# Patient Record
Sex: Female | Born: 1980 | Race: White | Hispanic: No | Marital: Married | Smoking: Never smoker
Health system: Southern US, Community
[De-identification: ages and names within clinical notes are randomized; demographics above are authoritative.]

## PROBLEM LIST (undated history)

## (undated) DIAGNOSIS — R200 Anesthesia of skin: Secondary | ICD-10-CM

## (undated) DIAGNOSIS — B379 Candidiasis, unspecified: Secondary | ICD-10-CM

## (undated) DIAGNOSIS — Z8742 Personal history of other diseases of the female genital tract: Secondary | ICD-10-CM

## (undated) DIAGNOSIS — R109 Unspecified abdominal pain: Secondary | ICD-10-CM

## (undated) DIAGNOSIS — Z3490 Encounter for supervision of normal pregnancy, unspecified, unspecified trimester: Secondary | ICD-10-CM

## (undated) DIAGNOSIS — O039 Complete or unspecified spontaneous abortion without complication: Secondary | ICD-10-CM

## (undated) DIAGNOSIS — Z309 Encounter for contraceptive management, unspecified: Secondary | ICD-10-CM

## (undated) DIAGNOSIS — O26899 Other specified pregnancy related conditions, unspecified trimester: Secondary | ICD-10-CM

## (undated) DIAGNOSIS — K649 Unspecified hemorrhoids: Secondary | ICD-10-CM

## (undated) DIAGNOSIS — IMO0002 Reserved for concepts with insufficient information to code with codable children: Secondary | ICD-10-CM

## (undated) DIAGNOSIS — N898 Other specified noninflammatory disorders of vagina: Secondary | ICD-10-CM

## (undated) DIAGNOSIS — O209 Hemorrhage in early pregnancy, unspecified: Secondary | ICD-10-CM

## (undated) DIAGNOSIS — Z87440 Personal history of urinary (tract) infections: Secondary | ICD-10-CM

## (undated) DIAGNOSIS — R87619 Unspecified abnormal cytological findings in specimens from cervix uteri: Secondary | ICD-10-CM

## (undated) DIAGNOSIS — R87629 Unspecified abnormal cytological findings in specimens from vagina: Secondary | ICD-10-CM

## (undated) HISTORY — DX: Reserved for concepts with insufficient information to code with codable children: IMO0002

## (undated) HISTORY — DX: Other specified noninflammatory disorders of vagina: N89.8

## (undated) HISTORY — DX: Unspecified abnormal cytological findings in specimens from vagina: R87.629

## (undated) HISTORY — PX: APPENDECTOMY: SHX54

## (undated) HISTORY — PX: WISDOM TOOTH EXTRACTION: SHX21

## (undated) HISTORY — DX: Unspecified abnormal cytological findings in specimens from cervix uteri: R87.619

## (undated) HISTORY — DX: Encounter for contraceptive management, unspecified: Z30.9

## (undated) HISTORY — DX: Anesthesia of skin: R20.0

## (undated) HISTORY — DX: Other specified pregnancy related conditions, unspecified trimester: O26.899

## (undated) HISTORY — DX: Unspecified abdominal pain: R10.9

## (undated) HISTORY — DX: Personal history of urinary (tract) infections: Z87.440

## (undated) HISTORY — DX: Hemorrhage in early pregnancy, unspecified: O20.9

## (undated) HISTORY — DX: Personal history of other diseases of the female genital tract: Z87.42

## (undated) HISTORY — DX: Encounter for supervision of normal pregnancy, unspecified, unspecified trimester: Z34.90

## (undated) HISTORY — DX: Candidiasis, unspecified: B37.9

## (undated) HISTORY — DX: Unspecified hemorrhoids: K64.9

## (undated) HISTORY — DX: Complete or unspecified spontaneous abortion without complication: O03.9

---
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
## 2004-09-01 ENCOUNTER — Ambulatory Visit (HOSPITAL_COMMUNITY): Admission: RE | Admit: 2004-09-01 | Discharge: 2004-09-01 | Payer: Self-pay | Admitting: Pediatrics

## 2006-01-16 ENCOUNTER — Ambulatory Visit: Payer: Self-pay | Admitting: Orthopedic Surgery

## 2006-03-13 ENCOUNTER — Ambulatory Visit: Payer: Self-pay | Admitting: Orthopedic Surgery

## 2007-03-25 ENCOUNTER — Other Ambulatory Visit: Admission: RE | Admit: 2007-03-25 | Discharge: 2007-03-25 | Payer: Self-pay | Admitting: Obstetrics and Gynecology

## 2008-03-30 ENCOUNTER — Other Ambulatory Visit: Admission: RE | Admit: 2008-03-30 | Discharge: 2008-03-30 | Payer: Self-pay | Admitting: Obstetrics & Gynecology

## 2008-08-29 ENCOUNTER — Emergency Department (HOSPITAL_COMMUNITY): Admission: EM | Admit: 2008-08-29 | Discharge: 2008-08-29 | Payer: Self-pay | Admitting: Emergency Medicine

## 2009-05-27 ENCOUNTER — Other Ambulatory Visit: Admission: RE | Admit: 2009-05-27 | Discharge: 2009-05-27 | Payer: Self-pay | Admitting: Obstetrics and Gynecology

## 2010-01-02 ENCOUNTER — Ambulatory Visit (HOSPITAL_COMMUNITY): Admission: RE | Admit: 2010-01-02 | Discharge: 2010-01-02 | Payer: Self-pay | Admitting: Pulmonary Disease

## 2010-07-03 LAB — DIFFERENTIAL
Basophils Absolute: 0 10*3/uL (ref 0.0–0.1)
Basophils Relative: 0 % (ref 0–1)
Eosinophils Relative: 0 % (ref 0–5)
Monocytes Absolute: 0.3 10*3/uL (ref 0.1–1.0)

## 2010-07-03 LAB — CBC
HCT: 42.9 % (ref 36.0–46.0)
Hemoglobin: 15.6 g/dL — ABNORMAL HIGH (ref 12.0–15.0)
MCHC: 36.4 g/dL — ABNORMAL HIGH (ref 30.0–36.0)
MCV: 86 fL (ref 78.0–100.0)
RDW: 13.1 % (ref 11.5–15.5)

## 2010-07-03 LAB — BASIC METABOLIC PANEL
CO2: 23 mEq/L (ref 19–32)
Glucose, Bld: 105 mg/dL — ABNORMAL HIGH (ref 70–99)
Potassium: 3.4 mEq/L — ABNORMAL LOW (ref 3.5–5.1)
Sodium: 135 mEq/L (ref 135–145)

## 2010-07-03 LAB — URINALYSIS, ROUTINE W REFLEX MICROSCOPIC
Bilirubin Urine: NEGATIVE
Hgb urine dipstick: NEGATIVE
Specific Gravity, Urine: 1.02 (ref 1.005–1.030)
Urobilinogen, UA: 0.2 mg/dL (ref 0.0–1.0)
pH: 7 (ref 5.0–8.0)

## 2011-06-05 ENCOUNTER — Other Ambulatory Visit: Payer: Self-pay | Admitting: Adult Health

## 2011-06-05 ENCOUNTER — Other Ambulatory Visit (HOSPITAL_COMMUNITY)
Admission: RE | Admit: 2011-06-05 | Discharge: 2011-06-05 | Disposition: A | Discharge status: Home / Self Care | Payer: BC Managed Care – PPO | Source: Ambulatory Visit | Attending: Obstetrics and Gynecology | Admitting: Obstetrics and Gynecology

## 2011-06-05 DIAGNOSIS — Z113 Encounter for screening for infections with a predominantly sexual mode of transmission: Secondary | ICD-10-CM | POA: Insufficient documentation

## 2011-06-05 DIAGNOSIS — Z01419 Encounter for gynecological examination (general) (routine) without abnormal findings: Secondary | ICD-10-CM | POA: Insufficient documentation

## 2012-06-15 ENCOUNTER — Encounter: Payer: Self-pay | Admitting: Adult Health

## 2012-06-15 DIAGNOSIS — Z87898 Personal history of other specified conditions: Secondary | ICD-10-CM

## 2012-06-15 DIAGNOSIS — R87619 Unspecified abnormal cytological findings in specimens from cervix uteri: Secondary | ICD-10-CM | POA: Insufficient documentation

## 2012-06-16 ENCOUNTER — Ambulatory Visit (INDEPENDENT_AMBULATORY_CARE_PROVIDER_SITE_OTHER): Payer: BC Managed Care – PPO | Admitting: Adult Health

## 2012-06-16 ENCOUNTER — Encounter: Payer: Self-pay | Admitting: Adult Health

## 2012-06-16 ENCOUNTER — Other Ambulatory Visit (HOSPITAL_COMMUNITY)
Admission: RE | Admit: 2012-06-16 | Discharge: 2012-06-16 | Disposition: A | Discharge status: Home / Self Care | Payer: BC Managed Care – PPO | Source: Ambulatory Visit | Attending: Obstetrics and Gynecology | Admitting: Obstetrics and Gynecology

## 2012-06-16 ENCOUNTER — Other Ambulatory Visit: Payer: Self-pay | Admitting: Adult Health

## 2012-06-16 VITALS — BP 118/76 | Ht 64.0 in | Wt 140.0 lb

## 2012-06-16 DIAGNOSIS — Z309 Encounter for contraceptive management, unspecified: Secondary | ICD-10-CM

## 2012-06-16 DIAGNOSIS — Z01419 Encounter for gynecological examination (general) (routine) without abnormal findings: Secondary | ICD-10-CM

## 2012-06-16 DIAGNOSIS — Z1151 Encounter for screening for human papillomavirus (HPV): Secondary | ICD-10-CM | POA: Insufficient documentation

## 2012-06-16 DIAGNOSIS — Z0000 Encounter for general adult medical examination without abnormal findings: Secondary | ICD-10-CM

## 2012-06-16 DIAGNOSIS — Z87898 Personal history of other specified conditions: Secondary | ICD-10-CM

## 2012-06-16 DIAGNOSIS — R87810 Cervical high risk human papillomavirus (HPV) DNA test positive: Secondary | ICD-10-CM | POA: Insufficient documentation

## 2012-06-16 HISTORY — DX: Encounter for contraceptive management, unspecified: Z30.9

## 2012-06-16 MED ORDER — NORGESTIMATE-ETH ESTRADIOL 0.25-35 MG-MCG PO TABS: 1.0000 | ORAL_TABLET | Freq: Every day | ORAL | Status: DC

## 2012-06-16 NOTE — Progress Notes (Signed)
Subjective:     Patient ID: Emily Chan, female   DOB: 15-Sep-1980, 32 y.o.   MRN: [id number]  HPI Emily Chan is a 32 year old white female single, gravida 0 para 0. In today for Pap and physical, and refill her birth control pills.   Review of Systems Emily Chan denies any headache, blurred vision, shortness of breath, chest pain, abdominal pain, problems with bowel movements, urination or intercourse. She denies any musculoskeletal pain. No mood changes. Reviewed past medical, surgical, social and family histories. Reviewed medications and allergies.     Objective:   Physical Exam Vital signs: Blood pressure 118/76, height 5 foot 4 inches, weight 140 lbs. BMI:24.03 .Last menstrual period last week. Skin: Warm and dry EENT:PERRL,No hearing loss,normal nares Neck: Midline trachea. Thyroid normal Lungs: Clear to auscultation bilaterally. Breasts: No dominant palpable mass, retraction, or nipple discharge. Cardiovascular: Regular rate and rhythm. Abdomen: Soft and non-tender, no hepatosplenomegaly. Pelvic: External genitalia is normal and appearance. The vagina is normal in appearance. Cervix is nulliparous with negative cervical motion tenderness. Uterus felt to be normal size shape and contour. No adnexal masses or tenderness noted.Extremities: No swelling or varicosiites noted.Psych.: No mood changes.    Assessment:     Yearly Exam Contraceptive Management History of abnormal Pap    Plan:     Continue Sprintec  Follow up 1 year for physical

## 2012-06-16 NOTE — Patient Instructions (Addendum)
Continue birth control pill, follow up 1 year for physical, sign up for my chart

## 2012-06-20 ENCOUNTER — Telehealth: Payer: Self-pay | Admitting: Adult Health

## 2012-06-20 NOTE — Telephone Encounter (Signed)
Called Emily Chan and told her that her pap smear showed the HPV virus and that she needed to have a pap next year and repeat the HPV again then.

## 2012-12-25 ENCOUNTER — Encounter: Payer: Self-pay | Admitting: Adult Health

## 2012-12-25 ENCOUNTER — Ambulatory Visit (INDEPENDENT_AMBULATORY_CARE_PROVIDER_SITE_OTHER): Payer: BC Managed Care – PPO | Admitting: Adult Health

## 2012-12-25 VITALS — BP 110/66 | Ht 64.0 in | Wt 139.0 lb

## 2012-12-25 DIAGNOSIS — N899 Noninflammatory disorder of vagina, unspecified: Secondary | ICD-10-CM

## 2012-12-25 DIAGNOSIS — B379 Candidiasis, unspecified: Secondary | ICD-10-CM | POA: Insufficient documentation

## 2012-12-25 DIAGNOSIS — N898 Other specified noninflammatory disorders of vagina: Secondary | ICD-10-CM

## 2012-12-25 HISTORY — DX: Candidiasis, unspecified: B37.9

## 2012-12-25 HISTORY — DX: Other specified noninflammatory disorders of vagina: N89.8

## 2012-12-25 LAB — POCT WET PREP (WET MOUNT)

## 2012-12-25 MED ORDER — NYSTATIN-TRIAMCINOLONE 100000-0.1 UNIT/GM-% EX OINT: TOPICAL_OINTMENT | Freq: Two times a day (BID) | CUTANEOUS | Status: DC

## 2012-12-25 MED ORDER — FLUCONAZOLE 150 MG PO TABS: ORAL_TABLET | ORAL | Status: DC

## 2012-12-25 NOTE — Progress Notes (Signed)
Subjective:     Patient ID: Emily Chan, female   DOB: 09-Dec-1980, 32 y.o.   MRN: [id number]  HPI Emily Chan is a 32 year old white female in complaining of vaginal irritation since Sunday when used different soap.Has been red and irritated, but better today.  Review of Systems See HPI Reviewed past medical,surgical, social and family history. Reviewed medications and allergies.     Objective:   Physical Exam BP 110/66  Ht 5\' 4"  (1.626 m)  Wt 139 lb (63.05 kg)  BMI 23.85 kg/m2  LMP 12/08/2012   Skin warm and dry.Pelvic: external genitalia is pale red in appearance, vagina: white discharge without odor, cervix:smooth, uterus: normal size, shape and contour, non tender, no masses felt, adnexa: no masses or tenderness noted. Wet prep: + for few yeast and +WBCs.   Assessment:     Vaginal irritation Yeast    Plan:      Rx diflucan 150 mg 1 now and 1 in 3 days if needed Rx mytrex cream use bid prn Follow up prn Review handout on yeast   Go back to old soap

## 2012-12-25 NOTE — Patient Instructions (Addendum)
Monilial Vaginitis Vaginitis in a soreness, swelling and redness (inflammation) of the vagina and vulva. Monilial vaginitis is not a sexually transmitted infection. CAUSES  Yeast vaginitis is caused by yeast (candida) that is normally found in your vagina. With a yeast infection, the candida has overgrown in number to a point that upsets the chemical balance. SYMPTOMS   White, thick vaginal discharge.  Swelling, itching, redness and irritation of the vagina and possibly the lips of the vagina (vulva).  Burning or painful urination.  Painful intercourse. DIAGNOSIS  Things that may contribute to monilial vaginitis are:  Postmenopausal and virginal states.  Pregnancy.  Infections.  Being tired, sick or stressed, especially if you had monilial vaginitis in the past.  Diabetes. Good control will help lower the chance.  Birth control pills.  Tight fitting garments.  Using bubble bath, feminine sprays, douches or deodorant tampons.  Taking certain medications that kill germs (antibiotics).  Sporadic recurrence can occur if you become ill. TREATMENT  Your caregiver will give you medication.  There are several kinds of anti monilial vaginal creams and suppositories specific for monilial vaginitis. For recurrent yeast infections, use a suppository or cream in the vagina 2 times a week, or as directed.  Anti-monilial or steroid cream for the itching or irritation of the vulva may also be used. Get your caregiver's permission.  Painting the vagina with methylene blue solution may help if the monilial cream does not work.  Eating yogurt may help prevent monilial vaginitis. HOME CARE INSTRUCTIONS   Finish all medication as prescribed.  Do not have sex until treatment is completed or after your caregiver tells you it is okay.  Take warm sitz baths.  Do not douche.  Do not use tampons, especially scented ones.  Wear cotton underwear.  Avoid tight pants and panty  hose.  Tell your sexual partner that you have a yeast infection. They should go to their caregiver if they have symptoms such as mild rash or itching.  Your sexual partner should be treated as well if your infection is difficult to eliminate.  Practice safer sex. Use condoms.  Some vaginal medications cause latex condoms to fail. Vaginal medications that harm condoms are:  Cleocin cream.  Butoconazole (Femstat).  Terconazole (Terazol) vaginal suppository.  Miconazole (Monistat) (may be purchased over the counter). SEEK MEDICAL CARE IF:   You have a temperature by mouth above 102 F (38.9 C).  The infection is getting worse after 2 days of treatment.  The infection is not getting better after 3 days of treatment.  You develop blisters in or around your vagina.  You develop vaginal bleeding, and it is not your menstrual period.  You have pain when you urinate.  You develop intestinal problems.  You have pain with sexual intercourse. Document Released: 12/20/2004 Document Revised: 06/04/2011 Document Reviewed: 09/03/2008 [facility] Patient Information 2014 [place], [place]. F/u prn

## 2013-03-22 ENCOUNTER — Ambulatory Visit (INDEPENDENT_AMBULATORY_CARE_PROVIDER_SITE_OTHER): Payer: BC Managed Care – PPO | Admitting: Family Medicine

## 2013-03-22 VITALS — BP 120/80 | HR 94 | Temp 98.7°F | Resp 16 | Ht 64.0 in | Wt 142.0 lb

## 2013-03-22 DIAGNOSIS — J029 Acute pharyngitis, unspecified: Secondary | ICD-10-CM

## 2013-03-22 DIAGNOSIS — R6889 Other general symptoms and signs: Secondary | ICD-10-CM

## 2013-03-22 DIAGNOSIS — J101 Influenza due to other identified influenza virus with other respiratory manifestations: Secondary | ICD-10-CM

## 2013-03-22 LAB — POCT INFLUENZA A/B
Influenza A, POC: POSITIVE
Influenza B, POC: NEGATIVE

## 2013-03-22 MED ORDER — HYDROCOD POLST-CHLORPHEN POLST 10-8 MG/5ML PO LQCR: 5.0000 mL | Freq: Two times a day (BID) | ORAL | Status: DC | PRN

## 2013-03-22 MED ORDER — OSELTAMIVIR PHOSPHATE 75 MG PO CAPS: 75.0000 mg | ORAL_CAPSULE | Freq: Two times a day (BID) | ORAL | Status: DC

## 2013-03-22 MED ORDER — IPRATROPIUM BROMIDE 0.03 % NA SOLN: 2.0000 | Freq: Four times a day (QID) | NASAL | Status: DC

## 2013-03-22 NOTE — Patient Instructions (Signed)

## 2013-03-22 NOTE — Progress Notes (Signed)
Subjective:  This chart was scribed for Norberto Sorenson, MD by Carl Best, Medical Scribe. This patient was seen in Room 4 and the patient's care was started at 1:33 PM.   Patient ID: Emily Chan, female    DOB: [birth year], 32 y.o.   MRN: [id number] Chief Complaint  Patient presents with  . Sore Throat    x 2 days    HPI HPI Comments: Emily Chan is a 32 y.o. female who presents to the [facility] complaining of constant right-sided sore throat that started two nights ago.  She states that her symptoms started with rhinorrhea and a scratchy throat.  She states that she had a 99 degree fever yesterday which has subsided.  She lists productive cough, headache, decreased appetite and myalgias as associated symptoms.  She denies chills, nausea, emesis as associated symptoms and is tol fluids well, nml GU.  She states that she has been taking Nyquil, Dayquil, and Ibuprofen with mild relief to her symptoms.    The patient states that she has been in contact with people with strep throat.  She denies obtaining her flu shot this year.    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   She states that she has Valtrex at home as gets cold sores with illnesses freq.  Past Medical History  Diagnosis Date  . Abnormal Pap smear   . Hx: UTI (urinary tract infection)   . Contraception management 06/16/2012  . Vaginal irritation 12/25/2012  . Yeast infection 12/25/2012   Past Surgical History  Procedure Laterality Date  . Appendectomy     Family History  Problem Relation Age of Onset  . Cancer Paternal Aunt     melenoma  . Diabetes Maternal Grandmother   . Hypertension Maternal Grandmother   . Cancer Maternal Grandfather     Brain and Lung  . Diabetes Maternal Grandfather   . Cancer Paternal Grandmother     breast   History   Social History  . Marital Status: Single    Spouse Name: N/A    Number of Children: N/A  . Years of Education: N/A     Occupational History  . Not on file.   Social History Main Topics  . Smoking status: Never Smoker   . Smokeless tobacco: Never Used  . Alcohol Use: Yes     Comment: occ.  . Drug Use: No  . Sexual Activity: Yes    Birth Control/ Protection: Pill   Other Topics Concern  . Not on file   Social History Narrative  . No narrative on file   Allergies  Allergen Reactions  . Sulfa Antibiotics      Review of Systems  Constitutional: Positive for fever and appetite change (decreased). Negative for chills.  HENT: Positive for congestion, rhinorrhea and sore throat. Negative for ear pain, sinus pressure, sneezing and trouble swallowing.   Respiratory: Positive for cough.   Gastrointestinal: Negative for nausea and vomiting.  Genitourinary: Negative for dysuria, urgency, decreased urine volume and difficulty urinating.  Musculoskeletal: Positive for myalgias. Negative for arthralgias.  Neurological: Positive for headaches.  Hematological: Positive for adenopathy.  Psychiatric/Behavioral: Positive for sleep disturbance.    BP 120/80  Pulse 94  Temp(Src) 98.7 F (37.1 C) (Oral)  Resp 16  Ht 5\' 4"  (1.626 m)  Wt 142 lb (64.411 kg)  BMI 24.36 kg/m2  SpO2 100%  LMP 03/01/2013 Objective:  Physical Exam  Nursing note and  vitals reviewed. Constitutional: She is oriented to person, place, and time. She appears well-developed and well-nourished. No distress.  HENT:  Head: Normocephalic and atraumatic.  Right Ear: External ear and ear canal normal. Tympanic membrane is erythematous and bulging. A middle ear effusion is present.  Left Ear: Tympanic membrane, external ear and ear canal normal.  Mouth/Throat: Uvula is midline. Posterior oropharyngeal erythema present.  Eyes: Conjunctivae and EOM are normal. Pupils are equal, round, and reactive to light.  Neck: Normal range of motion. Neck supple. No thyromegaly present.  Cardiovascular: Normal rate, regular rhythm and normal heart  sounds.  Exam reveals no gallop and no friction rub.   No murmur heard. Pulmonary/Chest: Effort normal and breath sounds normal. No respiratory distress. She has no wheezes. She has no rhonchi. She has no rales.  Lymphadenopathy:    She has cervical adenopathy.       Right cervical: Superficial cervical adenopathy present.       Left cervical: Superficial cervical adenopathy present.       Right: No supraclavicular adenopathy present.       Left: No supraclavicular adenopathy present.  Right superior cervical adenopathy is worse than th left.   Neurological: She is alert and oriented to person, place, and time.  Skin: Skin is warm and dry. No rash noted.  Psychiatric: She has a normal mood and affect. Her behavior is normal.    Results for orders placed in visit on 03/22/13  POCT INFLUENZA A/B      Result Value Range   Influenza A, POC Positive     Influenza B, POC Negative    POCT RAPID STREP A (OFFICE)      Result Value Range   Rapid Strep A Screen Negative  Negative      Assessment & Plan:   Sore throat - Plan: POCT Influenza A/B, POCT rapid strep A  Flu-like symptoms - Plan: POCT Influenza A/B, POCT rapid strep A  Influenza A  Meds ordered this encounter  Medications  . oseltamivir (TAMIFLU) 75 MG capsule    Sig: Take 1 capsule (75 mg total) by mouth 2 (two) times daily.    Dispense:  10 capsule    Refill:  0  . chlorpheniramine-HYDROcodone (TUSSIONEX PENNKINETIC ER) 10-8 MG/5ML LQCR    Sig: Take 5 mLs by mouth every 12 (twelve) hours as needed.    Dispense:  120 mL    Refill:  0  . ipratropium (ATROVENT) 0.03 % nasal spray    Sig: Place 2 sprays into the nose 4 (four) times daily.    Dispense:  30 mL    Refill:  1    I personally performed the services described in this documentation, which was scribed in my presence. The recorded information has been reviewed and considered, and addended by me as needed.  Norberto Sorenson, MD MPH

## 2013-06-29 ENCOUNTER — Other Ambulatory Visit (HOSPITAL_COMMUNITY)
Admission: RE | Admit: 2013-06-29 | Discharge: 2013-06-29 | Disposition: A | Discharge status: Home / Self Care | Payer: BC Managed Care – PPO | Source: Ambulatory Visit | Attending: Adult Health | Admitting: Adult Health

## 2013-06-29 ENCOUNTER — Ambulatory Visit (INDEPENDENT_AMBULATORY_CARE_PROVIDER_SITE_OTHER): Payer: BC Managed Care – PPO | Admitting: Adult Health

## 2013-06-29 ENCOUNTER — Encounter: Payer: Self-pay | Admitting: Adult Health

## 2013-06-29 ENCOUNTER — Encounter (INDEPENDENT_AMBULATORY_CARE_PROVIDER_SITE_OTHER): Payer: Self-pay

## 2013-06-29 VITALS — BP 120/80 | HR 74 | Ht 63.0 in | Wt 143.0 lb

## 2013-06-29 DIAGNOSIS — Z01419 Encounter for gynecological examination (general) (routine) without abnormal findings: Secondary | ICD-10-CM

## 2013-06-29 DIAGNOSIS — Z309 Encounter for contraceptive management, unspecified: Secondary | ICD-10-CM

## 2013-06-29 DIAGNOSIS — Z1151 Encounter for screening for human papillomavirus (HPV): Secondary | ICD-10-CM | POA: Insufficient documentation

## 2013-06-29 MED ORDER — NORGESTIMATE-ETH ESTRADIOL 0.25-35 MG-MCG PO TABS: 1.0000 | ORAL_TABLET | Freq: Every day | ORAL | Status: DC

## 2013-06-29 NOTE — Patient Instructions (Signed)
Physical in 2 years Mammogram at 27 Call with any questions

## 2013-06-29 NOTE — Progress Notes (Signed)
Patient ID: Emily Chan, female   DOB: 1980-10-26, 33 y.o.   MRN: [id number] History of Present Illness: Emily Chan is a 33 year old white female, single in for a pap and physical, had +HPV last year.Has history of abnormal pap with negative colpo in 2013.   Current Medications, Allergies, Past Medical History, Past Surgical History, Family History and Social History were reviewed in [employer] record.     Review of Systems: Patient denies any headaches, blurred vision, shortness of breath, chest pain, abdominal pain, problems with bowel movements, urination, or intercourse. No joint pain or mood swings, has had some BTB mid pack and some occasional breast tenderness, has had flu this year.    Physical Exam:BP 120/80  Pulse 74  Ht 5\' 3"  (1.6 m)  Wt 143 lb (64.864 kg)  BMI 25.34 kg/m2  LMP 06/22/2013 General:  Well developed, well nourished, no acute distress Skin:  Warm and dry Neck:  Midline trachea, normal thyroid Lungs; Clear to auscultation bilaterally Breast:  No dominant palpable mass, retraction, or nipple discharge Cardiovascular: Regular rate and rhythm Abdomen:  Soft, non tender, no hepatosplenomegaly Pelvic:  External genitalia is normal in appearance.  The vagina is normal in appearance. The cervix is friable with EC brush, pap performed with HPV.Marland Kitchen  Uterus is felt to be normal size, shape, and contour.  No  adnexal masses or tenderness noted. Extremities:  No swelling or varicosities noted Psych:  No mood changes Discussed BTB and will continue sprintec for now.  Impression: Yearly gyn exam Contraceptive management    Plan: Physical in 2 years if doing well Refilled sprintec x 1 year Mammogram at 41 Call prn

## 2013-07-02 ENCOUNTER — Telehealth: Payer: Self-pay | Admitting: Adult Health

## 2013-07-02 NOTE — Telephone Encounter (Signed)
Pt aware of abnormal pap and need for colpo, will make appt 

## 2013-07-16 ENCOUNTER — Encounter: Payer: Self-pay | Admitting: Obstetrics & Gynecology

## 2013-07-16 ENCOUNTER — Ambulatory Visit (INDEPENDENT_AMBULATORY_CARE_PROVIDER_SITE_OTHER): Payer: BC Managed Care – PPO | Admitting: Emergency Medicine

## 2013-07-16 ENCOUNTER — Ambulatory Visit (INDEPENDENT_AMBULATORY_CARE_PROVIDER_SITE_OTHER): Payer: BC Managed Care – PPO | Admitting: Obstetrics & Gynecology

## 2013-07-16 VITALS — BP 104/66 | Ht 63.0 in | Wt 145.0 lb

## 2013-07-16 VITALS — BP 110/60 | HR 70 | Temp 98.0°F | Resp 16 | Ht 63.5 in | Wt 144.6 lb

## 2013-07-16 DIAGNOSIS — S91109A Unspecified open wound of unspecified toe(s) without damage to nail, initial encounter: Secondary | ICD-10-CM

## 2013-07-16 DIAGNOSIS — N870 Mild cervical dysplasia: Secondary | ICD-10-CM

## 2013-07-16 DIAGNOSIS — Z3202 Encounter for pregnancy test, result negative: Secondary | ICD-10-CM

## 2013-07-16 LAB — POCT URINE PREGNANCY: Preg Test, Ur: NEGATIVE

## 2013-07-16 MED ORDER — CEPHALEXIN 500 MG PO CAPS: 500.0000 mg | ORAL_CAPSULE | Freq: Three times a day (TID) | ORAL | Status: DC

## 2013-07-16 NOTE — Progress Notes (Signed)
° °  Subjective:    Patient ID: Emily Chan, female    DOB: 04-Mar-1981, 33 y.o.   MRN: [id number] This chart was scribed for Arlyss Queen, MD by Rolanda Lundborg, ED Scribe. This patient was seen in room 12 and the patient's care was started at 10:30 AM.  Chief Complaint  Patient presents with   left great toe infected    HPI HPI Comments: Emily Chan is a 33 y.o. female who presents to the [facility] complaining of intermittent throbbing left great toe pain since dropping a cutting board on the toe one week ago. She states the corner of the board hit the toenail. She reports clear drainage from the base of the toenail. She is UTD on her tetanus vaccine.  PCP - Alonza Bogus, MD  Past Medical History  Diagnosis Date   Abnormal Pap smear    Hx: UTI (urinary tract infection)    Contraception management 06/16/2012   Vaginal irritation 12/25/2012   Yeast infection 12/25/2012   Vaginal Pap smear, abnormal    Current Outpatient Prescriptions on File Prior to Visit  Medication Sig Dispense Refill   norgestimate-ethinyl estradiol ([place] 28) 0.25-35 MG-MCG tablet Take 1 tablet by mouth daily.  1 Package  12   No current facility-administered medications on file prior to visit.   Allergies  Allergen Reactions   Sulfa Antibiotics       Review of Systems  Constitutional: Negative for fever and chills.  Neurological: Negative for numbness.       Objective:   Physical Exam CONSTITUTIONAL: Well developed/well nourished HEAD: Normocephalic/atraumatic EYES: EOMI/PERRL ENMT: Mucous membranes moist SPINE:entire spine nontender CV: S1/S2 noted, no murmurs/rubs/gallops noted LUNGS: Lungs are clear to auscultation bilaterally, no apparent distress NEURO: Pt is awake/alert, moves all extremitiesx4 EXTREMITIES: pulses normal, full ROM. Base of the left great toenail is freed up form the soft tissue with a mildly discolored drainage. Redness of the  soft tissue around the base of the nail. SKIN: warm, color normal PSYCH: no abnormalities of mood noted  Filed Vitals:   07/16/13 1001  BP: 110/60  Pulse: 70  Temp: 98 F (36.7 C)  TempSrc: Oral  Resp: 16  Height: 5' 3.5" (1.613 m)  Weight: 144 lb 9.6 oz (65.59 kg)  SpO2: 98%         Assessment & Plan:  **Disclaimer: This note was dictated with voice recognition software. Similar sounding words can inadvertently be transcribed and this note may contain transcription errors which may not have been corrected upon publication of note.**   We'll treat with cephalexin 3 times a day for 7 days I personally performed the services described in this documentation, which was scribed in my presence. The recorded information has been reviewed and is accurate.

## 2013-07-16 NOTE — Patient Instructions (Signed)
Wound Care Wound care helps prevent pain and infection.  You may need a tetanus shot if:  You cannot remember when you had your last tetanus shot.  You have never had a tetanus shot.  The injury broke your skin. If you need a tetanus shot and you choose not to have one, you may get tetanus. Sickness from tetanus can be serious. HOME CARE   Only take medicine as told by your doctor.  Clean the wound daily with mild soap and water.  Change any bandages (dressings) as told by your doctor.  Put medicated cream and a bandage on the wound as told by your doctor.  Change the bandage if it gets wet, dirty, or starts to smell.  Take showers. Do not take baths, swim, or do anything that puts your wound under water.  Rest and raise (elevate) the wound until the pain and puffiness (swelling) are better.  Keep all doctor visits as told. GET HELP RIGHT AWAY IF:   Yellowish-white fluid (pus) comes from the wound.  Medicine does not lessen your pain.  There is a red streak going away from the wound.  You have a fever. MAKE SURE YOU:   Understand these instructions.  Will watch your condition.  Will get help right away if you are not doing well or get worse. Document Released: 12/20/2007 Document Revised: 06/04/2011 Document Reviewed: 07/16/2010 [employer] Patient Information 2014 [employer].  

## 2013-07-16 NOTE — Progress Notes (Signed)
Patient ID: Emily Chan, female   DOB: [birth year], 33 y.o.   MRN: [id number] Pap LSIL negative HPV  Colposcopy: Adequate +acetowhite changes +punctation +Mosaicism No abnormal vessels  Biopsy taken  Imp LSIL  Follow up 1 week  Past Medical History  Diagnosis Date  . Abnormal Pap smear   . Hx: UTI (urinary tract infection)   . Contraception management 06/16/2012  . Vaginal irritation 12/25/2012  . Yeast infection 12/25/2012  . Vaginal Pap smear, abnormal     Past Surgical History  Procedure Laterality Date  . Appendectomy    . Wisdom tooth extraction      OB History   Grav Para Term Preterm Abortions TAB SAB Ect Mult Living                  Allergies  Allergen Reactions  . Sulfa Antibiotics     History   Social History  . Marital Status: Single    Spouse Name: N/A    Number of Children: N/A  . Years of Education: N/A   Social History Main Topics  . Smoking status: Never Smoker   . Smokeless tobacco: Never Used  . Alcohol Use: Yes     Comment: occ.  . Drug Use: No  . Sexual Activity: Yes    Birth Control/ Protection: Pill   Other Topics Concern  . None   Social History Narrative  . None    Family History  Problem Relation Age of Onset  . Cancer Paternal Aunt     melenoma  . Diabetes Maternal Grandmother   . Hypertension Maternal Grandmother   . Cancer Maternal Grandfather     Brain and Lung  . Diabetes Maternal Grandfather   . Cancer Paternal Grandmother     breast

## 2013-07-18 ENCOUNTER — Encounter: Payer: Self-pay | Admitting: *Deleted

## 2013-07-18 LAB — WOUND CULTURE: GRAM STAIN: NONE SEEN

## 2013-07-23 ENCOUNTER — Ambulatory Visit (INDEPENDENT_AMBULATORY_CARE_PROVIDER_SITE_OTHER): Payer: BC Managed Care – PPO | Admitting: Obstetrics & Gynecology

## 2013-07-23 ENCOUNTER — Encounter: Payer: Self-pay | Admitting: Advanced Practice Midwife

## 2013-07-23 VITALS — BP 110/70 | Ht 63.0 in | Wt 144.0 lb

## 2013-07-23 DIAGNOSIS — N870 Mild cervical dysplasia: Secondary | ICD-10-CM

## 2013-07-23 NOTE — Progress Notes (Signed)
Patient ID: Emily Chan, female   DOB: 1980-06-07, 33 y.o.   MRN: [id number] Cervicval biopsy LSIL/CIN I  Follow up Pap 1 year  Past Medical History  Diagnosis Date  . Abnormal Pap smear   . Hx: UTI (urinary tract infection)   . Contraception management 06/16/2012  . Vaginal irritation 12/25/2012  . Yeast infection 12/25/2012  . Vaginal Pap smear, abnormal     Past Surgical History  Procedure Laterality Date  . Appendectomy    . Wisdom tooth extraction      OB History   Grav Para Term Preterm Abortions TAB SAB Ect Mult Living                  Allergies  Allergen Reactions  . Sulfa Antibiotics     History   Social History  . Marital Status: Single    Spouse Name: N/A    Number of Children: N/A  . Years of Education: N/A   Social History Main Topics  . Smoking status: Never Smoker   . Smokeless tobacco: Never Used  . Alcohol Use: Yes     Comment: occ.  . Drug Use: No  . Sexual Activity: Yes    Birth Control/ Protection: Pill   Other Topics Concern  . None   Social History Narrative  . None    Family History  Problem Relation Age of Onset  . Cancer Paternal Aunt     melenoma  . Diabetes Maternal Grandmother   . Hypertension Maternal Grandmother   . Cancer Maternal Grandfather     Brain and Lung  . Diabetes Maternal Grandfather   . Cancer Paternal Grandmother     breast

## 2013-07-27 ENCOUNTER — Ambulatory Visit: Payer: BC Managed Care – PPO | Admitting: Obstetrics & Gynecology

## 2013-11-19 ENCOUNTER — Ambulatory Visit: Payer: BC Managed Care – PPO | Admitting: Adult Health

## 2013-12-30 ENCOUNTER — Ambulatory Visit (INDEPENDENT_AMBULATORY_CARE_PROVIDER_SITE_OTHER): Payer: BC Managed Care – PPO | Admitting: Family Medicine

## 2013-12-30 VITALS — BP 112/70 | HR 72 | Temp 97.6°F | Resp 18 | Ht 64.0 in | Wt 148.0 lb

## 2013-12-30 DIAGNOSIS — N3000 Acute cystitis without hematuria: Secondary | ICD-10-CM

## 2013-12-30 DIAGNOSIS — R300 Dysuria: Secondary | ICD-10-CM

## 2013-12-30 LAB — POCT URINALYSIS DIPSTICK
BILIRUBIN UA: NEGATIVE
Glucose, UA: 100
KETONES UA: NEGATIVE
Nitrite, UA: POSITIVE
PH UA: 5.5
Protein, UA: 30
SPEC GRAV UA: 1.02
Urobilinogen, UA: 1

## 2013-12-30 LAB — POCT UA - MICROSCOPIC ONLY
Casts, Ur, LPF, POC: NEGATIVE
Crystals, Ur, HPF, POC: NEGATIVE
MUCUS UA: NEGATIVE
Yeast, UA: NEGATIVE

## 2013-12-30 LAB — GLUCOSE, POCT (MANUAL RESULT ENTRY): POC GLUCOSE: 108 mg/dL — AB (ref 70–99)

## 2013-12-30 MED ORDER — NITROFURANTOIN MONOHYD MACRO 100 MG PO CAPS: 100.0000 mg | ORAL_CAPSULE | Freq: Two times a day (BID) | ORAL | Status: DC

## 2013-12-30 NOTE — Progress Notes (Signed)
[facility] [address],  [place] [address] [phone]  Date:  12/30/2013   Name:  Emily Chan   DOB:  [birth year]   MRN:  [id number]  PCP:  Alonza Bogus, MD    Chief Complaint: Urinary Frequency   History of Present Illness:  Emily Chan is a 33 y.o. very pleasant female patient who presents with the following:  She thinks she may have a UTI for the last couple of days.  She has noted pain with urination, darker urine, urinary frequency.  She has a history of frequent UTI, but this has gotten better over recent years She did take an azo which did help a little bit.   No nausea, vomiting or fever.   She is generally in good health.   Allergic to sulfa but no other allergies.   No vaginal sx that she has noted.      Patient Active Problem List   Diagnosis Date Noted  . Mild dysplasia of cervix 07/16/2013  . Vaginal irritation 12/25/2012  . Yeast infection 12/25/2012  . Contraception management 06/16/2012  . Hx of abnormal Pap smear 06/15/2012    Past Medical History  Diagnosis Date  . Abnormal Pap smear   . Hx: UTI (urinary tract infection)   . Contraception management 06/16/2012  . Vaginal irritation 12/25/2012  . Yeast infection 12/25/2012  . Vaginal Pap smear, abnormal     Past Surgical History  Procedure Laterality Date  . Appendectomy    . Wisdom tooth extraction      History  Substance Use Topics  . Smoking status: Never Smoker   . Smokeless tobacco: Never Used  . Alcohol Use: Yes     Comment: occ.    Family History  Problem Relation Age of Onset  . Cancer Paternal Aunt     melenoma  . Diabetes Maternal Grandmother   . Hypertension Maternal Grandmother   . Cancer Maternal Grandfather     Brain and Lung  . Diabetes Maternal Grandfather   . Cancer Paternal Grandmother     breast    Allergies  Allergen Reactions  . Sulfa Antibiotics     Medication list has been reviewed and updated.  Current  Outpatient Prescriptions on File Prior to Visit  Medication Sig Dispense Refill  . norgestimate-ethinyl estradiol (SPRINTEC 28) 0.25-35 MG-MCG tablet Take 1 tablet by mouth daily.  1 Package  12   No current facility-administered medications on file prior to visit.    Review of Systems:  As per HPI- otherwise negative.   Physical Examination: Filed Vitals:   12/30/13 0814  BP: 112/70  Pulse: 72  Temp: 97.6 F (36.4 C)  Resp: 18   Filed Vitals:   12/30/13 0814  Height: 5\' 4"  (1.626 m)  Weight: 148 lb (67.132 kg)   Body mass index is 25.39 kg/(m^2). Ideal Body Weight: Weight in (lb) to have BMI = 25: 145.3  GEN: WDWN, NAD, Non-toxic, A & O x 3, looks well HEENT: Atraumatic, Normocephalic. Neck supple. No masses, No LAD. Ears and Nose: No external deformity. CV: RRR, No M/G/R. No JVD. No thrill. No extra heart sounds. PULM: CTA B, no wheezes, crackles, rhonchi. No retractions. No resp. distress. No accessory muscle use. ABD: S, NT, ND. No rebound. No HSM. EXTR: No c/c/e NEURO Normal gait.  PSYCH: Normally interactive. Conversant. Not depressed or anxious appearing.  Calm demeanor.  No CVA tenderness   Results for orders placed in visit on  12/30/13  POCT URINALYSIS DIPSTICK      Result Value Ref Range   Color, UA orange     Clarity, UA cloudy     Glucose, UA 100     Bilirubin, UA neg     Ketones, UA neg     Spec Grav, UA 1.020     Blood, UA moderate     pH, UA 5.5     Protein, UA 30     Urobilinogen, UA 1.0     Nitrite, UA positive     Leukocytes, UA large (3+)    POCT UA - MICROSCOPIC ONLY      Result Value Ref Range   WBC, Ur, HPF, POC 20-25     RBC, urine, microscopic 10-15     Bacteria, U Microscopic 4+     Mucus, UA neg     Epithelial cells, urine per micros 5-10     Crystals, Ur, HPF, POC neg     Casts, Ur, LPF, POC neg     Yeast, UA neg    GLUCOSE, POCT (MANUAL RESULT ENTRY)      Result Value Ref Range   POC Glucose 108 (*) 70 - 99 mg/dl      Assessment and Plan: Dysuria - Plan: POCT urinalysis dipstick, POCT UA - Microscopic Only, Urine culture, nitrofurantoin, macrocrystal-monohydrate, (MACROBID) 100 MG capsule, POCT glucose (manual entry)  Acute cystitis without hematuria - Plan: nitrofurantoin, macrocrystal-monohydrate, (MACROBID) 100 MG capsule, POCT glucose (manual entry)  Appearance of glucose in urine due to azo use.  She is fasting today but her glucose is ok.    Appears to have a UTI.  Await culture and will treat with macrobid in the meantime.   See patient instructions for more details.     Signed Lamar Blinks, MD

## 2013-12-30 NOTE — Patient Instructions (Signed)
You can continue azo as needed, and drink plenty of fluids.  Use the macrobid twice a day as directed, and let me know if you do not feel better soon. I will be in touch with your urine culture asap.  Don't forget that antibiotics could interfere with your birth control pill

## 2014-01-01 LAB — URINE CULTURE: Colony Count: >=100000

## 2014-01-04 ENCOUNTER — Encounter: Payer: Self-pay | Admitting: Radiology

## 2014-01-28 ENCOUNTER — Ambulatory Visit (INDEPENDENT_AMBULATORY_CARE_PROVIDER_SITE_OTHER): Payer: BC Managed Care – PPO | Admitting: Adult Health

## 2014-01-28 ENCOUNTER — Encounter: Payer: Self-pay | Admitting: Adult Health

## 2014-01-28 VITALS — BP 120/80 | Ht 64.0 in | Wt 147.0 lb

## 2014-01-28 DIAGNOSIS — Z3490 Encounter for supervision of normal pregnancy, unspecified, unspecified trimester: Secondary | ICD-10-CM

## 2014-01-28 DIAGNOSIS — Z3200 Encounter for pregnancy test, result unknown: Secondary | ICD-10-CM

## 2014-01-28 DIAGNOSIS — Z3202 Encounter for pregnancy test, result negative: Secondary | ICD-10-CM

## 2014-01-28 HISTORY — DX: Encounter for supervision of normal pregnancy, unspecified, unspecified trimester: Z34.90

## 2014-01-28 LAB — POCT URINE PREGNANCY: Preg Test, Ur: POSITIVE

## 2014-01-28 MED ORDER — PRENATAL PLUS 27-1 MG PO TABS: 1.0000 | ORAL_TABLET | Freq: Every day | ORAL | Status: DC

## 2014-01-28 NOTE — Patient Instructions (Signed)
First Trimester of Pregnancy The first trimester of pregnancy is from week 1 until the end of week 12 (months 1 through 3). A week after a sperm fertilizes an egg, the egg will implant on the wall of the uterus. This embryo will begin to develop into a baby. Genes from you and your partner are forming the baby. The female genes determine whether the baby is a boy or a girl. At 6-8 weeks, the eyes and face are formed, and the heartbeat can be seen on ultrasound. At the end of 12 weeks, all the baby's organs are formed.  Now that you are pregnant, you will want to do everything you can to have a healthy baby. Two of the most important things are to get good prenatal care and to follow your health care provider's instructions. Prenatal care is all the medical care you receive before the baby's birth. This care will help prevent, find, and treat any problems during the pregnancy and childbirth. BODY CHANGES Your body goes through many changes during pregnancy. The changes vary from woman to woman.   You may gain or lose a couple of pounds at first.  You may feel sick to your stomach (nauseous) and throw up (vomit). If the vomiting is uncontrollable, call your health care provider.  You may tire easily.  You may develop headaches that can be relieved by medicines approved by your health care provider.  You may urinate more often. Painful urination may mean you have a bladder infection.  You may develop heartburn as a result of your pregnancy.  You may develop constipation because certain hormones are causing the muscles that push waste through your intestines to slow down.  You may develop hemorrhoids or swollen, bulging veins (varicose veins).  Your breasts may begin to grow larger and become tender. Your nipples may stick out more, and the tissue that surrounds them (areola) may become darker.  Your gums may bleed and may be sensitive to brushing and flossing.  Dark spots or blotches (chloasma,  mask of pregnancy) may develop on your face. This will likely fade after the baby is born.  Your menstrual periods will stop.  You may have a loss of appetite.  You may develop cravings for certain kinds of food.  You may have changes in your emotions from day to day, such as being excited to be pregnant or being concerned that something may go wrong with the pregnancy and baby.  You may have more vivid and strange dreams.  You may have changes in your hair. These can include thickening of your hair, rapid growth, and changes in texture. Some women also have hair loss during or after pregnancy, or hair that feels dry or thin. Your hair will most likely return to normal after your baby is born. WHAT TO EXPECT AT YOUR PRENATAL VISITS During a routine prenatal visit:  You will be weighed to make sure you and the baby are growing normally.  Your blood pressure will be taken.  Your abdomen will be measured to track your baby's growth.  The fetal heartbeat will be listened to starting around week 10 or 12 of your pregnancy.  Test results from any previous visits will be discussed. Your health care provider may ask you:  How you are feeling.  If you are feeling the baby move.  If you have had any abnormal symptoms, such as leaking fluid, bleeding, severe headaches, or abdominal cramping.  If you have any questions. Other tests   that may be performed during your first trimester include:  Blood tests to find your blood type and to check for the presence of any previous infections. They will also be used to check for low iron levels (anemia) and Rh antibodies. Later in the pregnancy, blood tests for diabetes will be done along with other tests if problems develop.  Urine tests to check for infections, diabetes, or protein in the urine.  An ultrasound to confirm the proper growth and development of the baby.  An amniocentesis to check for possible genetic problems.  Fetal screens for  spina bifida and Down syndrome.  You may need other tests to make sure you and the baby are doing well. HOME CARE INSTRUCTIONS  Medicines  Follow your health care provider's instructions regarding medicine use. Specific medicines may be either safe or unsafe to take during pregnancy.  Take your prenatal vitamins as directed.  If you develop constipation, try taking a stool softener if your health care provider approves. Diet  Eat regular, well-balanced meals. Choose a variety of foods, such as meat or vegetable-based protein, fish, milk and low-fat dairy products, vegetables, fruits, and whole grain breads and cereals. Your health care provider will help you determine the amount of weight gain that is right for you.  Avoid raw meat and uncooked cheese. These carry germs that can cause birth defects in the baby.  Eating four or five small meals rather than three large meals a day may help relieve nausea and vomiting. If you start to feel nauseous, eating a few soda crackers can be helpful. Drinking liquids between meals instead of during meals also seems to help nausea and vomiting.  If you develop constipation, eat more high-fiber foods, such as fresh vegetables or fruit and whole grains. Drink enough fluids to keep your urine clear or pale yellow. Activity and Exercise  Exercise only as directed by your health care provider. Exercising will help you:  Control your weight.  Stay in shape.  Be prepared for labor and delivery.  Experiencing pain or cramping in the lower abdomen or low back is a good sign that you should stop exercising. Check with your health care provider before continuing normal exercises.  Try to avoid standing for long periods of time. Move your legs often if you must stand in one place for a long time.  Avoid heavy lifting.  Wear low-heeled shoes, and practice good posture.  You may continue to have sex unless your health care provider directs you  otherwise. Relief of Pain or Discomfort  Wear a good support bra for breast tenderness.   Take warm sitz baths to soothe any pain or discomfort caused by hemorrhoids. Use hemorrhoid cream if your health care provider approves.   Rest with your legs elevated if you have leg cramps or low back pain.  If you develop varicose veins in your legs, wear support hose. Elevate your feet for 15 minutes, 3-4 times a day. Limit salt in your diet. Prenatal Care  Schedule your prenatal visits by the twelfth week of pregnancy. They are usually scheduled monthly at first, then more often in the last 2 months before delivery.  Write down your questions. Take them to your prenatal visits.  Keep all your prenatal visits as directed by your health care provider. Safety  Wear your seat belt at all times when driving.  Make a list of emergency phone numbers, including numbers for family, friends, the hospital, and police and fire departments. General Tips    Ask your health care provider for a referral to a local prenatal education class. Begin classes no later than at the beginning of month 6 of your pregnancy.  Ask for help if you have counseling or nutritional needs during pregnancy. Your health care provider can offer advice or refer you to specialists for help with various needs.  Do not use hot tubs, steam rooms, or saunas.  Do not douche or use tampons or scented sanitary pads.  Do not cross your legs for long periods of time.  Avoid cat litter boxes and soil used by cats. These carry germs that can cause birth defects in the baby and possibly loss of the fetus by miscarriage or stillbirth.  Avoid all smoking, herbs, alcohol, and medicines not prescribed by your health care provider. Chemicals in these affect the formation and growth of the baby.  Schedule a dentist appointment. At home, brush your teeth with a soft toothbrush and be gentle when you floss. SEEK MEDICAL CARE IF:   You have  dizziness.  You have mild pelvic cramps, pelvic pressure, or nagging pain in the abdominal area.  You have persistent nausea, vomiting, or diarrhea.  You have a bad smelling vaginal discharge.  You have pain with urination.  You notice increased swelling in your face, hands, legs, or ankles. SEEK IMMEDIATE MEDICAL CARE IF:   You have a fever.  You are leaking fluid from your vagina.  You have spotting or bleeding from your vagina.  You have severe abdominal cramping or pain.  You have rapid weight gain or loss.  You vomit blood or material that looks like coffee grounds.  You are exposed to German measles and have never had them.  You are exposed to fifth disease or chickenpox.  You develop a severe headache.  You have shortness of breath.  You have any kind of trauma, such as from a fall or a car accident. Document Released: 03/06/2001 Document Revised: 07/27/2013 Document Reviewed: 01/20/2013 Exit[employer] Patient Information 2015 [employer]. This information is not intended to replace advice given to you by your health care provider. Make sure you discuss any questions you have with your health care provider. Return in 2 weeks for dating [place] 

## 2014-01-28 NOTE — Progress Notes (Signed)
Subjective:     Patient ID: Emily Chan, female   DOB: [birth year], 33 y.o.   MRN: [id number]  HPI Emily Chan is a 33 year old white female in for UPT,had +HPT.  Review of Systems See HPI Reviewed past medical,surgical, social and family history. Reviewed medications and allergies.      Objective:   Physical Exam BP 120/80 mmHg  Ht 5\' 4"  (1.626 m)  Wt 147 lb (66.679 kg)  BMI 25.22 kg/m2  LMP 12/18/2013 (Exact Date)+UPT about 5+[redacted] weeks pregnant by LMP,EDD 09/24/14, limit lifting to 20-25 lbs no alcohol,drugs or raw fish or meat.    Assessment:     Pregnant     Plan:     Return in 2 weeks for dating [place] Rx prenatal plus #30 1 daily with 11 refills Review handout on first trimester and given OB packet Has form for [facility]

## 2014-02-01 ENCOUNTER — Telehealth: Payer: Self-pay | Admitting: Women's Health

## 2014-02-01 NOTE — Telephone Encounter (Signed)
Pt states over the weekend she had some spotting and some abdominal cramping, she had sex about 2 - 3 days before and has been straining with BMs.  The spotting has resolved but still having some cramping.  Advised her that some spotting can be normal in early pregnancy especially after intercourse and with straining.  She states yesterday her cramping was moderate but had not had a BM in two days, she now has and the cramping is mild.  Advised her to take some Tylenol, push fluids, rest and increase fiber rich foods, if cramping gets worse or starts having bleeding to call us back.  Pt verbalized understanding.

## 2014-02-05 ENCOUNTER — Telehealth: Payer: Self-pay | Admitting: Advanced Practice Midwife

## 2014-02-05 NOTE — Telephone Encounter (Signed)
Spoke with pt. Pt is [redacted] weeks pregnant. Pt has had some spotting, but yesterday it got heavier and red. Now it is brown and lighter. Pt has had no sex in 1 week. Last BM was 2 days ago. Pt is having low abd pain. Advised to push fluids, and take it easy over the weekend. Advised if pain gets worse, or if bleeding gets heavier, call the after hours nurse line, or go to ER. Appt scheduled for Monday. Pt voiced understanding. [place]

## 2014-02-05 NOTE — Telephone Encounter (Signed)
Left message x 1. JSY 

## 2014-02-08 ENCOUNTER — Ambulatory Visit (INDEPENDENT_AMBULATORY_CARE_PROVIDER_SITE_OTHER): Payer: BC Managed Care – PPO

## 2014-02-08 ENCOUNTER — Other Ambulatory Visit: Payer: Self-pay | Admitting: Obstetrics and Gynecology

## 2014-02-08 DIAGNOSIS — O3680X0 Pregnancy with inconclusive fetal viability, not applicable or unspecified: Secondary | ICD-10-CM

## 2014-02-08 NOTE — Progress Notes (Signed)
U/S(7+3wks)-single IUP with +FCA noted, FHR-137 bpm, CRL c/w LMP dates, cx appears closed, bilateral adnexa appears WNL with C.L. Noted on Rt

## 2014-02-11 ENCOUNTER — Other Ambulatory Visit: Payer: BC Managed Care – PPO

## 2014-02-15 ENCOUNTER — Other Ambulatory Visit: Payer: BC Managed Care – PPO

## 2014-02-15 ENCOUNTER — Encounter: Payer: Self-pay | Admitting: Women's Health

## 2014-02-15 ENCOUNTER — Ambulatory Visit (INDEPENDENT_AMBULATORY_CARE_PROVIDER_SITE_OTHER): Payer: BC Managed Care – PPO | Admitting: Women's Health

## 2014-02-15 VITALS — BP 116/64 | Wt 147.0 lb

## 2014-02-15 DIAGNOSIS — R87612 Low grade squamous intraepithelial lesion on cytologic smear of cervix (LGSIL): Secondary | ICD-10-CM

## 2014-02-15 DIAGNOSIS — Z0283 Encounter for blood-alcohol and blood-drug test: Secondary | ICD-10-CM

## 2014-02-15 DIAGNOSIS — Z118 Encounter for screening for other infectious and parasitic diseases: Secondary | ICD-10-CM

## 2014-02-15 DIAGNOSIS — Z3401 Encounter for supervision of normal first pregnancy, first trimester: Secondary | ICD-10-CM

## 2014-02-15 DIAGNOSIS — Z1371 Encounter for nonprocreative screening for genetic disease carrier status: Secondary | ICD-10-CM

## 2014-02-15 DIAGNOSIS — Z1159 Encounter for screening for other viral diseases: Secondary | ICD-10-CM

## 2014-02-15 DIAGNOSIS — Z3682 Encounter for antenatal screening for nuchal translucency: Secondary | ICD-10-CM

## 2014-02-15 DIAGNOSIS — Z3400 Encounter for supervision of normal first pregnancy, unspecified trimester: Secondary | ICD-10-CM | POA: Insufficient documentation

## 2014-02-15 DIAGNOSIS — Z113 Encounter for screening for infections with a predominantly sexual mode of transmission: Secondary | ICD-10-CM

## 2014-02-15 DIAGNOSIS — Z0184 Encounter for antibody response examination: Secondary | ICD-10-CM

## 2014-02-15 DIAGNOSIS — Z1389 Encounter for screening for other disorder: Secondary | ICD-10-CM

## 2014-02-15 DIAGNOSIS — Z331 Pregnant state, incidental: Secondary | ICD-10-CM

## 2014-02-15 DIAGNOSIS — Z114 Encounter for screening for human immunodeficiency virus [HIV]: Secondary | ICD-10-CM

## 2014-02-15 LAB — CBC
HEMATOCRIT: 40.5 % (ref 36.0–46.0)
Hemoglobin: 13.8 g/dL (ref 12.0–15.0)
MCH: 29.3 pg (ref 26.0–34.0)
MCHC: 34.1 g/dL (ref 30.0–36.0)
MCV: 86 fL (ref 78.0–100.0)
MPV: 9.5 fL (ref 9.4–12.4)
PLATELETS: 163 10*3/uL (ref 150–400)
RBC: 4.71 MIL/uL (ref 3.87–5.11)
RDW: 13.8 % (ref 11.5–15.5)
WBC: 7.1 10*3/uL (ref 4.0–10.5)

## 2014-02-15 LAB — POCT URINALYSIS DIPSTICK
Blood, UA: NEGATIVE
GLUCOSE UA: NEGATIVE
Ketones, UA: NEGATIVE
NITRITE UA: NEGATIVE
PROTEIN UA: NEGATIVE

## 2014-02-15 NOTE — Progress Notes (Signed)
  Subjective:  Emily Chan is a 33 y.o. G52P0 Caucasian female at [redacted]w[redacted]d by LMP c/w 6wk u/s, being seen today for her first obstetrical visit.  Her obstetrical history is significant for primigravida.  Pregnancy history fully reviewed.  Patient reports constipation- taking colace prn which is helping. No nausea/vomting. Denies vb, cramping, uti s/s, abnormal/malodorous vag d/c, or vulvovaginal itching/irritation.  BP 116/64 mmHg  Wt 147 lb (66.679 kg)  LMP 12/18/2013 (Exact Date)  HISTORY: OB History  Gravida Para Term Preterm AB SAB TAB Ectopic Multiple Living  1             # Outcome Date GA Lbr Len/2nd Weight Sex Delivery Anes PTL Lv  1 Current              Past Medical History  Diagnosis Date  . Abnormal Pap smear   . Hx: UTI (urinary tract infection)   . Contraception management 06/16/2012  . Vaginal irritation 12/25/2012  . Yeast infection 12/25/2012  . Vaginal Pap smear, abnormal   . Pregnant 01/28/2014   Past Surgical History  Procedure Laterality Date  . Appendectomy    . Wisdom tooth extraction     Family History  Problem Relation Age of Onset  . Cancer Paternal Aunt     melenoma  . Diabetes Maternal Grandmother   . Hypertension Maternal Grandmother   . Cancer Maternal Grandfather     Brain and Lung  . Diabetes Maternal Grandfather   . Cancer Paternal Grandmother     breast    Exam   System:     General: Well developed & nourished, no acute distress   Skin: Warm & dry, normal coloration and turgor, no rashes   Neurologic: Alert & oriented, normal mood   Cardiovascular: Regular rate & rhythm   Respiratory: Effort & rate normal, LCTAB, acyanotic   Abdomen: Soft, non tender   Extremities: normal strength, tone   Thin prep pap smear: LSIL -HRHPV 06/2013 w/ colpo and bx: LSIL/CIN-I, recommended repeat pap 1 yr  FHR: 170 via informal transabdominal u/s   Assessment:   Pregnancy: G1P0 Patient Active Problem List   Diagnosis Date Noted  . Supervision  of normal first pregnancy 02/15/2014    Priority: High  . Mild dysplasia of cervix 07/16/2013  . Yeast infection 12/25/2012  . Abnormal Pap smear of cervix 06/15/2012    [redacted]w[redacted]d G1P0 New OB visit Constipation Mild cervical dysplasia   Plan:  Initial labs drawn Continue prenatal vitamins Problem list reviewed and updated Reviewed n/v relief measures and warning s/s to report Reviewed recommended weight gain based on pre-gravid BMI Encouraged well-balanced diet Genetic Screening discussed Integrated Screen: requested Cystic fibrosis screening discussed requested Ultrasound discussed; fetal survey: requested Follow up in 4 weeks for 1st IT/NT and visit [name] completed Gave printed info on constipation Repeat pap in April or pp  Tawnya Crook CNM, [facility] 02/15/2014 10:39 AM

## 2014-02-15 NOTE — Patient Instructions (Signed)
Constipation  Drink plenty of fluid, preferably water, throughout the day  Eat foods high in fiber such as fruits, vegetables, and grains  Exercise, such as walking, is a good way to keep your bowels regular  Drink warm fluids, especially warm prune juice, or decaf coffee  Eat a 1/2 cup of real oatmeal (not instant), 1/2 cup applesauce, and 1/2-1 cup warm prune juice every day  If needed, you may take Colace (docusate sodium) stool softener once or twice a day to help keep the stool soft. If you are pregnant, wait until you are out of your first trimester (12-14 weeks of pregnancy)  If you still are having problems with constipation, you may take Miralax once daily as needed to help keep your bowels regular.  If you are pregnant, wait until you are out of your first trimester (12-14 weeks of pregnancy)   Nausea & Vomiting  Have saltine crackers or pretzels by your bed and eat a few bites before you raise your head out of bed in the morning  Eat small frequent meals throughout the day instead of large meals  Drink plenty of fluids throughout the day to stay hydrated, just don't drink a lot of fluids with your meals.  This can make your stomach fill up faster making you feel sick  Do not brush your teeth right after you eat  Products with real ginger are good for nausea, like ginger ale and ginger hard candy Make sure it says made with real ginger!  Sucking on sour candy like lemon heads is also good for nausea  If your prenatal vitamins make you nauseated, take them at night so you will sleep through the nausea  Sea Bands  If you feel like you need medicine for the nausea & vomiting please let us know  If you are unable to keep any fluids or food down please let us know   First Trimester of Pregnancy The first trimester of pregnancy is from week 1 until the end of week 12 (months 1 through 3). A week after a sperm fertilizes an egg, the egg will implant on the wall of the  uterus. This embryo will begin to develop into a baby. Genes from you and your partner are forming the baby. The female genes determine whether the baby is a boy or a girl. At 6-8 weeks, the eyes and face are formed, and the heartbeat can be seen on ultrasound. At the end of 12 weeks, all the baby's organs are formed.  Now that you are pregnant, you will want to do everything you can to have a healthy baby. Two of the most important things are to get good prenatal care and to follow your health care provider's instructions. Prenatal care is all the medical care you receive before the baby's birth. This care will help prevent, find, and treat any problems during the pregnancy and childbirth. BODY CHANGES Your body goes through many changes during pregnancy. The changes vary from woman to woman.  You may gain or lose a couple of pounds at first. You may feel sick to your stomach (nauseous) and throw up (vomit). If the vomiting is uncontrollable, call your health care provider. You may tire easily. You may develop headaches that can be relieved by medicines approved by your health care provider. You may urinate more often. Painful urination may mean you have a bladder infection. You may develop heartburn as a result of your pregnancy. You may develop constipation because certain   You may develop constipation because certain hormones are causing the muscles that push waste through your intestines to slow down.  You may develop hemorrhoids or swollen, bulging veins (varicose veins).  Your breasts may begin to grow larger and become tender. Your nipples may stick out more, and the tissue that surrounds them (areola) may become darker.  Your gums may bleed and may be sensitive to brushing and flossing.  Dark spots or blotches (chloasma, mask of pregnancy) may develop on your face. This will likely fade after the baby is born.  Your menstrual periods will stop.  You may have a loss of appetite.  You may develop cravings for certain  kinds of food.  You may have changes in your emotions from day to day, such as being excited to be pregnant or being concerned that something may go wrong with the pregnancy and baby.  You may have more vivid and strange dreams.  You may have changes in your hair. These can include thickening of your hair, rapid growth, and changes in texture. Some women also have hair loss during or after pregnancy, or hair that feels dry or thin. Your hair will most likely return to normal after your baby is born. WHAT TO EXPECT AT YOUR PRENATAL VISITS During a routine prenatal visit:  You will be weighed to make sure you and the baby are growing normally.  Your blood pressure will be taken.  Your abdomen will be measured to track your baby's growth.  The fetal heartbeat will be listened to starting around week 10 or 12 of your pregnancy.  Test results from any previous visits will be discussed. Your health care provider may ask you:  How you are feeling.  If you are feeling the baby move.  If you have had any abnormal symptoms, such as leaking fluid, bleeding, severe headaches, or abdominal cramping.  If you have any questions. Other tests that may be performed during your first trimester include:  Blood tests to find your blood type and to check for the presence of any previous infections. They will also be used to check for low iron levels (anemia) and Rh antibodies. Later in the pregnancy, blood tests for diabetes will be done along with other tests if problems develop.  Urine tests to check for infections, diabetes, or protein in the urine.  An ultrasound to confirm the proper growth and development of the baby.  An amniocentesis to check for possible genetic problems.  Fetal screens for spina bifida and Down syndrome.  You may need other tests to make sure you and the baby are doing well. HOME CARE INSTRUCTIONS  Medicines  Follow your health care provider's instructions regarding  medicine use. Specific medicines may be either safe or unsafe to take during pregnancy.  Take your prenatal vitamins as directed.  If you develop constipation, try taking a stool softener if your health care provider approves. Diet  Eat regular, well-balanced meals. Choose a variety of foods, such as meat or vegetable-based protein, fish, milk and low-fat dairy products, vegetables, fruits, and whole grain breads and cereals. Your health care provider will help you determine the amount of weight gain that is right for you.  Avoid raw meat and uncooked cheese. These carry germs that can cause birth defects in the baby.  Eating four or five small meals rather than three large meals a day may help relieve nausea and vomiting. If you start to feel nauseous, eating a few soda crackers can be   helpful. Drinking liquids between meals instead of during meals also seems to help nausea and vomiting.  If you develop constipation, eat more high-fiber foods, such as fresh vegetables or fruit and whole grains. Drink enough fluids to keep your urine clear or pale yellow. Activity and Exercise  Exercise only as directed by your health care provider. Exercising will help you:  Control your weight.  Stay in shape.  Be prepared for labor and delivery.  Experiencing pain or cramping in the lower abdomen or low back is a good sign that you should stop exercising. Check with your health care provider before continuing normal exercises.  Try to avoid standing for long periods of time. Move your legs often if you must stand in one place for a long time.  Avoid heavy lifting.  Wear low-heeled shoes, and practice good posture.  You may continue to have sex unless your health care provider directs you otherwise. Relief of Pain or Discomfort  Wear a good support bra for breast tenderness.   Take warm sitz baths to soothe any pain or discomfort caused by hemorrhoids. Use hemorrhoid cream if your health care  provider approves.   Rest with your legs elevated if you have leg cramps or low back pain.  If you develop varicose veins in your legs, wear support hose. Elevate your feet for 15 minutes, 3-4 times a day. Limit salt in your diet. Prenatal Care  Schedule your prenatal visits by the twelfth week of pregnancy. They are usually scheduled monthly at first, then more often in the last 2 months before delivery.  Write down your questions. Take them to your prenatal visits.  Keep all your prenatal visits as directed by your health care provider. Safety  Wear your seat belt at all times when driving.  Make a list of emergency phone numbers, including numbers for family, friends, the hospital, and police and fire departments. General Tips  Ask your health care provider for a referral to a local prenatal education class. Begin classes no later than at the beginning of month 6 of your pregnancy.  Ask for help if you have counseling or nutritional needs during pregnancy. Your health care provider can offer advice or refer you to specialists for help with various needs.  Do not use hot tubs, steam rooms, or saunas.  Do not douche or use tampons or scented sanitary pads.  Do not cross your legs for long periods of time.  Avoid cat litter boxes and soil used by cats. These carry germs that can cause birth defects in the baby and possibly loss of the fetus by miscarriage or stillbirth.  Avoid all smoking, herbs, alcohol, and medicines not prescribed by your health care provider. Chemicals in these affect the formation and growth of the baby.  Schedule a dentist appointment. At home, brush your teeth with a soft toothbrush and be gentle when you floss. SEEK MEDICAL CARE IF:   You have dizziness.  You have mild pelvic cramps, pelvic pressure, or nagging pain in the abdominal area.  You have persistent nausea, vomiting, or diarrhea.  You have a bad smelling vaginal discharge.  You have pain  with urination.  You notice increased swelling in your face, hands, legs, or ankles. SEEK IMMEDIATE MEDICAL CARE IF:   You have a fever.  You are leaking fluid from your vagina.  You have spotting or bleeding from your vagina.  You have severe abdominal cramping or pain.  You have rapid weight gain or loss.    replace advice given to you by your health care provider. Make sure you discuss any questions you have with your health care provider.  

## 2014-02-16 LAB — HEPATITIS B SURFACE ANTIGEN: HEP B S AG: NEGATIVE

## 2014-02-16 LAB — DRUG SCREEN, URINE, NO CONFIRMATION
Amphetamine Screen, Ur: NEGATIVE
BARBITURATE QUANT UR: NEGATIVE
Benzodiazepines.: NEGATIVE
Cocaine Metabolites: NEGATIVE
Creatinine,U: 124 mg/dL
Marijuana Metabolite: NEGATIVE
Methadone: NEGATIVE
Opiate Screen, Urine: NEGATIVE
PROPOXYPHENE: NEGATIVE
Phencyclidine (PCP): NEGATIVE

## 2014-02-16 LAB — URINE CULTURE: Colony Count: 40000

## 2014-02-16 LAB — URINALYSIS, MICROSCOPIC ONLY
CRYSTALS: NONE SEEN
Casts: NONE SEEN
Squamous Epithelial / LPF: NONE SEEN

## 2014-02-16 LAB — RPR

## 2014-02-16 LAB — GC/CHLAMYDIA PROBE AMP
CT Probe RNA: NEGATIVE
GC PROBE AMP APTIMA: NEGATIVE

## 2014-02-16 LAB — URINALYSIS, ROUTINE W REFLEX MICROSCOPIC
Bilirubin Urine: NEGATIVE
Glucose, UA: NEGATIVE mg/dL
HGB URINE DIPSTICK: NEGATIVE
KETONES UR: NEGATIVE mg/dL
NITRITE: NEGATIVE
PROTEIN: NEGATIVE mg/dL
Specific Gravity, Urine: 1.021 (ref 1.005–1.030)
Urobilinogen, UA: 0.2 mg/dL (ref 0.0–1.0)
pH: 7 (ref 5.0–8.0)

## 2014-02-16 LAB — ABO AND RH: Rh Type: POSITIVE

## 2014-02-16 LAB — OXYCODONE SCREEN, UA, RFLX CONFIRM: Oxycodone Screen, Ur: NEGATIVE ng/mL

## 2014-02-16 LAB — VARICELLA ZOSTER ANTIBODY, IGG: Varicella IgG: 2001 Index — ABNORMAL HIGH (ref <135.00)

## 2014-02-16 LAB — RUBELLA SCREEN: RUBELLA: 1.88 {index} — AB (ref <0.90)

## 2014-02-16 LAB — HIV ANTIBODY (ROUTINE TESTING W REFLEX): HIV 1&2 Ab, 4th Generation: NONREACTIVE

## 2014-02-16 LAB — ANTIBODY SCREEN: ANTIBODY SCREEN: NEGATIVE

## 2014-02-17 LAB — CYSTIC FIBROSIS DIAGNOSTIC STUDY: Result Summary:: DETECTED — AB

## 2014-02-22 ENCOUNTER — Telehealth: Payer: Self-pay | Admitting: *Deleted

## 2014-02-22 ENCOUNTER — Telehealth: Payer: Self-pay | Admitting: Women's Health

## 2014-02-22 DIAGNOSIS — Z3401 Encounter for supervision of normal first pregnancy, first trimester: Secondary | ICD-10-CM

## 2014-02-22 DIAGNOSIS — O09891 Supervision of other high risk pregnancies, first trimester: Secondary | ICD-10-CM

## 2014-02-22 DIAGNOSIS — Z141 Cystic fibrosis carrier: Secondary | ICD-10-CM | POA: Insufficient documentation

## 2014-02-22 NOTE — Telephone Encounter (Signed)
Attempted to call pt back to notify of +CF carrier results. FOB will need to be tested.  [name], CNM, [facility] 02/22/2014 2:39 PM

## 2014-02-22 NOTE — Telephone Encounter (Signed)
Attempted to contact pt, mailbox full- unable to leave message. Need to notify of +CF carrier.  [name], CNM, [facility] 02/22/2014 10:38 AM

## 2014-02-22 NOTE — Telephone Encounter (Signed)
Notified pt of +CF carrier status, can bring FOB to next appt for him to be tested.  [name], CNM, [facility] 02/22/2014 2:43 PM

## 2014-03-15 ENCOUNTER — Other Ambulatory Visit: Payer: BC Managed Care – PPO

## 2014-03-15 ENCOUNTER — Encounter: Payer: BC Managed Care – PPO | Admitting: Women's Health

## 2014-03-23 ENCOUNTER — Telehealth: Payer: Self-pay | Admitting: *Deleted

## 2014-03-23 NOTE — Telephone Encounter (Signed)
LMOM that it is strongly encouraged to hold off on any medications until after 14 wks (she will be 14 wks on Friday) but she can use cough drops.

## 2014-03-24 ENCOUNTER — Ambulatory Visit (INDEPENDENT_AMBULATORY_CARE_PROVIDER_SITE_OTHER): Payer: BC Managed Care – PPO | Admitting: Family Medicine

## 2014-03-24 ENCOUNTER — Telehealth: Payer: Self-pay | Admitting: Obstetrics & Gynecology

## 2014-03-24 VITALS — BP 102/62 | HR 60 | Temp 98.2°F | Resp 16 | Ht 63.5 in | Wt 146.6 lb

## 2014-03-24 DIAGNOSIS — J069 Acute upper respiratory infection, unspecified: Secondary | ICD-10-CM

## 2014-03-24 NOTE — Progress Notes (Signed)
[facility], [place] [address] [place] [address] [phone]  Date:  03/24/2014   Name:  Emily Chan   DOB:  [birth year]   MRN:  [id number]  PCP:  Alonza Bogus, MD    Chief Complaint: Cough and Nasal Congestion   History of Present Illness:  Emily Chan is a 33 y.o. very pleasant female patient who presents with the following:  Generally healthy lady who is here today with illness for 2-3 days.  She has noted a cough, nasal congestion, ST.  Cough is generally non- productive but she occasionally coughs up some mucus She has not noted a fever.  She is currently pregnant, just coming to the end of her first trimester. This is her first pregnancy- all is going well so far.  She is due 09/24/2014 No vaginal bleeding, loss of fluid, or abd pain.  She has not noted fetal movement as of yet.    She did take tylenol this am for her sx but she is not sure if she can take anything else.  She had called her OB for advice and they suggested that she be seen  Patient Active Problem List   Diagnosis Date Noted  . 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 Supervision of normal first pregnancy 02/15/2014  . Mild dysplasia of cervix 07/16/2013  . Yeast infection 12/25/2012  . Abnormal Pap smear of cervix 06/15/2012    Past Medical History  Diagnosis Date  . Abnormal Pap smear   . Hx: UTI (urinary tract infection)   . Contraception management 06/16/2012  . Vaginal irritation 12/25/2012  . Yeast infection 12/25/2012  . Vaginal Pap smear, abnormal   . Pregnant 01/28/2014    Past Surgical History  Procedure Laterality Date  . Appendectomy    . [place] tooth extraction      History  Substance Use Topics  . Smoking status: Never Smoker   . Smokeless tobacco: Never Used  . Alcohol Use: No     Comment: occ.    Family History  Problem Relation Age of Onset  . Cancer Paternal Aunt     melenoma  . Diabetes Maternal Grandmother   . Hypertension  Maternal Grandmother   . Cancer Maternal Grandfather     Brain and Lung  . Diabetes Maternal Grandfather   . Cancer Paternal Grandmother     breast    Allergies  Allergen Reactions  . Sulfa Antibiotics     Medication list has been reviewed and updated.  Current Outpatient Prescriptions on File Prior to Visit  Medication Sig Dispense Refill  . Docusate Calcium (STOOL SOFTENER PO) Take by mouth.    . prenatal vitamin w/FE, FA (PRENATAL 1 + 1) 27-1 MG TABS tablet Take 1 tablet by mouth daily at 12 noon. 30 each 11   No current facility-administered medications on file prior to visit.    Review of Systems:  As per HPI- otherwise negative.   Physical Examination: Filed Vitals:   03/24/14 0819  BP: 102/62  Pulse: 96  Temp: 98.2 F (36.8 C)  Resp: 16   Filed Vitals:   03/24/14 0819  Height: 5' 3.5" (1.613 m)  Weight: 146 lb 9.6 oz (66.497 kg)   Body mass index is 25.56 kg/(m^2). Ideal Body Weight: Weight in (lb) to have BMI = 25: 143.1  GEN: WDWN, NAD, Non-toxic, A & O x 3, looks well HEENT: Atraumatic, Normocephalic. Neck supple. No masses, No LAD.  Bilateral TM wnl,  oropharynx normal.  PEERL,EOMI.   Mucus in nose Ears and Nose: No external deformity. CV: RRR, No M/G/R. No JVD. No thrill. No extra heart sounds. PULM: CTA B, no wheezes, crackles, rhonchi. No retractions. No resp. distress. No accessory muscle use. EXTR: No c/c/e NEURO Normal gait.  PSYCH: Normally interactive. Conversant. Not depressed or anxious appearing.  Calm demeanor.    Assessment and Plan: Viral URI  reassurance that at this time she appears to have a viral infection.  Gave her a hand- out regarding safe OTC medications in pregnancy from a local OB provider (available online). Let her know that I defer to the advice of her OB practice regarding what is safe for her to take during pregnancy. She will carefully monitor her sx and seek care if not better soon  Signed Lamar Blinks,  MD

## 2014-03-24 NOTE — Telephone Encounter (Signed)
Pt states went to Urgent Care for cold, cough, runny nose. Pt states was told it was safe to take delsym. Informed pt to can gargle with warm salt water for sore or scratchy throat, cough drops. Can take OTC robitussin or delsym at 14 weeks. Pt verbalized understanding. Next appt is on 03/29/14 with Knute Neu, CNM.

## 2014-03-24 NOTE — Patient Instructions (Signed)
Rest and drink plenty of fluids. If you wish you can use the OTC medication list that I gave you.   If you are feeling worse, having a fever or if you are not improving in 4-5 days please be sure to give [place] a call or otherwise seek care.    Congratulations on your pregnancy and I hope all goes smoothly for you!

## 2014-03-26 NOTE — L&D Delivery Note (Cosign Needed)
Delivery Note At 9:23 AM a viable and healthy female was delivered via Vaginal, Spontaneous Delivery (Presentation: Right Occiput Anterior).  APGAR: 7, 9; weight pending.   Placenta status: Intact, Spontaneous.  Cord: 3 vessels with the following complications: None.  Cord pH: n/a  Anesthesia: Epidural  Episiotomy: None Lacerations: 2nd degree;Perineal; repaired by Dr. Tawni Levy Repair: 3.0 Monocryl Est. Blood Loss (mL):  350  Mom to postpartum.  Baby to Couplet care / Skin to Skin.  [name] N 03/24/2015, 9:54 AM

## 2014-03-29 ENCOUNTER — Other Ambulatory Visit: Payer: Self-pay | Admitting: Women's Health

## 2014-03-29 ENCOUNTER — Ambulatory Visit (INDEPENDENT_AMBULATORY_CARE_PROVIDER_SITE_OTHER): Payer: BLUE CROSS/BLUE SHIELD

## 2014-03-29 ENCOUNTER — Ambulatory Visit (INDEPENDENT_AMBULATORY_CARE_PROVIDER_SITE_OTHER): Payer: BLUE CROSS/BLUE SHIELD | Admitting: Women's Health

## 2014-03-29 ENCOUNTER — Encounter: Payer: Self-pay | Admitting: Women's Health

## 2014-03-29 VITALS — BP 124/84 | Wt 143.0 lb

## 2014-03-29 DIAGNOSIS — Z1389 Encounter for screening for other disorder: Secondary | ICD-10-CM

## 2014-03-29 DIAGNOSIS — O021 Missed abortion: Secondary | ICD-10-CM

## 2014-03-29 DIAGNOSIS — O039 Complete or unspecified spontaneous abortion without complication: Secondary | ICD-10-CM | POA: Insufficient documentation

## 2014-03-29 DIAGNOSIS — Z331 Pregnant state, incidental: Secondary | ICD-10-CM

## 2014-03-29 DIAGNOSIS — O4692 Antepartum hemorrhage, unspecified, second trimester: Secondary | ICD-10-CM

## 2014-03-29 LAB — POCT URINALYSIS DIPSTICK
GLUCOSE UA: NEGATIVE
Ketones, UA: NEGATIVE
Leukocytes, UA: NEGATIVE
Nitrite, UA: NEGATIVE
Protein, UA: NEGATIVE

## 2014-03-29 MED ORDER — OXYCODONE-ACETAMINOPHEN 5-325 MG PO TABS: 1.0000 | ORAL_TABLET | ORAL | Status: DC | PRN

## 2014-03-29 MED ORDER — MISOPROSTOL 200 MCG PO TABS: 800.0000 ug | ORAL_TABLET | Freq: Once | ORAL | Status: DC

## 2014-03-29 NOTE — Patient Instructions (Signed)
FACTS YOU SHOULD KNOW  About Early Pregnancy Loss  WHAT IS AN EARLY PREGNANCY LOSS? Once the egg is fertilized with the sperm and begins to develop, it attaches to the lining of the uterus. This early pregnancy tissue may not develop into an embryo (the beginning stage of a baby). Sometimes an embryo does develop but does not continue to grow. These problems can be seen on ultrasound.   MANAGEMNT OF EARLY PREGNANCY LOSS: About 4 out of 100 (0.25%) women will have a pregnancy loss in her lifetime.  One in five pregnancies is found to be an early pregnancy loss.  There are 3 ways to care for an early pregnancy loss:   (1) Surgery, (2) Medicine, (3) Waiting for you to pass the pregnancy on your own. The decision as to how to proceed after being diagnosed with and early pregnancy loss is an individual one.  The decision can be made only after appropriate counseling.  You need to weigh the pros and cons of the 3 choices. Then you can make the choice that works for you.  SURGERY (D&E) . Procedure over in 1 day . Requires being put to sleep . Bleeding may be light . Possible problems during surgery, including injury to womb(uterus) . Care provider has more control Medicine ([place]) . The complete procedure may take days to weeks . No Surgery . Bleeding may be heavy at times . There may be drug side effects . Patient has more control Waiting . You may choose to wait, in which case your own body may complete the passing of the abnormal early pregnancy on its own in about 2-4 weeks . Your bleeding may be heavy at times . There is a small possibility that you may need surgery if the bleeding is too much or not all of the pregnancy has passed.  CYTOTEC MANAGEMENT Prostaglandins (cytotec) are the most widely used drug for this purpose. They cause the uterus to cramp and contract. You will place the medicine yourself inside your vagina in the privacy of your home. Empting of the uterus should occur  within 3 days but the process may continue for several weeks. The bleeding may seem heavy at times.  INSTRUCTIONS: Take all 4 tablets of cytotec (88mg total) at one time. This will cause a lot of cramping, you may have bleeding, and pass tissue, then the cramping and bleeding should get better. If you do not pass the tissue, then you can take 4 more tablets of cytotec (8092m total) 48 hours after your first dose.  You will come back to have your blood drawn to make sure the pregnancy hormones are dropping in 1 week. Please call [place] you have any questions.   POSSIBLE SIDE EFFECTS FROM CYTOTEC . Nausea  Vomiting . Diarrhea Fever . Chills  Hot Flashes Side effects  from the process of the early pregnancy loss include: . Cramping  Bleeding . Headaches  Dizziness RISKS: This is a low risk procedure. Less than 1 in 100 women has a complication. An incomplete passage of the early pregnancy may occur. Also, hemorrhage (heavy bleeding) could happen.  Rarely the pregnancy will not be passed completely. Excessively heavy bleeding may occur.  Your doctor may need to perform surgery to empty the uterus (D&E). Afterwards: Everybody will feel differently after the early pregnancy loss completion. You may have soreness or cramps for a day or two. You may have soreness or cramps for day or two.  You may have light  bleeding for up to 2 weeks. You may be as active as you feel like being. If you have any of the following problems you may call [facility] at [phone] or [facility]missions [facility] at [phone] if it is after hours. . If you have pain that does not get better with pain medication . Bleeding that soaks through 2 thick full-sized sanitary pads in an hour . Cramps that last longer than 2 days . Foul smelling discharge . Fever above 100.4 degrees F Even if you do not have any of these symptoms, you should have a follow-up exam to make sure you are healing properly. Your next normal period will  usually start again in 4-6 week after the loss. You can get pregnant soon after the loss, so use birth control right away. Finally: Make sure all your questions are answered before during and after any procedure. Follow up with medical care and family planning methods.      

## 2014-03-29 NOTE — Progress Notes (Addendum)
Low-risk OB appointment G1P0 [redacted]w[redacted]d Estimated Date of Delivery: 09/24/14 BP 124/84 mmHg  Wt 143 lb (64.864 kg)  LMP 12/18/2013 (Exact Date)  BP, weight, and urine reviewed.  Unable to hear FHR w/ doppler.  No fm yet. Denies lof, uti s/s. Had to cancel last 2 appts, so didn't get to do NT/IT, no longer wants genetic screening.  Has had cramping and spotting that started Saturday. Called nurse line and they told her as long as wasn't filling up a pad, nothing to be checked out.  Spec exam: cx visually closed, lots of dark red/brown blood, some oozing from cx.  Concerned for SAB, will get in w/ tasha u/s to confirm.  U/S: CRL measures 8wks, no FCA, confirmed SAB. Rh+ Discussed w/ JVF, can still offer cytotec d/t CRL <9wks vs. D&C.  Pt prefers cytotec, if no success, then D&C Rx cytotec 860mcg x 1 po, may repeat in 48hrs if needed Rx percocet #15 prn pain Reviewed warning s/s, reasons to seek care. F/U on Thursday for f/u w/ JVF

## 2014-03-29 NOTE — Progress Notes (Signed)
U/S(14+wks)-single IUP NO FCA noted, CRL c/w 8+4wks Knute Neu in room during ultrasound, cx appears closed, bilateral adnexa appears WNL

## 2014-04-01 ENCOUNTER — Ambulatory Visit (INDEPENDENT_AMBULATORY_CARE_PROVIDER_SITE_OTHER): Payer: BLUE CROSS/BLUE SHIELD | Admitting: Obstetrics and Gynecology

## 2014-04-01 ENCOUNTER — Encounter: Payer: Self-pay | Admitting: Obstetrics and Gynecology

## 2014-04-01 ENCOUNTER — Other Ambulatory Visit: Payer: Self-pay | Admitting: Obstetrics and Gynecology

## 2014-04-01 ENCOUNTER — Ambulatory Visit (INDEPENDENT_AMBULATORY_CARE_PROVIDER_SITE_OTHER): Payer: BLUE CROSS/BLUE SHIELD

## 2014-04-01 VITALS — BP 126/80 | Ht 64.0 in | Wt 141.0 lb

## 2014-04-01 DIAGNOSIS — O039 Complete or unspecified spontaneous abortion without complication: Secondary | ICD-10-CM

## 2014-04-01 DIAGNOSIS — O0380 Unspecified complication following complete or unspecified spontaneous abortion: Secondary | ICD-10-CM

## 2014-04-01 NOTE — Progress Notes (Signed)
Patient ID: Emily Chan, female   DOB: 05-Jan-1981, 34 y.o.   MRN: [id number] Pt here today for follow up from misc. Pt states that she had heavy bleeding, cramping on Monday and Tuesday. Pt states that the bleeding is still there but lighter than before.    [facility] Visit  Patient name: Emily Chan MRN [id number]  Date of birth: [birth year]  CC & HPI:  Emily Chan is a 34 y.o. female presenting today for followup from Missed ab tx'd with cytotec U/s repeated: Emily Chan is a 34 y.o. G1P0010 for a pelvic sonogram for MAB follow up.  Uterus anteverted  Endometrium 16.7 mm, no GS noted within the caivty  Right ovary Rt adnexa appears WNL  Left ovary Appears WNL  No free fluid or adnexal masses noted within the pelvis  Technician Comments:  Anteverted uterus, Endom-16.60mm no GS noted within the cavity, bilateral adnexa appears WNL   Emily Chan 04/01/2014 12:03 PM   ROS:  Feels well  Pertinent History Reviewed:   Reviewed: Significant for  Medical         Past Medical History  Diagnosis Date  . Abnormal Pap smear   . Hx: UTI (urinary tract infection)   . Contraception management 06/16/2012  . Vaginal irritation 12/25/2012  . Yeast infection 12/25/2012  . Vaginal Pap smear, abnormal   . Pregnant 01/28/2014                              Surgical Hx:    Past Surgical History  Procedure Laterality Date  . Appendectomy    . Wisdom tooth extraction     Medications: Reviewed & Updated - see associated section                      Current outpatient prescriptions: acetaminophen (TYLENOL) 325 MG tablet, Take 650 mg by mouth every 6 (six) hours as needed., Disp: , Rfl: ;  Docusate Calcium (STOOL SOFTENER PO), Take by mouth., Disp: , Rfl: ;  misoprostol (CYTOTEC) 200 MCG tablet, Take 4 tablets (800 mcg total) by mouth once. (Patient not taking: Reported on 04/01/2014), Disp: 4 tablet, Rfl:  1 oxyCODONE-acetaminophen (PERCOCET/ROXICET) 5-325 MG per tablet, Take 1-2 tablets by mouth every 4 (four) hours as needed for severe pain. (Patient not taking: Reported on 04/01/2014), Disp: 15 tablet, Rfl: 0;  prenatal vitamin w/FE, FA (PRENATAL 1 + 1) 27-1 MG TABS tablet, Take 1 tablet by mouth daily at 12 noon. (Patient not taking: Reported on 04/01/2014), Disp: 30 each, Rfl: 11   Social History: Reviewed -  reports that she has never smoked. She has never used smokeless tobacco.  Objective Findings:  Vitals: Blood pressure 126/80, height 5\' 4"  (1.626 m), weight 141 lb (63.957 kg), last menstrual period 12/18/2013, not currently breastfeeding.  Physical Examination: General appearance - alert, well appearing, and in no distress and oriented to person, place, and time Mental status - alert, oriented to person, place, and time, normal mood, behavior, speech, dress, motor activity, and thought processes Abdomen - soft, nontender, nondistended, no masses or organomegaly   Assessment & Plan:   A:  1. Spontaneous Ab, completed. 2. Blood type Rh positive       P:  1. Return prn contraception decision 2. Given info sheet on options

## 2014-06-03 ENCOUNTER — Ambulatory Visit (INDEPENDENT_AMBULATORY_CARE_PROVIDER_SITE_OTHER): Payer: PRIVATE HEALTH INSURANCE | Admitting: Adult Health

## 2014-06-03 ENCOUNTER — Encounter: Payer: Self-pay | Admitting: Adult Health

## 2014-06-03 VITALS — BP 110/80 | HR 60 | Ht 64.0 in | Wt 146.0 lb

## 2014-06-03 DIAGNOSIS — R102 Pelvic and perineal pain: Secondary | ICD-10-CM

## 2014-06-03 LAB — POCT URINALYSIS DIPSTICK
Blood, UA: NEGATIVE
GLUCOSE UA: NEGATIVE
Leukocytes, UA: NEGATIVE
Nitrite, UA: NEGATIVE

## 2014-06-03 NOTE — Progress Notes (Signed)
Subjective:     Patient ID: Emily Chan, female   DOB: [birth year], 34 y.o.   MRN: [id number]  HPI Emily Chan is a 34 year old white female in complaining of pelvic pain, it was about 2/19 and was sharp and lasted 2-3 hours and and then just achy pressure.She had a miscarriage in January and has had 2 periods 2/2 and 3/2.Periods have been heavier and more cramping since miscarriage.  Review of Systems  Patient denies any headaches, hearing loss, fatigue, blurred vision, shortness of breath, chest pain,problems with bowel movements, urination, or intercourse. No joint pain or mood swings.+ pelvic pain. Reviewed past medical,surgical, social and family history. Reviewed medications and allergies.     Objective:   Physical Exam BP 110/80 mmHg  Pulse 60  Ht 5\' 4"  (1.626 m)  Wt 146 lb (66.225 kg)  BMI 25.05 kg/m2  LMP 03/02/2016Urine trace protein, Skin warm and dry.Pelvic: external genitalia is normal in appearance no lesions, vagina: tan discharge without odor,urethra has no lesions or masses noted, cervix:everted and bulbous,negative CMT uterus: normal size, shape and contour, non tender, no masses felt, adnexa: no masses or tenderness noted. Bladder is non tender and no masses felt. Discussed this could be ovulation by the way it sounds.    Assessment:     Pelvic pain, probably ovulation    Plan:     Keep event calendar Take prenatal vitamins Return in 4 weeks for pap and physical

## 2014-06-03 NOTE — Patient Instructions (Addendum)
Pap and physical in 4 weeks Keep calendar of event

## 2014-07-05 ENCOUNTER — Encounter: Payer: Self-pay | Admitting: Adult Health

## 2014-07-05 ENCOUNTER — Ambulatory Visit (INDEPENDENT_AMBULATORY_CARE_PROVIDER_SITE_OTHER): Payer: PRIVATE HEALTH INSURANCE | Admitting: Adult Health

## 2014-07-05 ENCOUNTER — Other Ambulatory Visit (HOSPITAL_COMMUNITY)
Admission: RE | Admit: 2014-07-05 | Discharge: 2014-07-05 | Disposition: A | Discharge status: Home / Self Care | Payer: PRIVATE HEALTH INSURANCE | Source: Ambulatory Visit | Attending: Adult Health | Admitting: Adult Health

## 2014-07-05 VITALS — BP 108/68 | HR 72 | Ht 63.0 in | Wt 147.5 lb

## 2014-07-05 DIAGNOSIS — Z1151 Encounter for screening for human papillomavirus (HPV): Secondary | ICD-10-CM | POA: Insufficient documentation

## 2014-07-05 DIAGNOSIS — Z01419 Encounter for gynecological examination (general) (routine) without abnormal findings: Secondary | ICD-10-CM | POA: Diagnosis not present

## 2014-07-05 DIAGNOSIS — Z01411 Encounter for gynecological examination (general) (routine) with abnormal findings: Secondary | ICD-10-CM | POA: Diagnosis not present

## 2014-07-05 DIAGNOSIS — R87810 Cervical high risk human papillomavirus (HPV) DNA test positive: Secondary | ICD-10-CM | POA: Insufficient documentation

## 2014-07-05 DIAGNOSIS — Z8742 Personal history of other diseases of the female genital tract: Secondary | ICD-10-CM

## 2014-07-05 NOTE — Patient Instructions (Signed)
Physical in 1 year 

## 2014-07-05 NOTE — Progress Notes (Signed)
Patient ID: Emily Chan, female   DOB: [birth year], 34 y.o.   MRN: [id number] History of Present Illness: Briauna is a 34 year old white female in for well woman gyn exam and pap.She had a LGSIL pap in 06/2013, and colpo showed low grade dysplasia.   Current Medications, Allergies, Past Medical History, Past Surgical History, Family History and Social History were reviewed in [employer] record.     Review of Systems: Patient denies any headaches, hearing loss, fatigue, blurred vision, shortness of breath, chest pain, abdominal pain, problems with bowel movements, urination, or intercourse. No joint pain or mood swings.She is using condoms after having miscarriage, has bought a house and will try to get pregnant again ,is also taking prenatals.    Physical Exam:BP 108/68 mmHg  Pulse 72  Ht 5\' 3"  (1.6 m)  Wt 147 lb 8 oz (66.906 kg)  BMI 26.14 kg/m2  LMP 06/22/2014 General:  Well developed, well nourished, no acute distress Skin:  Warm and dry Neck:  Midline trachea, normal thyroid, good ROM, no lymphadenopathy Lungs; Clear to auscultation bilaterally Breast:  No dominant palpable mass, retraction, or nipple discharge Cardiovascular: Regular rate and rhythm Abdomen:  Soft, non tender, no hepatosplenomegaly Pelvic:  External genitalia is normal in appearance, no lesions.  The vagina is normal in appearance. Urethra has no lesions or masses. The cervix is bulbous and everted at os, pap with HPV performed.  Uterus is felt to be normal size, shape, and contour.  No adnexal masses or tenderness noted.Bladder is non tender, no masses felt. Extremities/musculoskeletal:  No swelling or varicosities noted, no clubbing or cyanosis Psych:  No mood changes, alert and cooperative,seems happy   Impression: Well woman gyn exam with pap History of abnormal pap    Plan: Physical in 1 year or before if gets pregnant

## 2014-07-07 LAB — CYTOLOGY - PAP

## 2014-07-13 ENCOUNTER — Telehealth: Payer: Self-pay | Admitting: Adult Health

## 2014-07-13 NOTE — Telephone Encounter (Signed)
Left message to call.

## 2014-07-14 ENCOUNTER — Telehealth: Payer: Self-pay | Admitting: Adult Health

## 2014-07-14 NOTE — Telephone Encounter (Signed)
Pt aware of abnormal pap and need for colpo, will make appt

## 2014-07-14 NOTE — Telephone Encounter (Signed)
Not available

## 2014-07-22 ENCOUNTER — Encounter: Payer: PRIVATE HEALTH INSURANCE | Admitting: Obstetrics and Gynecology

## 2014-07-26 ENCOUNTER — Ambulatory Visit (INDEPENDENT_AMBULATORY_CARE_PROVIDER_SITE_OTHER): Payer: PRIVATE HEALTH INSURANCE | Admitting: Obstetrics and Gynecology

## 2014-07-26 ENCOUNTER — Other Ambulatory Visit: Payer: Self-pay | Admitting: Obstetrics and Gynecology

## 2014-07-26 ENCOUNTER — Encounter: Payer: Self-pay | Admitting: Obstetrics and Gynecology

## 2014-07-26 VITALS — BP 112/60 | Ht 64.0 in | Wt 145.0 lb

## 2014-07-26 DIAGNOSIS — R87612 Low grade squamous intraepithelial lesion on cytologic smear of cervix (LGSIL): Secondary | ICD-10-CM

## 2014-07-26 DIAGNOSIS — Z3200 Encounter for pregnancy test, result unknown: Secondary | ICD-10-CM

## 2014-07-26 DIAGNOSIS — Z3202 Encounter for pregnancy test, result negative: Secondary | ICD-10-CM

## 2014-07-26 LAB — POCT URINE PREGNANCY: Preg Test, Ur: NEGATIVE

## 2014-07-26 NOTE — Progress Notes (Signed)
Patient ID: Emily Chan, female   DOB: December 06, 1980, 35 y.o.   MRN: [id number] Pt here today for colposcopy. Pt states that she had a colposcopy before. Consent signed and UPT today is negative today.

## 2014-07-26 NOTE — Progress Notes (Signed)
Patient ID: Emily Chan, female   DOB: May 21, 1980, 34 y.o.   MRN: [id number]   Emily Chan 34 y.o. G1P0010 here for colposcopy for low-grade squamous intraepithelial neoplasia (LGSIL - encompassing HPV,mild dysplasia,CIN I) pap smear on 4/11. Pt reports abnormal pap smears annually for the last 4 years. Discussed role for HPV in cervical dysplasia, need for surveillance. Pt is in a monogamous relationship starting 2 years ago. She is hoping to become pregnant in the future.  Patient given informed consent, signed copy in the chart, time out was performed.  Placed in lithotomy position. Cervix viewed with speculum and colposcope after application of acetic acid.   Colposcopy adequate? Yes  no visible lesions and no punctation; biopsies not obtained.   ECC specimen obtained. All specimens were labelled and sent to pathology.  Colposcopy IMPRESSION: NO VISIBLE dysplasia  Patient was given post procedure instructions. Will follow up pathology and manage accordingly.  Routine preventative health maintenance measures emphasized.   This chart was scribed for Jonnie Kind, MD by Tula Nakayama, ED Scribe. This patient was seen in room 2 and the patient's care was started at 12:09 PM.   I personally performed the services described in this documentation, which was SCRIBED in my presence. The recorded information has been reviewed and considered accurate. It has been edited as necessary during review. Jonnie Kind, MD

## 2014-08-24 ENCOUNTER — Encounter: Payer: Self-pay | Admitting: Adult Health

## 2014-08-24 ENCOUNTER — Ambulatory Visit (INDEPENDENT_AMBULATORY_CARE_PROVIDER_SITE_OTHER): Payer: PRIVATE HEALTH INSURANCE | Admitting: Adult Health

## 2014-08-24 VITALS — BP 122/80 | HR 80 | Ht 64.0 in | Wt 148.5 lb

## 2014-08-24 DIAGNOSIS — O3680X0 Pregnancy with inconclusive fetal viability, not applicable or unspecified: Secondary | ICD-10-CM

## 2014-08-24 DIAGNOSIS — R109 Unspecified abdominal pain: Secondary | ICD-10-CM

## 2014-08-24 DIAGNOSIS — Z3490 Encounter for supervision of normal pregnancy, unspecified, unspecified trimester: Secondary | ICD-10-CM

## 2014-08-24 DIAGNOSIS — O26899 Other specified pregnancy related conditions, unspecified trimester: Secondary | ICD-10-CM | POA: Insufficient documentation

## 2014-08-24 DIAGNOSIS — Z3201 Encounter for pregnancy test, result positive: Secondary | ICD-10-CM

## 2014-08-24 HISTORY — DX: Other specified pregnancy related conditions, unspecified trimester: O26.899

## 2014-08-24 LAB — POCT URINE PREGNANCY: PREG TEST UR: POSITIVE

## 2014-08-24 NOTE — Patient Instructions (Signed)
First Trimester of Pregnancy The first trimester of pregnancy is from week 1 until the end of week 12 (months 1 through 3). A week after a sperm fertilizes an egg, the egg will implant on the wall of the uterus. This embryo will begin to develop into a baby. Genes from you and your partner are forming the baby. The female genes determine whether the baby is a boy or a girl. At 6-8 weeks, the eyes and face are formed, and the heartbeat can be seen on ultrasound. At the end of 12 weeks, all the baby's organs are formed.  Now that you are pregnant, you will want to do everything you can to have a healthy baby. Two of the most important things are to get good prenatal care and to follow your health care provider's instructions. Prenatal care is all the medical care you receive before the baby's birth. This care will help prevent, find, and treat any problems during the pregnancy and childbirth. BODY CHANGES Your body goes through many changes during pregnancy. The changes vary from woman to woman.   You may gain or lose a couple of pounds at first.  You may feel sick to your stomach (nauseous) and throw up (vomit). If the vomiting is uncontrollable, call your health care provider.  You may tire easily.  You may develop headaches that can be relieved by medicines approved by your health care provider.  You may urinate more often. Painful urination may mean you have a bladder infection.  You may develop heartburn as a result of your pregnancy.  You may develop constipation because certain hormones are causing the muscles that push waste through your intestines to slow down.  You may develop hemorrhoids or swollen, bulging veins (varicose veins).  Your breasts may begin to grow larger and become tender. Your nipples may stick out more, and the tissue that surrounds them (areola) may become darker.  Your gums may bleed and may be sensitive to brushing and flossing.  Dark spots or blotches (chloasma,  mask of pregnancy) may develop on your face. This will likely fade after the baby is born.  Your menstrual periods will stop.  You may have a loss of appetite.  You may develop cravings for certain kinds of food.  You may have changes in your emotions from day to day, such as being excited to be pregnant or being concerned that something may go wrong with the pregnancy and baby.  You may have more vivid and strange dreams.  You may have changes in your hair. These can include thickening of your hair, rapid growth, and changes in texture. Some women also have hair loss during or after pregnancy, or hair that feels dry or thin. Your hair will most likely return to normal after your baby is born. WHAT TO EXPECT AT YOUR PRENATAL VISITS During a routine prenatal visit:  You will be weighed to make sure you and the baby are growing normally.  Your blood pressure will be taken.  Your abdomen will be measured to track your baby's growth.  The fetal heartbeat will be listened to starting around week 10 or 12 of your pregnancy.  Test results from any previous visits will be discussed. Your health care provider may ask you:  How you are feeling.  If you are feeling the baby move.  If you have had any abnormal symptoms, such as leaking fluid, bleeding, severe headaches, or abdominal cramping.  If you have any questions. Other tests   that may be performed during your first trimester include:  Blood tests to find your blood type and to check for the presence of any previous infections. They will also be used to check for low iron levels (anemia) and Rh antibodies. Later in the pregnancy, blood tests for diabetes will be done along with other tests if problems develop.  Urine tests to check for infections, diabetes, or protein in the urine.  An ultrasound to confirm the proper growth and development of the baby.  An amniocentesis to check for possible genetic problems.  Fetal screens for  spina bifida and Down syndrome.  You may need other tests to make sure you and the baby are doing well. HOME CARE INSTRUCTIONS  Medicines  Follow your health care provider's instructions regarding medicine use. Specific medicines may be either safe or unsafe to take during pregnancy.  Take your prenatal vitamins as directed.  If you develop constipation, try taking a stool softener if your health care provider approves. Diet  Eat regular, well-balanced meals. Choose a variety of foods, such as meat or vegetable-based protein, fish, milk and low-fat dairy products, vegetables, fruits, and whole grain breads and cereals. Your health care provider will help you determine the amount of weight gain that is right for you.  Avoid raw meat and uncooked cheese. These carry germs that can cause birth defects in the baby.  Eating four or five small meals rather than three large meals a day may help relieve nausea and vomiting. If you start to feel nauseous, eating a few soda crackers can be helpful. Drinking liquids between meals instead of during meals also seems to help nausea and vomiting.  If you develop constipation, eat more high-fiber foods, such as fresh vegetables or fruit and whole grains. Drink enough fluids to keep your urine clear or pale yellow. Activity and Exercise  Exercise only as directed by your health care provider. Exercising will help you:  Control your weight.  Stay in shape.  Be prepared for labor and delivery.  Experiencing pain or cramping in the lower abdomen or low back is a good sign that you should stop exercising. Check with your health care provider before continuing normal exercises.  Try to avoid standing for long periods of time. Move your legs often if you must stand in one place for a long time.  Avoid heavy lifting.  Wear low-heeled shoes, and practice good posture.  You may continue to have sex unless your health care provider directs you  otherwise. Relief of Pain or Discomfort  Wear a good support bra for breast tenderness.   Take warm sitz baths to soothe any pain or discomfort caused by hemorrhoids. Use hemorrhoid cream if your health care provider approves.   Rest with your legs elevated if you have leg cramps or low back pain.  If you develop varicose veins in your legs, wear support hose. Elevate your feet for 15 minutes, 3-4 times a day. Limit salt in your diet. Prenatal Care  Schedule your prenatal visits by the twelfth week of pregnancy. They are usually scheduled monthly at first, then more often in the last 2 months before delivery.  Write down your questions. Take them to your prenatal visits.  Keep all your prenatal visits as directed by your health care provider. Safety  Wear your seat belt at all times when driving.  Make a list of emergency phone numbers, including numbers for family, friends, the hospital, and police and fire departments. General Tips    Ask your health care provider for a referral to a local prenatal education class. Begin classes no later than at the beginning of month 6 of your pregnancy.  Ask for help if you have counseling or nutritional needs during pregnancy. Your health care provider can offer advice or refer you to specialists for help with various needs.  Do not use hot tubs, steam rooms, or saunas.  Do not douche or use tampons or scented sanitary pads.  Do not cross your legs for long periods of time.  Avoid cat litter boxes and soil used by cats. These carry germs that can cause birth defects in the baby and possibly loss of the fetus by miscarriage or stillbirth.  Avoid all smoking, herbs, alcohol, and medicines not prescribed by your health care provider. Chemicals in these affect the formation and growth of the baby.  Schedule a dentist appointment. At home, brush your teeth with a soft toothbrush and be gentle when you floss. SEEK MEDICAL CARE IF:   You have  dizziness.  You have mild pelvic cramps, pelvic pressure, or nagging pain in the abdominal area.  You have persistent nausea, vomiting, or diarrhea.  You have a bad smelling vaginal discharge.  You have pain with urination.  You notice increased swelling in your face, hands, legs, or ankles. SEEK IMMEDIATE MEDICAL CARE IF:   You have a fever.  You are leaking fluid from your vagina.  You have spotting or bleeding from your vagina.  You have severe abdominal cramping or pain.  You have rapid weight gain or loss.  You vomit blood or material that looks like coffee grounds.  You are exposed to [place] measles and have never had them.  You are exposed to fifth disease or chickenpox.  You develop a severe headache.  You have shortness of breath.  You have any kind of trauma, such as from a fall or a car accident. Document Released: 03/06/2001 Document Revised: 07/27/2013 Document Reviewed: 01/20/2013 [facility] Patient Information 2015 [place], [place]. This information is not intended to replace advice given to you by your health care provider. Make sure you discuss any questions you have with your health care provider. Return in 2 weeks for [place] No sex while cramping Increase water

## 2014-08-24 NOTE — Progress Notes (Signed)
Subjective:     Patient ID: Emily Chan, female   DOB: December 15, 1980, 34 y.o.   MRN: [id number]  HPI Work in appt. Emily Chan is a 34 year old white female in complaining of cramping, no bleeding, has +HPT Thursday and is concerned, she has not been that long since had miscarriage, her blood type is O+.  Review of Systems +missed period  +cramping Patient denies any headaches, hearing loss, fatigue, blurred vision, shortness of breath, chest pain, problems with bowel movements, urination, or intercourse. No joint pain or mood swings.No bleeding.  Reviewed past medical,surgical, social and family history. Reviewed medications and allergies.     Objective:   Physical Exam BP 122/80 mmHg  Pulse 80  Ht 5\' 4"  (1.626 m)  Wt 148 lb 8 oz (67.359 kg)  BMI 25.48 kg/m2  LMP 07/17/2014 UPT +, Skin warm and dry. Lungs: clear to ausculation bilaterally. Cardiovascular: regular rate and rhythm,abdomen soft non tender    Assessment:     Pregnant Cramping     Plan:     No sex while cramping Increase water Return in 2 weeks for dating [place] Check QHCG and progesterone Review handout on first trimester

## 2014-08-25 ENCOUNTER — Telehealth: Payer: Self-pay | Admitting: Adult Health

## 2014-08-25 LAB — BETA HCG QUANT (REF LAB): hCG Quant: 4288 m[IU]/mL

## 2014-08-25 LAB — PROGESTERONE: PROGESTERONE: 32.6 ng/mL

## 2014-08-25 NOTE — Telephone Encounter (Signed)
Left message that progesterone level is good and QHCG is showing about 5-6 weeks,if any questions call me

## 2014-09-01 ENCOUNTER — Other Ambulatory Visit: Payer: Self-pay | Admitting: Adult Health

## 2014-09-01 ENCOUNTER — Telehealth: Payer: Self-pay | Admitting: *Deleted

## 2014-09-01 DIAGNOSIS — O209 Hemorrhage in early pregnancy, unspecified: Secondary | ICD-10-CM

## 2014-09-01 NOTE — Telephone Encounter (Signed)
Spoke with pt. Pt started with heavy spotting and bleeding yesterday and this am. Little bit of cramping, but that's not a new symptom. She will be 7 weeks tomorrow. I spoke with JAG and she advised to work pt in for [place]. Pt can't come in today, but can come in tomorrow. Advised to push fluids, don't have sex, and don't lift anything heavy. Pt voiced understanding.  Pt to be worked in for [place] and talk to [employer] afterwards. [place]

## 2014-09-01 NOTE — Telephone Encounter (Signed)
Left message x 1. JSY 

## 2014-09-02 ENCOUNTER — Encounter: Payer: Self-pay | Admitting: Adult Health

## 2014-09-02 ENCOUNTER — Ambulatory Visit (INDEPENDENT_AMBULATORY_CARE_PROVIDER_SITE_OTHER): Payer: PRIVATE HEALTH INSURANCE

## 2014-09-02 ENCOUNTER — Ambulatory Visit (INDEPENDENT_AMBULATORY_CARE_PROVIDER_SITE_OTHER): Payer: PRIVATE HEALTH INSURANCE | Admitting: Adult Health

## 2014-09-02 VITALS — BP 132/80 | HR 64 | Wt 146.5 lb

## 2014-09-02 DIAGNOSIS — O209 Hemorrhage in early pregnancy, unspecified: Secondary | ICD-10-CM

## 2014-09-02 DIAGNOSIS — O4691 Antepartum hemorrhage, unspecified, first trimester: Secondary | ICD-10-CM | POA: Diagnosis not present

## 2014-09-02 DIAGNOSIS — Z3A01 Less than 8 weeks gestation of pregnancy: Secondary | ICD-10-CM | POA: Diagnosis not present

## 2014-09-02 HISTORY — DX: Hemorrhage in early pregnancy, unspecified: O20.9

## 2014-09-02 NOTE — Patient Instructions (Signed)
NO sex, no straining F/U in 1 week Increase fluids

## 2014-09-02 NOTE — Progress Notes (Signed)
Subjective:     Patient ID: Emily Chan, female   DOB: 05-30-1980, 34 y.o.   MRN: [id number]  HPI work in [place] is a 34 year old white female, called yesterday complaining of vaginal bleeding and some cramps, she recently had miscarriage and is worried.Had not had sex or hard BM.  Review of Systems Patient denies any headaches, hearing loss, fatigue, blurred vision, shortness of breath, chest pain, abdominal pain, problems with bowel movements, urination, or intercourse. No joint pain or mood swings.+vaginal bleeding  Reviewed past medical,surgical, social and family history. Reviewed medications and allergies.     Objective:   Physical Exam BP 132/80 mmHg  Pulse 64  Wt 146 lb 8 oz (66.452 kg)  LMP 07/17/2014 Skin warm and dry.Pelvic: external genitalia is normal in appearance no lesions, vagina: tan discharge without odor,urethra has no lesions or masses noted, cervix:everted at os,No CMT uterus: normal size, shape and contour, non tender, no masses felt, adnexa: no masses or tenderness noted. Bladder is non tender and no masses felt.  US performed and shows IUP about 6+5 weeks with FHR of 110,cervix closed and CL on left, no Wanship seen, EDD 04/23/15.Marland KitchenDiscussed that progesterone was good and in early pregnancy can have some spotting, no sex or heavy lifting.    Assessment:     Bleeding in early pregnancy    Plan:     Pelvic rest No sex Return in 1 week for F/U US, call before if any concerns, she is aware of after hours call

## 2014-09-02 NOTE — Progress Notes (Signed)
[place] 6+5WKS single iup pos fht 110bpm,normal rt ov,complex lt ov cyst 2.7 x 1.9 x 1.9cm,crl 5.48mm,edd 04/23/2015

## 2014-09-09 ENCOUNTER — Other Ambulatory Visit: Payer: PRIVATE HEALTH INSURANCE

## 2014-09-09 ENCOUNTER — Ambulatory Visit (INDEPENDENT_AMBULATORY_CARE_PROVIDER_SITE_OTHER): Payer: PRIVATE HEALTH INSURANCE

## 2014-09-09 DIAGNOSIS — O3680X0 Pregnancy with inconclusive fetal viability, not applicable or unspecified: Secondary | ICD-10-CM | POA: Diagnosis not present

## 2014-09-09 NOTE — Progress Notes (Signed)
[place] 7+5WKS single iup pos fht 144bpm,normal rt ov,complex cl cyst lt ov 2.5 x 2.1 x 1.7cm,crl 1.06cm

## 2014-09-13 ENCOUNTER — Telehealth: Payer: Self-pay | Admitting: Women's Health

## 2014-09-13 NOTE — Telephone Encounter (Signed)
Pt states that she had a little spotting on Saturday and really bad cramping since Saturday. Pt states that she has had no bleeding since the episode Saturday. Pt states that she is still having cramping but it's not as bad. Pt states that when she woke up Saturday, she had 4-5 drops of bright red blood and then no more bleeding after that. Pt states that she had a little brownish discharge Saturday night. Pt states that she the bleeding was not after a BM or sex. Pt states that the cramping on Sunday was a stabbing pain but more of a nagging pain today. Pt has an appointment on Thursday but didn't know if she should be seen before or not. I advised the pt that I would send this message to [facility] and see what she wants her to do. Pt verbalized understanding.

## 2014-09-13 NOTE — Telephone Encounter (Signed)
Not bleeding now, keep appt Thursday, will get [place] before that appt, no sex or lifting

## 2014-09-16 ENCOUNTER — Ambulatory Visit (HOSPITAL_COMMUNITY)
Admission: RE | Admit: 2014-09-16 | Discharge: 2014-09-16 | Disposition: A | Discharge status: Home / Self Care | Payer: PRIVATE HEALTH INSURANCE | Source: Ambulatory Visit | Attending: Adult Health | Admitting: Adult Health

## 2014-09-16 ENCOUNTER — Other Ambulatory Visit: Payer: Self-pay | Admitting: Adult Health

## 2014-09-16 ENCOUNTER — Encounter: Payer: PRIVATE HEALTH INSURANCE | Admitting: Advanced Practice Midwife

## 2014-09-16 ENCOUNTER — Other Ambulatory Visit: Payer: PRIVATE HEALTH INSURANCE

## 2014-09-16 DIAGNOSIS — O3680X0 Pregnancy with inconclusive fetal viability, not applicable or unspecified: Secondary | ICD-10-CM | POA: Insufficient documentation

## 2014-09-20 ENCOUNTER — Encounter: Payer: Self-pay | Admitting: Women's Health

## 2014-09-20 ENCOUNTER — Ambulatory Visit (INDEPENDENT_AMBULATORY_CARE_PROVIDER_SITE_OTHER): Payer: PRIVATE HEALTH INSURANCE | Admitting: Women's Health

## 2014-09-20 VITALS — BP 104/60 | HR 76 | Wt 146.0 lb

## 2014-09-20 DIAGNOSIS — Z3490 Encounter for supervision of normal pregnancy, unspecified, unspecified trimester: Secondary | ICD-10-CM | POA: Insufficient documentation

## 2014-09-20 DIAGNOSIS — Z331 Pregnant state, incidental: Secondary | ICD-10-CM

## 2014-09-20 DIAGNOSIS — Z3491 Encounter for supervision of normal pregnancy, unspecified, first trimester: Secondary | ICD-10-CM

## 2014-09-20 DIAGNOSIS — Z369 Encounter for antenatal screening, unspecified: Secondary | ICD-10-CM

## 2014-09-20 DIAGNOSIS — R87612 Low grade squamous intraepithelial lesion on cytologic smear of cervix (LGSIL): Secondary | ICD-10-CM

## 2014-09-20 DIAGNOSIS — Z0283 Encounter for blood-alcohol and blood-drug test: Secondary | ICD-10-CM

## 2014-09-20 DIAGNOSIS — Z1389 Encounter for screening for other disorder: Secondary | ICD-10-CM

## 2014-09-20 DIAGNOSIS — Z141 Cystic fibrosis carrier: Secondary | ICD-10-CM

## 2014-09-20 DIAGNOSIS — O09891 Supervision of other high risk pregnancies, first trimester: Secondary | ICD-10-CM

## 2014-09-20 LAB — POCT URINALYSIS DIPSTICK
Glucose, UA: NEGATIVE
Ketones, UA: NEGATIVE
Leukocytes, UA: NEGATIVE
NITRITE UA: NEGATIVE
Protein, UA: NEGATIVE
RBC UA: NEGATIVE

## 2014-09-20 NOTE — Progress Notes (Signed)
  Subjective:  Emily Chan is a 34 y.o. G91P0010 Caucasian female at [redacted]w[redacted]d by 8wk u/s, being seen today for her first obstetrical visit.  Her obstetrical history is significant for 8wk missed Ab by CRL at 14.3wks last pregnancy.   Pregnancy history fully reviewed.  Patient reports had some cramping/spotting earlier- both have resolved now. Some nausea, no vomiting- declines meds. Some RLP. Headaches, cold packs help. Denies vb, cramping, uti s/s, abnormal/malodorous vag d/c, or vulvovaginal itching/irritation.  BP 104/60 mmHg  Pulse 76  Wt 146 lb (66.225 kg)  LMP 07/17/2014  HISTORY: OB History  Gravida Para Term Preterm AB SAB TAB Ectopic Multiple Living  2    1         # Outcome Date GA Lbr Len/2nd Weight Sex Delivery Anes PTL Lv  2 Current           1 AB 03/29/14 [redacted]w[redacted]d           Obstetric Comments  14.3wks missed ab, CRL measures 8wks   Past Medical History  Diagnosis Date  . Abnormal Pap smear   . Hx: UTI (urinary tract infection)   . Contraception management 06/16/2012  . Vaginal irritation 12/25/2012  . Yeast infection 12/25/2012  . Vaginal Pap smear, abnormal   . Pregnant 01/28/2014  . Miscarriage   . Abdominal cramping affecting pregnancy 08/24/2014  . Bleeding in early pregnancy 09/02/2014    Pelvic rest   Past Surgical History  Procedure Laterality Date  . Appendectomy    . Wisdom tooth extraction     Family History  Problem Relation Age of Onset  . Cancer Paternal Aunt     melenoma  . Diabetes Maternal Grandmother   . Hypertension Maternal Grandmother   . Cancer Maternal Grandfather     Brain and Lung  . Diabetes Maternal Grandfather   . Cancer Paternal Grandmother     breast    Exam   System:     General: Well developed & nourished, no acute distress   Skin: Warm & dry, normal coloration and turgor, no rashes   Neurologic: Alert & oriented, normal mood   Cardiovascular: Regular rate & rhythm   Respiratory: Effort & rate normal, LCTAB, acyanotic   Abdomen: Soft, non tender   Extremities: normal strength, tone  Thin prep pap smear LSIL +HRHPV 07/05/14 w/ colpo 07/26/14 w/ benign ECC  FHR: 172 via doppler   Assessment:   Pregnancy: G2P0010 Patient Active Problem List   Diagnosis Date Noted  . Supervision of normal pregnancy 09/20/2014    Priority: High  . 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 Bleeding in early pregnancy 09/02/2014  . Abdominal cramping affecting pregnancy 08/24/2014  . Mild dysplasia of cervix 07/16/2013  . Yeast infection 12/25/2012  . Abnormal Pap smear of cervix 06/15/2012    [redacted]w[redacted]d G2P0010 New OB visit Mild nausea RLP +CF carrier Headaches   Plan:  Initial labs drawn Continue prenatal vitamins Problem list reviewed and updated Reviewed n/v relief measures and warning s/s to report Reviewed recommended weight gain based on pre-gravid BMI Encouraged well-balanced diet Gave printed headache info Genetic Screening discussed Integrated Screen: declined Cystic fibrosis screening discussed Pos carrier last pregnancy, FOB does not want to be tested Ultrasound discussed; fetal survey: requested Follow up in 4 weeks for visit [place] completed  Tawnya Crook CNM, [facility] 09/20/2014 9:13 AM

## 2014-09-20 NOTE — Patient Instructions (Signed)
For Headaches:   Stay well hydrated, drink enough water so that your urine is clear, sometimes if you are dehydrated you can get headaches  Eat small frequent meals and snacks, sometimes if you are hungry you can get headaches  Sometimes you get headaches during pregnancy from the pregnancy hormones  You can try tylenol (1-2 regular strength 325mg  or 1-2 extra strength 500mg ) as directed on the box. The least amount of medication that works is best.   Cool compresses (cool wet washcloth or ice pack) to area of head that is hurting  You can also try drinking a caffeinated drink to see if this will help  Call us if these things aren't helping your headaches   Nausea & Vomiting  Have saltine crackers or pretzels by your bed and eat a few bites before you raise your head out of bed in the morning  Eat small frequent meals throughout the day instead of large meals  Drink plenty of fluids throughout the day to stay hydrated, just don't drink a lot of fluids with your meals.  This can make your stomach fill up faster making you feel sick  Do not brush your teeth right after you eat  Products with real ginger are good for nausea, like ginger ale and ginger hard candy Make sure it says made with real ginger!  Sucking on sour candy like lemon heads is also good for nausea  If your prenatal vitamins make you nauseated, take them at night so you will sleep through the nausea  Sea Bands  If you feel like you need medicine for the nausea & vomiting please let us know  If you are unable to keep any fluids or food down please let us know   Constipation  Drink plenty of fluid, preferably water, throughout the day  Eat foods high in fiber such as fruits, vegetables, and grains  Exercise, such as walking, is a good way to keep your bowels regular  Drink warm fluids, especially warm prune juice, or decaf coffee  Eat a 1/2 cup of real oatmeal (not instant), 1/2 cup applesauce, and 1/2-1  cup warm prune juice every day  If needed, you may take Colace (docusate sodium) stool softener once or twice a day to help keep the stool soft. If you are pregnant, wait until you are out of your first trimester (12-14 weeks of pregnancy)  If you still are having problems with constipation, you may take Miralax once daily as needed to help keep your bowels regular.  If you are pregnant, wait until you are out of your first trimester (12-14 weeks of pregnancy)   First Trimester of Pregnancy The first trimester of pregnancy is from week 1 until the end of week 12 (months 1 through 3). A week after a sperm fertilizes an egg, the egg will implant on the wall of the uterus. This embryo will begin to develop into a baby. Genes from you and your partner are forming the baby. The female genes determine whether the baby is a boy or a girl. At 6-8 weeks, the eyes and face are formed, and the heartbeat can be seen on ultrasound. At the end of 12 weeks, all the baby's organs are formed.  Now that you are pregnant, you will want to do everything you can to have a healthy baby. Two of the most important things are to get good prenatal care and to follow your health care provider's instructions. Prenatal care is all the  medical care you receive before the baby's birth. This care will help prevent, find, and treat any problems during the pregnancy and childbirth. BODY CHANGES Your body goes through many changes during pregnancy. The changes vary from woman to woman.   You may gain or lose a couple of pounds at first.  You may feel sick to your stomach (nauseous) and throw up (vomit). If the vomiting is uncontrollable, call your health care provider.  You may tire easily.  You may develop headaches that can be relieved by medicines approved by your health care provider.  You may urinate more often. Painful urination may mean you have a bladder infection.  You may develop heartburn as a result of your  pregnancy.  You may develop constipation because certain hormones are causing the muscles that push waste through your intestines to slow down.  You may develop hemorrhoids or swollen, bulging veins (varicose veins).  Your breasts may begin to grow larger and become tender. Your nipples may stick out more, and the tissue that surrounds them (areola) may become darker.  Your gums may bleed and may be sensitive to brushing and flossing.  Dark spots or blotches (chloasma, mask of pregnancy) may develop on your face. This will likely fade after the baby is born.  Your menstrual periods will stop.  You may have a loss of appetite.  You may develop cravings for certain kinds of food.  You may have changes in your emotions from day to day, such as being excited to be pregnant or being concerned that something may go wrong with the pregnancy and baby.  You may have more vivid and strange dreams.  You may have changes in your hair. These can include thickening of your hair, rapid growth, and changes in texture. Some women also have hair loss during or after pregnancy, or hair that feels dry or thin. Your hair will most likely return to normal after your baby is born. WHAT TO EXPECT AT YOUR PRENATAL VISITS During a routine prenatal visit: 8. You will be weighed to make sure you and the baby are growing normally. 9. Your blood pressure will be taken. 10. Your abdomen will be measured to track your baby's growth. 11. The fetal heartbeat will be listened to starting around week 10 or 12 of your pregnancy. 12. Test results from any previous visits will be discussed. Your health care provider may ask you:  How you are feeling.  If you are feeling the baby move.  If you have had any abnormal symptoms, such as leaking fluid, bleeding, severe headaches, or abdominal cramping.  If you have any questions. Other tests that may be performed during your first trimester include:  Blood tests to find  your blood type and to check for the presence of any previous infections. They will also be used to check for low iron levels (anemia) and Rh antibodies. Later in the pregnancy, blood tests for diabetes will be done along with other tests if problems develop.  Urine tests to check for infections, diabetes, or protein in the urine.  An ultrasound to confirm the proper growth and development of the baby.  An amniocentesis to check for possible genetic problems.  Fetal screens for spina bifida and Down syndrome.  You may need other tests to make sure you and the baby are doing well. HOME CARE INSTRUCTIONS  Medicines  Follow your health care provider's instructions regarding medicine use. Specific medicines may be either safe or unsafe to take  during pregnancy.  Take your prenatal vitamins as directed.  If you develop constipation, try taking a stool softener if your health care provider approves. Diet  Eat regular, well-balanced meals. Choose a variety of foods, such as meat or vegetable-based protein, fish, milk and low-fat dairy products, vegetables, fruits, and whole grain breads and cereals. Your health care provider will help you determine the amount of weight gain that is right for you.  Avoid raw meat and uncooked cheese. These carry germs that can cause birth defects in the baby.  Eating four or five small meals rather than three large meals a day may help relieve nausea and vomiting. If you start to feel nauseous, eating a few soda crackers can be helpful. Drinking liquids between meals instead of during meals also seems to help nausea and vomiting.  If you develop constipation, eat more high-fiber foods, such as fresh vegetables or fruit and whole grains. Drink enough fluids to keep your urine clear or pale yellow. Activity and Exercise  Exercise only as directed by your health care provider. Exercising will help you:  Control your weight.  Stay in shape.  Be prepared for  labor and delivery.  Experiencing pain or cramping in the lower abdomen or low back is a good sign that you should stop exercising. Check with your health care provider before continuing normal exercises.  Try to avoid standing for long periods of time. Move your legs often if you must stand in one place for a long time.  Avoid heavy lifting.  Wear low-heeled shoes, and practice good posture.  You may continue to have sex unless your health care provider directs you otherwise. Relief of Pain or Discomfort  Wear a good support bra for breast tenderness.   Take warm sitz baths to soothe any pain or discomfort caused by hemorrhoids. Use hemorrhoid cream if your health care provider approves.   Rest with your legs elevated if you have leg cramps or low back pain.  If you develop varicose veins in your legs, wear support hose. Elevate your feet for 15 minutes, 3-4 times a day. Limit salt in your diet. Prenatal Care  Schedule your prenatal visits by the twelfth week of pregnancy. They are usually scheduled monthly at first, then more often in the last 2 months before delivery.  Write down your questions. Take them to your prenatal visits.  Keep all your prenatal visits as directed by your health care provider. Safety  Wear your seat belt at all times when driving.  Make a list of emergency phone numbers, including numbers for family, friends, the hospital, and police and fire departments. General Tips  Ask your health care provider for a referral to a local prenatal education class. Begin classes no later than at the beginning of month 6 of your pregnancy.  Ask for help if you have counseling or nutritional needs during pregnancy. Your health care provider can offer advice or refer you to specialists for help with various needs.  Do not use hot tubs, steam rooms, or saunas.  Do not douche or use tampons or scented sanitary pads.  Do not cross your legs for long periods of  time.  Avoid cat litter boxes and soil used by cats. These carry germs that can cause birth defects in the baby and possibly loss of the fetus by miscarriage or stillbirth.  Avoid all smoking, herbs, alcohol, and medicines not prescribed by your health care provider. Chemicals in these affect the formation and growth  of the baby.  Schedule a dentist appointment. At home, brush your teeth with a soft toothbrush and be gentle when you floss. SEEK MEDICAL CARE IF:   You have dizziness.  You have mild pelvic cramps, pelvic pressure, or nagging pain in the abdominal area.  You have persistent nausea, vomiting, or diarrhea.  You have a bad smelling vaginal discharge.  You have pain with urination.  You notice increased swelling in your face, hands, legs, or ankles. SEEK IMMEDIATE MEDICAL CARE IF:   You have a fever.  You are leaking fluid from your vagina.  You have spotting or bleeding from your vagina.  You have severe abdominal cramping or pain.  You have rapid weight gain or loss.  You vomit blood or material that looks like coffee grounds.  You are exposed to [place] measles and have never had them.  You are exposed to fifth disease or chickenpox.  You develop a severe headache.  You have shortness of breath.  You have any kind of trauma, such as from a fall or a car accident. Document Released: 03/06/2001 Document Revised: 07/27/2013 Document Reviewed: 01/20/2013 [facility] Patient Information 2015 [place], [place]. This information is not intended to replace advice given to you by your health care provider. Make sure you discuss any questions you have with your health care provider.

## 2014-09-21 LAB — CBC
Hematocrit: 41 % (ref 34.0–46.6)
Hemoglobin: 14.4 g/dL (ref 11.1–15.9)
MCH: 29.8 pg (ref 26.6–33.0)
MCHC: 35.1 g/dL (ref 31.5–35.7)
MCV: 85 fL (ref 79–97)
PLATELETS: 173 10*3/uL (ref 150–379)
RBC: 4.84 x10E6/uL (ref 3.77–5.28)
RDW: 14.4 % (ref 12.3–15.4)
WBC: 8.4 10*3/uL (ref 3.4–10.8)

## 2014-09-21 LAB — URINALYSIS, ROUTINE W REFLEX MICROSCOPIC
Bilirubin, UA: NEGATIVE
Glucose, UA: NEGATIVE
Ketones, UA: NEGATIVE
Leukocytes, UA: NEGATIVE
Nitrite, UA: NEGATIVE
Protein, UA: NEGATIVE
RBC, UA: NEGATIVE
Specific Gravity, UA: 1.011 (ref 1.005–1.030)
Urobilinogen, Ur: 0.2 mg/dL (ref 0.2–1.0)
pH, UA: 8 — ABNORMAL HIGH (ref 5.0–7.5)

## 2014-09-21 LAB — ABO/RH: Rh Factor: POSITIVE

## 2014-09-21 LAB — PMP SCREEN PROFILE (10S), URINE
Amphetamine Screen, Ur: NEGATIVE ng/mL
Barbiturate Screen, Ur: NEGATIVE ng/mL
Benzodiazepine Screen, Urine: NEGATIVE ng/mL
Cannabinoids Ur Ql Scn: NEGATIVE ng/mL
Cocaine(Metab.)Screen, Urine: NEGATIVE ng/mL
Creatinine(Crt), U: 39.2 mg/dL (ref 20.0–300.0)
Methadone Scn, Ur: NEGATIVE ng/mL
Opiate Scrn, Ur: NEGATIVE ng/mL
Oxycodone+Oxymorphone Ur Ql Scn: NEGATIVE ng/mL
PCP Scrn, Ur: NEGATIVE ng/mL
Ph of Urine: 7.8 (ref 4.5–8.9)
Propoxyphene, Screen: NEGATIVE ng/mL

## 2014-09-21 LAB — VARICELLA ZOSTER ANTIBODY, IGG: Varicella zoster IgG: 2116 index (ref >165)

## 2014-09-21 LAB — HIV ANTIBODY (ROUTINE TESTING W REFLEX): HIV Screen 4th Generation wRfx: NONREACTIVE

## 2014-09-21 LAB — RPR: RPR Ser Ql: NONREACTIVE

## 2014-09-21 LAB — ANTIBODY SCREEN: Antibody Screen: NEGATIVE

## 2014-09-21 LAB — URINE CULTURE

## 2014-09-21 LAB — RUBELLA SCREEN: RUBELLA: 1.92 {index} (ref >0.99)

## 2014-09-21 LAB — HEPATITIS B SURFACE ANTIGEN: Hepatitis B Surface Ag: NEGATIVE

## 2014-09-22 LAB — GC/CHLAMYDIA PROBE AMP
Chlamydia trachomatis, NAA: NEGATIVE
NEISSERIA GONORRHOEAE BY PCR: NEGATIVE

## 2014-10-14 ENCOUNTER — Ambulatory Visit (INDEPENDENT_AMBULATORY_CARE_PROVIDER_SITE_OTHER): Payer: PRIVATE HEALTH INSURANCE | Admitting: Advanced Practice Midwife

## 2014-10-14 VITALS — BP 124/76 | HR 89 | Wt 146.0 lb

## 2014-10-14 DIAGNOSIS — Z331 Pregnant state, incidental: Secondary | ICD-10-CM

## 2014-10-14 DIAGNOSIS — Z3491 Encounter for supervision of normal pregnancy, unspecified, first trimester: Secondary | ICD-10-CM

## 2014-10-14 DIAGNOSIS — Z1389 Encounter for screening for other disorder: Secondary | ICD-10-CM

## 2014-10-14 LAB — POCT URINALYSIS DIPSTICK
Blood, UA: NEGATIVE
Glucose, UA: NEGATIVE
Ketones, UA: NEGATIVE
Leukocytes, UA: NEGATIVE
NITRITE UA: NEGATIVE
PROTEIN UA: NEGATIVE

## 2014-10-14 NOTE — Progress Notes (Signed)
G2P0010 [redacted]w[redacted]d Estimated Date of Delivery: 04/24/15  Blood pressure 124/76, pulse 89, weight 146 lb (66.225 kg), last menstrual period 07/17/2014.   BP weight and urine results all reviewed and noted.  Please refer to the obstetrical flow sheet for the fundal height and fetal heart rate documentation:  Patient denies any further bleeding and no cramping Patient is without complaints. All questions were answered.  Plan:  Continued routine obstetrical care,   Follow up in 4 weeks for OB appointment,

## 2014-10-18 ENCOUNTER — Encounter: Payer: PRIVATE HEALTH INSURANCE | Admitting: Women's Health

## 2014-11-11 ENCOUNTER — Ambulatory Visit (INDEPENDENT_AMBULATORY_CARE_PROVIDER_SITE_OTHER): Payer: PRIVATE HEALTH INSURANCE | Admitting: Advanced Practice Midwife

## 2014-11-11 ENCOUNTER — Encounter: Payer: Self-pay | Admitting: Advanced Practice Midwife

## 2014-11-11 VITALS — BP 122/60 | HR 72 | Wt 149.5 lb

## 2014-11-11 DIAGNOSIS — Z331 Pregnant state, incidental: Secondary | ICD-10-CM

## 2014-11-11 DIAGNOSIS — Z363 Encounter for antenatal screening for malformations: Secondary | ICD-10-CM

## 2014-11-11 DIAGNOSIS — Z1389 Encounter for screening for other disorder: Secondary | ICD-10-CM

## 2014-11-11 DIAGNOSIS — Z3492 Encounter for supervision of normal pregnancy, unspecified, second trimester: Secondary | ICD-10-CM

## 2014-11-11 LAB — POCT URINALYSIS DIPSTICK
Glucose, UA: NEGATIVE
KETONES UA: NEGATIVE
Nitrite, UA: NEGATIVE
Protein, UA: NEGATIVE
RBC UA: NEGATIVE

## 2014-11-11 NOTE — Progress Notes (Signed)
G2P0010 [redacted]w[redacted]d Estimated Date of Delivery: 04/24/15  Blood pressure 122/60, pulse 72, weight 149 lb 8 oz (67.813 kg), last menstrual period 07/17/2014.   BP weight and urine results all reviewed and noted.  Please refer to the obstetrical flow sheet for the fundal height and fetal heart rate documentation:  Patient  denies any bleeding and no rupture of membranes symptoms or regular contractions. Patient is without complaints. All questions were answered.  Plan:  Continued routine obstetrical care,   Follow up in 3 weeks for OB appointment, anatomy scan

## 2014-12-02 ENCOUNTER — Ambulatory Visit (INDEPENDENT_AMBULATORY_CARE_PROVIDER_SITE_OTHER): Payer: PRIVATE HEALTH INSURANCE

## 2014-12-02 ENCOUNTER — Encounter: Payer: Self-pay | Admitting: Advanced Practice Midwife

## 2014-12-02 ENCOUNTER — Ambulatory Visit (INDEPENDENT_AMBULATORY_CARE_PROVIDER_SITE_OTHER): Payer: PRIVATE HEALTH INSURANCE | Admitting: Advanced Practice Midwife

## 2014-12-02 VITALS — BP 120/72 | HR 72 | Wt 157.0 lb

## 2014-12-02 DIAGNOSIS — Z363 Encounter for antenatal screening for malformations: Secondary | ICD-10-CM

## 2014-12-02 DIAGNOSIS — Z36 Encounter for antenatal screening of mother: Secondary | ICD-10-CM

## 2014-12-02 DIAGNOSIS — Z3492 Encounter for supervision of normal pregnancy, unspecified, second trimester: Secondary | ICD-10-CM

## 2014-12-02 NOTE — Progress Notes (Signed)
Pt states that she has a bad varicose vein on her left leg behind her knee. Pt denies any pain but is concerned.

## 2014-12-02 NOTE — Progress Notes (Signed)
[place] 19+4 wks,measurements c/w dates,normal ov's bilat,post pl gr 0, cephalic,svp of fluid 9.7XY,IAX 142 bpm,cx 4cm,1.7 x 1 x 1.7cm ant fibroid,efw 306g,anatomy complete no obvious abn seen.

## 2014-12-02 NOTE — Progress Notes (Signed)
G2P0010 [redacted]w[redacted]d Estimated Date of Delivery: 04/24/15  Last menstrual period 07/17/2014.   BP weight and urine results all reviewed and noted.  Please refer to the obstetrical flow sheet for the fundal height and fetal heart rate documentation: anatomy scan today:  [place] 19+4 wks,measurements c/w dates,normal ov's bilat,post pl gr 0, cephalic,svp of fluid 9.4BS,JGG 142 bpm,cx 4cm,1.7 x 1 x 1.7cm ant fibroid,efw 306g,anatomy complete no obvious abn seen.  Patient reports good fetal movement, denies any bleeding and no rupture of membranes symptoms or regular contractions. Patient is without complaints. All questions were answered.  No orders of the defined types were placed in this encounter.    Plan:  Continued routine obstetrical care,   Return in about 4 weeks (around 12/30/2014) for [place].

## 2014-12-27 ENCOUNTER — Telehealth: Payer: Self-pay | Admitting: Women's Health

## 2014-12-27 NOTE — Telephone Encounter (Signed)
Spoke with pt. Pt had a thick, peach color discharge this am. Just one time. She had a BM and urinated and does not see discharge now. No cramping. + baby movement. I advised everything sounds ok at this time. Advised to increase fluids and let us know if the discharge comes back and stays around, or if she starts cramping or decreased baby movement. Pt voiced understanding. Pt has a scheduled appt on Oct 10. Advised to keep that appt unless something changed. [place]

## 2014-12-30 ENCOUNTER — Encounter: Payer: PRIVATE HEALTH INSURANCE | Admitting: Advanced Practice Midwife

## 2015-01-03 ENCOUNTER — Ambulatory Visit (INDEPENDENT_AMBULATORY_CARE_PROVIDER_SITE_OTHER): Payer: PRIVATE HEALTH INSURANCE | Admitting: Women's Health

## 2015-01-03 ENCOUNTER — Encounter: Payer: PRIVATE HEALTH INSURANCE | Admitting: Women's Health

## 2015-01-03 VITALS — BP 106/60 | HR 90 | Wt 161.0 lb

## 2015-01-03 DIAGNOSIS — Z1389 Encounter for screening for other disorder: Secondary | ICD-10-CM

## 2015-01-03 DIAGNOSIS — Z331 Pregnant state, incidental: Secondary | ICD-10-CM

## 2015-01-03 DIAGNOSIS — Z23 Encounter for immunization: Secondary | ICD-10-CM | POA: Diagnosis not present

## 2015-01-03 DIAGNOSIS — Z3492 Encounter for supervision of normal pregnancy, unspecified, second trimester: Secondary | ICD-10-CM

## 2015-01-03 LAB — POCT URINALYSIS DIPSTICK
Blood, UA: NEGATIVE
GLUCOSE UA: NEGATIVE
Ketones, UA: NEGATIVE
LEUKOCYTES UA: NEGATIVE
NITRITE UA: NEGATIVE
Protein, UA: NEGATIVE

## 2015-01-03 NOTE — Patient Instructions (Addendum)
You will have your sugar test next visit.  Please do not eat or drink anything after midnight the night before you come, not even water.  You will be here for at least two hours.     Call the office [phone]) or go to [facility] if:  You begin to have strong, frequent contractions  Your water breaks.  Sometimes it is a big gush of fluid, sometimes it is just a trickle that keeps getting your panties wet or running down your legs  You have vaginal bleeding.  It is normal to have a small amount of spotting if your cervix was checked.   You don't feel your baby moving like normal.  If you don't, get you something to eat and drink and lay down and focus on feeling your baby move.   If your baby is still not moving like normal, you should call the office or go to [place] to Help Leg Cramps  Increase dietary sources of calcium (milk, yogurt, cheese, leafy greens, seafood, legumes, and fruit) and magnesium (dark leafy greens, nuts, seeds, fish, beans, whole grains, avocados, yogurt, bananas, dried fruit, dark chocolate)  Spoonful of regular yellow mustard every night  Pickle juice  Magnesium supplement: 1mmol in the morning, 2mmol at night (can find in the vitamin aisle)  Dorsiflexion of foot: pointing your toes back towards your knee during the cramp      Second Trimester of Pregnancy The second trimester is from week 13 through week 28, months 4 through 6. The second trimester is often a time when you feel your best. Your body has also adjusted to being pregnant, and you begin to feel better physically. Usually, morning sickness has lessened or quit completely, you may have more energy, and you may have an increase in appetite. The second trimester is also a time when the fetus is growing rapidly. At the end of the sixth month, the fetus is about 9 inches long and weighs about 1 pounds. You will likely begin to feel the baby move (quickening) between 18 and 20 weeks of  the pregnancy. BODY CHANGES Your body goes through many changes during pregnancy. The changes vary from woman to woman.  6. Your weight will continue to increase. You will notice your lower abdomen bulging out. 7. You may begin to get stretch marks on your hips, abdomen, and breasts. 8. You may develop headaches that can be relieved by medicines approved by your health care provider. 9. You may urinate more often because the fetus is pressing on your bladder. 10. You may develop or continue to have heartburn as a result of your pregnancy. 11. You may develop constipation because certain hormones are causing the muscles that push waste through your intestines to slow down. 12. You may develop hemorrhoids or swollen, bulging veins (varicose veins). 13. You may have back pain because of the weight gain and pregnancy hormones relaxing your joints between the bones in your pelvis and as a result of a shift in weight and the muscles that support your balance. 14. Your breasts will continue to grow and be tender. 15. Your gums may bleed and may be sensitive to brushing and flossing. 16. Dark spots or blotches (chloasma, mask of pregnancy) may develop on your face. This will likely fade after the baby is born. 17. A dark line from your belly button to the pubic area (linea nigra) may appear. This will likely fade after the baby is born. 18.  You may have changes in your hair. These can include thickening of your hair, rapid growth, and changes in texture. Some women also have hair loss during or after pregnancy, or hair that feels dry or thin. Your hair will most likely return to normal after your baby is born. WHAT TO EXPECT AT YOUR PRENATAL VISITS During a routine prenatal visit:  You will be weighed to make sure you and the fetus are growing normally.  Your blood pressure will be taken.  Your abdomen will be measured to track your baby's growth.  The fetal heartbeat will be listened to.  Any  test results from the previous visit will be discussed. Your health care provider may ask you: 5. How you are feeling. 6. If you are feeling the baby move. 7. If you have had any abnormal symptoms, such as leaking fluid, bleeding, severe headaches, or abdominal cramping. 8. If you have any questions. Other tests that may be performed during your second trimester include:  Blood tests that check for:  Low iron levels (anemia).  Gestational diabetes (between 24 and 28 weeks).  Rh antibodies.  Urine tests to check for infections, diabetes, or protein in the urine.  An ultrasound to confirm the proper growth and development of the baby.  An amniocentesis to check for possible genetic problems.  Fetal screens for spina bifida and Down syndrome. HOME CARE INSTRUCTIONS   Avoid all smoking, herbs, alcohol, and unprescribed drugs. These chemicals affect the formation and growth of the baby.  Follow your health care provider's instructions regarding medicine use. There are medicines that are either safe or unsafe to take during pregnancy.  Exercise only as directed by your health care provider. Experiencing uterine cramps is a good sign to stop exercising.  Continue to eat regular, healthy meals.  Wear a good support bra for breast tenderness.  Do not use hot tubs, steam rooms, or saunas.  Wear your seat belt at all times when driving.  Avoid raw meat, uncooked cheese, cat litter boxes, and soil used by cats. These carry germs that can cause birth defects in the baby.  Take your prenatal vitamins.  Try taking a stool softener (if your health care provider approves) if you develop constipation. Eat more high-fiber foods, such as fresh vegetables or fruit and whole grains. Drink plenty of fluids to keep your urine clear or pale yellow.  Take warm sitz baths to soothe any pain or discomfort caused by hemorrhoids. Use hemorrhoid cream if your health care provider approves.  If you  develop varicose veins, wear support hose. Elevate your feet for 15 minutes, 3-4 times a day. Limit salt in your diet.  Avoid heavy lifting, wear low heel shoes, and practice good posture.  Rest with your legs elevated if you have leg cramps or low back pain.  Visit your dentist if you have not gone yet during your pregnancy. Use a soft toothbrush to brush your teeth and be gentle when you floss.  A sexual relationship may be continued unless your health care provider directs you otherwise.  Continue to go to all your prenatal visits as directed by your health care provider. SEEK MEDICAL CARE IF:   You have dizziness.  You have mild pelvic cramps, pelvic pressure, or nagging pain in the abdominal area.  You have persistent nausea, vomiting, or diarrhea.  You have a bad smelling vaginal discharge.  You have pain with urination. SEEK IMMEDIATE MEDICAL CARE IF:   You have a fever.  You are leaking fluid from your vagina.  You have spotting or bleeding from your vagina.  You have severe abdominal cramping or pain.  You have rapid weight gain or loss.  You have shortness of breath with chest pain.  You notice sudden or extreme swelling of your face, hands, ankles, feet, or legs.  You have not felt your baby move in over an hour.  You have severe headaches that do not go away with medicine.  You have vision changes. Document Released: 03/06/2001 Document Revised: 03/17/2013 Document Reviewed: 05/13/2012 [facility] Patient Information 2015 [place], [place]. This information is not intended to replace advice given to you by your health care provider. Make sure you discuss any questions you have with your health care provider.

## 2015-01-03 NOTE — Progress Notes (Signed)
Low-risk OB appointment G2P0010 [redacted]w[redacted]d Estimated Date of Delivery: 04/24/15 BP 106/60 mmHg  Pulse 90  Wt 161 lb (73.029 kg)  LMP 07/17/2014  BP, weight, and urine reviewed.  Refer to obstetrical flow sheet for FH & FHR.  Reports good fm.  Denies regular uc's, lof, vb, or uti s/s. No complaints. Reviewed ptl s/s, fm. Plan:  Continue routine obstetrical care  F/U in 4wks for OB appointment and pn2 Flu shot today

## 2015-01-07 ENCOUNTER — Telehealth: Payer: Self-pay | Admitting: Obstetrics & Gynecology

## 2015-01-07 NOTE — Telephone Encounter (Signed)
Pt c/o sore throat x 2 days, HA, productive cough white mucus yesterday,some yellow this am, no fever, no chills. Pt states does feel better today after taking tylenol yesterday. Pt advised to take OTC mucinex , Sudafed or Robitussin, if no improvement call our office back. Pt verbalized understanding.

## 2015-02-01 ENCOUNTER — Telehealth: Payer: Self-pay | Admitting: Obstetrics & Gynecology

## 2015-02-01 NOTE — Telephone Encounter (Signed)
Pt c/o pinkish/light brownish discharge this am only and cramping. Pt informed to continue to monitor discharge if bright red blood, severe cramping to call our office back. Pt also encouraged to push fluids and can take tylenol for cramping. Pt verbalized understanding.

## 2015-02-02 ENCOUNTER — Ambulatory Visit (INDEPENDENT_AMBULATORY_CARE_PROVIDER_SITE_OTHER): Payer: PRIVATE HEALTH INSURANCE | Admitting: Advanced Practice Midwife

## 2015-02-02 ENCOUNTER — Encounter: Payer: Self-pay | Admitting: Advanced Practice Midwife

## 2015-02-02 VITALS — BP 100/60 | HR 92 | Wt 168.0 lb

## 2015-02-02 DIAGNOSIS — Z331 Pregnant state, incidental: Secondary | ICD-10-CM

## 2015-02-02 DIAGNOSIS — Z1389 Encounter for screening for other disorder: Secondary | ICD-10-CM

## 2015-02-02 DIAGNOSIS — O4690 Antepartum hemorrhage, unspecified, unspecified trimester: Secondary | ICD-10-CM

## 2015-02-02 DIAGNOSIS — Z3493 Encounter for supervision of normal pregnancy, unspecified, third trimester: Secondary | ICD-10-CM

## 2015-02-02 LAB — POCT URINALYSIS DIPSTICK
Blood, UA: 1
GLUCOSE UA: NEGATIVE
KETONES UA: NEGATIVE
LEUKOCYTES UA: NEGATIVE
NITRITE UA: NEGATIVE
PROTEIN UA: NEGATIVE

## 2015-02-02 MED ORDER — PRENATAL VITAMINS 0.8 MG PO TABS
1.0000 | ORAL_TABLET | Freq: Every day | ORAL | Status: DC
Start: 2015-02-02 — End: 2015-02-08

## 2015-02-02 MED ORDER — METRONIDAZOLE 500 MG PO TABS: 500.0000 mg | ORAL_TABLET | Freq: Two times a day (BID) | ORAL | Status: DC

## 2015-02-02 NOTE — Progress Notes (Signed)
G2P0010 [redacted]w[redacted]d Estimated Date of Delivery: 04/24/15  Blood pressure 100/60, pulse 92, weight 168 lb (76.204 kg), last menstrual period 07/17/2014.   BP weight and urine results all reviewed and noted.  Please refer to the obstetrical flow sheet for the fundal height and fetal heart rate documentation:  Patient reports good fetal movement, denies any bleeding and no rupture of membranes symptoms or regular contractions. Patient c/o spotting pink/brown yesterday and today with some pressure/pain bilateally lower pelvis.  States baby is moving "less than usual."  Has felt 3 movements while in exam room.  SSE:  Thin white dc, sl amine odor. no bleeding. FFn collected. Wet prep few clue, otherwise neg. Cx outer os FT, inner os closed.  Thick, no presenting part felt.  All questions were answered.  Orders Placed This Encounter  Procedures  . Fetal fibronectin  . POCT urinalysis dipstick    Plan:  Continued routine obstetrical care, Rx flagyl 500mg  BID.  Has appt tomorrow for PN2.  May cancel LROB appt tomorrow if not having any problems and reschedule for 3 weeks out.  No Follow-up on file.

## 2015-02-03 ENCOUNTER — Encounter: Payer: PRIVATE HEALTH INSURANCE | Admitting: Advanced Practice Midwife

## 2015-02-03 ENCOUNTER — Encounter: Payer: Self-pay | Admitting: Advanced Practice Midwife

## 2015-02-03 ENCOUNTER — Other Ambulatory Visit: Payer: PRIVATE HEALTH INSURANCE

## 2015-02-03 ENCOUNTER — Telehealth: Payer: Self-pay | Admitting: *Deleted

## 2015-02-03 DIAGNOSIS — Z369 Encounter for antenatal screening, unspecified: Secondary | ICD-10-CM

## 2015-02-03 DIAGNOSIS — Z131 Encounter for screening for diabetes mellitus: Secondary | ICD-10-CM

## 2015-02-03 LAB — FETAL FIBRONECTIN: FETAL FIBRONECTIN: POSITIVE — AB

## 2015-02-03 NOTE — Telephone Encounter (Signed)
Pt informed of informed of positive FFN (02/02/2015)and no action needed per Nigel Berthold, CNM note on lab. 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 Pt informed per Anderson Malta Griffin,NP no lifting greater than 25 lbs and would advise the pt to stop exercise regimen.

## 2015-02-03 NOTE — Telephone Encounter (Signed)
Spoke with pt. Pt has had a + FFN and was wondering if she can still exercise and weight tables. Pt was advised not to exercise or weight tables at this time. [place]

## 2015-02-04 LAB — HIV ANTIBODY (ROUTINE TESTING W REFLEX): HIV SCREEN 4TH GENERATION: NONREACTIVE

## 2015-02-04 LAB — CBC
HEMOGLOBIN: 13.1 g/dL (ref 11.1–15.9)
Hematocrit: 37.7 % (ref 34.0–46.6)
MCH: 31.2 pg (ref 26.6–33.0)
MCHC: 34.7 g/dL (ref 31.5–35.7)
MCV: 90 fL (ref 79–97)
PLATELETS: 133 10*3/uL — AB (ref 150–379)
RBC: 4.2 x10E6/uL (ref 3.77–5.28)
RDW: 13.4 % (ref 12.3–15.4)
WBC: 7.5 10*3/uL (ref 3.4–10.8)

## 2015-02-04 LAB — RPR: RPR: NONREACTIVE

## 2015-02-04 LAB — GLUCOSE TOLERANCE, 2 HOURS W/ 1HR
Glucose, 1 hour: 97 mg/dL (ref 65–179)
Glucose, 2 hour: 79 mg/dL (ref 65–152)
Glucose, Fasting: 86 mg/dL (ref 65–91)

## 2015-02-04 LAB — ANTIBODY SCREEN: Antibody Screen: NEGATIVE

## 2015-02-04 NOTE — Progress Notes (Signed)
Ffn+ .  Cx Ft outer os, closed inner os.  Long, no pp felt.  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  Was advised by NP not to do either.  Pt has F/U in 3 weeks.  Consider repeating FFN and checking cervix.  If pt is motivated to want to resume work/excercise in the absence of PTL sx, cervical change, it should be considered. CRESENZO-DISHMAN,Zylah [place]

## 2015-02-07 ENCOUNTER — Telehealth: Payer: Self-pay | Admitting: Advanced Practice Midwife

## 2015-02-08 MED ORDER — CITRANATAL ASSURE 35-1 & 300 MG PO MISC: ORAL | Status: DC

## 2015-02-08 NOTE — Telephone Encounter (Signed)
Prenatal vit was sent to pharmacy. Unable to reach pt due to mailbox was full. [place]

## 2015-02-11 ENCOUNTER — Telehealth: Payer: Self-pay | Admitting: Adult Health

## 2015-02-11 MED ORDER — PRENATAL PLUS 27-1 MG PO TABS: 1.0000 | ORAL_TABLET | Freq: Every day | ORAL | Status: DC

## 2015-02-11 NOTE — Telephone Encounter (Signed)
Spoke with pt. Citranatal prenatal is $40.00. Can you prescribe Prenatal Plus? That is $5.00 with pt's insurance. Thanks! [place]

## 2015-02-11 NOTE — Telephone Encounter (Signed)
willl rx prenatal plus

## 2015-02-23 ENCOUNTER — Telehealth: Payer: Self-pay | Admitting: Advanced Practice Midwife

## 2015-02-23 NOTE — Telephone Encounter (Signed)
Pt c/o thick yellowish vaginal discharge, no odor. Pt states discharge has not changed in color just increased in amount today only, +FM, no vaginal bleeding. Pt encouraged to keep her appt for tomorrow for evaluation. Pt verbalized understanding.

## 2015-02-24 ENCOUNTER — Ambulatory Visit (INDEPENDENT_AMBULATORY_CARE_PROVIDER_SITE_OTHER): Payer: PRIVATE HEALTH INSURANCE | Admitting: Advanced Practice Midwife

## 2015-02-24 VITALS — BP 108/70 | HR 82 | Wt 175.0 lb

## 2015-02-24 DIAGNOSIS — Z3A31 31 weeks gestation of pregnancy: Secondary | ICD-10-CM

## 2015-02-24 DIAGNOSIS — Z3493 Encounter for supervision of normal pregnancy, unspecified, third trimester: Secondary | ICD-10-CM

## 2015-02-24 DIAGNOSIS — Z331 Pregnant state, incidental: Secondary | ICD-10-CM

## 2015-02-24 DIAGNOSIS — Z1389 Encounter for screening for other disorder: Secondary | ICD-10-CM

## 2015-02-24 LAB — POCT URINALYSIS DIPSTICK
Blood, UA: NEGATIVE
Glucose, UA: NEGATIVE
Ketones, UA: NEGATIVE
LEUKOCYTES UA: NEGATIVE
Nitrite, UA: NEGATIVE
PROTEIN UA: NEGATIVE

## 2015-02-24 NOTE — Progress Notes (Signed)
G2P0010 [redacted]w[redacted]d Estimated Date of Delivery: 04/24/15  Blood pressure 108/70, pulse 82, weight 175 lb (79.379 kg), last menstrual period 07/17/2014.   BP weight and urine results all reviewed and noted.  Please refer to the obstetrical flow sheet for the fundal height and fetal heart rate documentation:  Patient reports good fetal movement, denies any bleeding and no rupture of membranes symptoms or regular contractions. Patient had some yellow DC yesgterday, no itch/irritaiton.  SSE:  Normal dc today.  All questions were answered.  Orders Placed This Encounter  Procedures  . POCT urinalysis dipstick    Plan:  Continued routine obstetrical care,   Return in about 2 weeks (around 03/10/2015) for LROB.

## 2015-02-24 NOTE — Patient Instructions (Signed)
Mirena Nexplanon

## 2015-03-10 ENCOUNTER — Encounter: Payer: PRIVATE HEALTH INSURANCE | Admitting: Women's Health

## 2015-03-14 ENCOUNTER — Encounter: Payer: Self-pay | Admitting: Women's Health

## 2015-03-14 ENCOUNTER — Ambulatory Visit (INDEPENDENT_AMBULATORY_CARE_PROVIDER_SITE_OTHER): Payer: PRIVATE HEALTH INSURANCE | Admitting: Women's Health

## 2015-03-14 VITALS — BP 124/80 | HR 82 | Wt 180.0 lb

## 2015-03-14 DIAGNOSIS — Z3A34 34 weeks gestation of pregnancy: Secondary | ICD-10-CM

## 2015-03-14 DIAGNOSIS — Z3483 Encounter for supervision of other normal pregnancy, third trimester: Secondary | ICD-10-CM

## 2015-03-14 DIAGNOSIS — Z1389 Encounter for screening for other disorder: Secondary | ICD-10-CM

## 2015-03-14 DIAGNOSIS — Z331 Pregnant state, incidental: Secondary | ICD-10-CM

## 2015-03-14 DIAGNOSIS — Z3493 Encounter for supervision of normal pregnancy, unspecified, third trimester: Secondary | ICD-10-CM

## 2015-03-14 LAB — POCT URINALYSIS DIPSTICK
Blood, UA: NEGATIVE
Glucose, UA: NEGATIVE
KETONES UA: NEGATIVE
LEUKOCYTES UA: NEGATIVE
Nitrite, UA: NEGATIVE
PROTEIN UA: NEGATIVE

## 2015-03-14 NOTE — Progress Notes (Signed)
Low-risk OB appointment G2P0010 [redacted]w[redacted]d Estimated Date of Delivery: 04/24/15 BP 124/80 mmHg  Pulse 82  Wt 180 lb (81.647 kg)  LMP 07/17/2014  BP, weight, and urine reviewed.  Refer to obstetrical flow sheet for FH & FHR.  Reports good fm.  Denies regular uc's, lof, vb, or uti s/s. No complaints. Reviewed ptl s/s, fkc. Plan:  Continue routine obstetrical care  F/U in 2wks for OB appointment

## 2015-03-14 NOTE — Patient Instructions (Signed)
Call the office ([phone]) or go to [facility] if:  You begin to have strong, frequent contractions  Your water breaks.  Sometimes it is a big gush of fluid, sometimes it is just a trickle that keeps getting your panties wet or running down your legs  You have vaginal bleeding.  It is normal to have a small amount of spotting if your cervix was checked.   You don't feel your baby moving like normal.  If you don't, get you something to eat and drink and lay down and focus on feeling your baby move.  You should feel at least 10 movements in 2 hours.  If you don't, you should call the office or go to [facility].    Preterm Labor Information Preterm labor is when labor starts at less than 37 weeks of pregnancy. The normal length of a pregnancy is 39 to 41 weeks. CAUSES Often, there is no identifiable underlying cause as to why a woman goes into preterm labor. One of the most common known causes of preterm labor is infection. Infections of the uterus, cervix, vagina, amniotic sac, bladder, kidney, or even the lungs (pneumonia) can cause labor to start. Other suspected causes of preterm labor include:   Urogenital infections, such as yeast infections and bacterial vaginosis.   Uterine abnormalities (uterine shape, uterine septum, fibroids, or bleeding from the placenta).   A cervix that has been operated on (it may fail to stay closed).   Malformations in the fetus.   Multiple gestations (twins, triplets, and so on).   Breakage of the amniotic sac.  RISK FACTORS  Having a previous history of preterm labor.   Having premature rupture of membranes (PROM).   Having a placenta that covers the opening of the cervix (placenta previa).   Having a placenta that separates from the uterus (placental abruption).   Having a cervix that is too weak to hold the fetus in the uterus (incompetent cervix).   Having too much fluid in the amniotic sac (polyhydramnios).   Taking  illegal drugs or smoking while pregnant.   Not gaining enough weight while pregnant.   Being younger than 18 and older than 33 years old.   Having a low socioeconomic status.   Being African American. SYMPTOMS Signs and symptoms of preterm labor include:   Menstrual-like cramps, abdominal pain, or back pain.  Uterine contractions that are regular, as frequent as six in an hour, regardless of their intensity (may be mild or painful).  Contractions that start on the top of the uterus and spread down to the lower abdomen and back.   A sense of increased pelvic pressure.   A watery or bloody mucus discharge that comes from the vagina.  TREATMENT Depending on the length of the pregnancy and other circumstances, your health care provider may suggest bed rest. If necessary, there are medicines that can be given to stop contractions and to mature the fetal lungs. If labor happens before 34 weeks of pregnancy, a prolonged hospital stay may be recommended. Treatment depends on the condition of both you and the fetus.  WHAT SHOULD YOU DO IF YOU THINK YOU ARE IN PRETERM LABOR? Call your health care provider right away. You will need to go to the hospital to get checked immediately. HOW CAN YOU PREVENT PRETERM LABOR IN FUTURE PREGNANCIES? You should:   Stop smoking if you smoke.  Maintain healthy weight gain and avoid chemicals and drugs that are not necessary.  Be watchful for   any type of infection.  Inform your health care provider if you have a known history of preterm labor.   This information is not intended to replace advice given to you by your health care provider. Make sure you discuss any questions you have with your health care provider.   Document Released: 06/02/2003 Document Revised: 11/12/2012 Document Reviewed: 04/14/2012 Elsevier Interactive Patient Education 2016 [employer]  

## 2015-03-24 ENCOUNTER — Inpatient Hospital Stay (HOSPITAL_COMMUNITY)
Admission: AD | Admit: 2015-03-24 | Discharge: 2015-03-26 | DRG: 775 | Disposition: A | Discharge status: Home / Self Care | Payer: PRIVATE HEALTH INSURANCE | Source: Ambulatory Visit | Attending: Obstetrics & Gynecology | Admitting: Obstetrics & Gynecology

## 2015-03-24 ENCOUNTER — Encounter (HOSPITAL_COMMUNITY): Payer: Self-pay

## 2015-03-24 ENCOUNTER — Inpatient Hospital Stay (HOSPITAL_COMMUNITY): Payer: PRIVATE HEALTH INSURANCE | Admitting: Anesthesiology

## 2015-03-24 DIAGNOSIS — Z141 Cystic fibrosis carrier: Secondary | ICD-10-CM

## 2015-03-24 DIAGNOSIS — Z833 Family history of diabetes mellitus: Secondary | ICD-10-CM | POA: Diagnosis not present

## 2015-03-24 DIAGNOSIS — K219 Gastro-esophageal reflux disease without esophagitis: Secondary | ICD-10-CM | POA: Diagnosis present

## 2015-03-24 DIAGNOSIS — Z8249 Family history of ischemic heart disease and other diseases of the circulatory system: Secondary | ICD-10-CM

## 2015-03-24 DIAGNOSIS — O9962 Diseases of the digestive system complicating childbirth: Secondary | ICD-10-CM | POA: Diagnosis present

## 2015-03-24 DIAGNOSIS — Z3A35 35 weeks gestation of pregnancy: Secondary | ICD-10-CM

## 2015-03-24 DIAGNOSIS — IMO0001 Reserved for inherently not codable concepts without codable children: Secondary | ICD-10-CM

## 2015-03-24 LAB — TYPE AND SCREEN
ABO/RH(D): O POS
Antibody Screen: NEGATIVE

## 2015-03-24 LAB — URINALYSIS, ROUTINE W REFLEX MICROSCOPIC
Bilirubin Urine: NEGATIVE
GLUCOSE, UA: NEGATIVE mg/dL
HGB URINE DIPSTICK: NEGATIVE
Ketones, ur: NEGATIVE mg/dL
LEUKOCYTES UA: NEGATIVE
Nitrite: NEGATIVE
PH: 6.5 (ref 5.0–8.0)
PROTEIN: NEGATIVE mg/dL
SPECIFIC GRAVITY, URINE: 1.01 (ref 1.005–1.030)

## 2015-03-24 LAB — CBC
HEMATOCRIT: 37.4 % (ref 36.0–46.0)
Hemoglobin: 13.5 g/dL (ref 12.0–15.0)
MCH: 31 pg (ref 26.0–34.0)
MCHC: 36.1 g/dL — ABNORMAL HIGH (ref 30.0–36.0)
MCV: 85.8 fL (ref 78.0–100.0)
PLATELETS: 144 10*3/uL — AB (ref 150–400)
RBC: 4.36 MIL/uL (ref 3.87–5.11)
RDW: 13 % (ref 11.5–15.5)
WBC: 12.3 10*3/uL — ABNORMAL HIGH (ref 4.0–10.5)

## 2015-03-24 LAB — ABO/RH: ABO/RH(D): O POS

## 2015-03-24 LAB — RPR: RPR Ser Ql: NONREACTIVE

## 2015-03-24 MED ORDER — WITCH HAZEL-GLYCERIN EX PADS: 1.0000 "application " | MEDICATED_PAD | CUTANEOUS | Status: DC | PRN

## 2015-03-24 MED ORDER — LACTATED RINGERS IV SOLN: 500.0000 mL | INTRAVENOUS | Status: DC | PRN

## 2015-03-24 MED ORDER — CITRIC ACID-SODIUM CITRATE 334-500 MG/5ML PO SOLN: 30.0000 mL | ORAL | Status: DC | PRN

## 2015-03-24 MED ORDER — DIPHENHYDRAMINE HCL 50 MG/ML IJ SOLN: 12.5000 mg | INTRAMUSCULAR | Status: DC | PRN

## 2015-03-24 MED ORDER — ACETAMINOPHEN 325 MG PO TABS
650.0000 mg | ORAL_TABLET | ORAL | Status: DC | PRN
  Administered 2015-03-24 – 2015-03-26 (×3): 650 mg via ORAL
  Filled 2015-03-24 (×3): qty 2

## 2015-03-24 MED ORDER — SODIUM CHLORIDE 0.9 % IV SOLN
2.0000 g | Freq: Four times a day (QID) | INTRAVENOUS | Status: DC
  Administered 2015-03-24: 2 g via INTRAVENOUS
  Filled 2015-03-24 (×2): qty 2000

## 2015-03-24 MED ORDER — OXYCODONE-ACETAMINOPHEN 5-325 MG PO TABS: 2.0000 | ORAL_TABLET | ORAL | Status: DC | PRN

## 2015-03-24 MED ORDER — ONDANSETRON HCL 4 MG PO TABS
4.0000 mg | ORAL_TABLET | ORAL | Status: DC | PRN
Start: 2015-03-24 — End: 2015-03-26

## 2015-03-24 MED ORDER — PRENATAL MULTIVITAMIN CH
1.0000 | ORAL_TABLET | Freq: Every day | ORAL | Status: DC
  Administered 2015-03-24 – 2015-03-26 (×3): 1 via ORAL
  Filled 2015-03-24 (×3): qty 1

## 2015-03-24 MED ORDER — BENZOCAINE-MENTHOL 20-0.5 % EX AERO
1.0000 "application " | INHALATION_SPRAY | CUTANEOUS | Status: DC | PRN
  Administered 2015-03-24: 1 via TOPICAL
  Filled 2015-03-24: qty 56

## 2015-03-24 MED ORDER — LANOLIN HYDROUS EX OINT: TOPICAL_OINTMENT | CUTANEOUS | Status: DC | PRN

## 2015-03-24 MED ORDER — DIBUCAINE 1 % RE OINT: 1.0000 "application " | TOPICAL_OINTMENT | RECTAL | Status: DC | PRN

## 2015-03-24 MED ORDER — LIDOCAINE HCL (PF) 1 % IJ SOLN
30.0000 mL | INTRAMUSCULAR | Status: AC | PRN
  Administered 2015-03-24: 30 mL via SUBCUTANEOUS
  Filled 2015-03-24 (×2): qty 30

## 2015-03-24 MED ORDER — EPHEDRINE 5 MG/ML INJ
10.0000 mg | INTRAVENOUS | Status: DC | PRN
  Filled 2015-03-24: qty 2

## 2015-03-24 MED ORDER — LACTATED RINGERS IV SOLN
INTRAVENOUS | Status: DC
  Administered 2015-03-24: 05:00:00 via INTRAVENOUS

## 2015-03-24 MED ORDER — PHENYLEPHRINE 40 MCG/ML (10ML) SYRINGE FOR IV PUSH (FOR BLOOD PRESSURE SUPPORT)
80.0000 ug | PREFILLED_SYRINGE | INTRAVENOUS | Status: DC | PRN
  Filled 2015-03-24: qty 2

## 2015-03-24 MED ORDER — ZOLPIDEM TARTRATE 5 MG PO TABS: 5.0000 mg | ORAL_TABLET | Freq: Every evening | ORAL | Status: DC | PRN

## 2015-03-24 MED ORDER — BETAMETHASONE SOD PHOS & ACET 6 (3-3) MG/ML IJ SUSP
12.0000 mg | INTRAMUSCULAR | Status: DC
  Administered 2015-03-24: 12 mg via INTRAMUSCULAR
  Filled 2015-03-24: qty 2

## 2015-03-24 MED ORDER — SIMETHICONE 80 MG PO CHEW: 80.0000 mg | CHEWABLE_TABLET | ORAL | Status: DC | PRN

## 2015-03-24 MED ORDER — FENTANYL 2.5 MCG/ML BUPIVACAINE 1/10 % EPIDURAL INFUSION (WH - ANES)
14.0000 mL/h | INTRAMUSCULAR | Status: DC | PRN
  Administered 2015-03-24: 14 mL/h via EPIDURAL

## 2015-03-24 MED ORDER — OXYCODONE-ACETAMINOPHEN 5-325 MG PO TABS
1.0000 | ORAL_TABLET | ORAL | Status: DC | PRN
  Administered 2015-03-25: 1 via ORAL
  Filled 2015-03-24: qty 1

## 2015-03-24 MED ORDER — FENTANYL 2.5 MCG/ML BUPIVACAINE 1/10 % EPIDURAL INFUSION (WH - ANES)
INTRAMUSCULAR | Status: AC
  Filled 2015-03-24: qty 125

## 2015-03-24 MED ORDER — FENTANYL CITRATE (PF) 100 MCG/2ML IJ SOLN: 100.0000 ug | INTRAMUSCULAR | Status: DC | PRN

## 2015-03-24 MED ORDER — OXYTOCIN BOLUS FROM INFUSION
500.0000 mL | INTRAVENOUS | Status: DC
  Administered 2015-03-24: 500 mL via INTRAVENOUS

## 2015-03-24 MED ORDER — SODIUM CHLORIDE 0.9 % IV SOLN
2.0000 g | Freq: Once | INTRAVENOUS | Status: AC
  Administered 2015-03-24: 2 g via INTRAVENOUS
  Filled 2015-03-24: qty 2000

## 2015-03-24 MED ORDER — SENNOSIDES-DOCUSATE SODIUM 8.6-50 MG PO TABS
2.0000 | ORAL_TABLET | ORAL | Status: DC
  Administered 2015-03-24 – 2015-03-26 (×2): 2 via ORAL
  Filled 2015-03-24 (×2): qty 2

## 2015-03-24 MED ORDER — ONDANSETRON HCL 4 MG/2ML IJ SOLN: 4.0000 mg | INTRAMUSCULAR | Status: DC | PRN

## 2015-03-24 MED ORDER — ONDANSETRON HCL 4 MG/2ML IJ SOLN: 4.0000 mg | Freq: Four times a day (QID) | INTRAMUSCULAR | Status: DC | PRN

## 2015-03-24 MED ORDER — IBUPROFEN 600 MG PO TABS
600.0000 mg | ORAL_TABLET | Freq: Four times a day (QID) | ORAL | Status: DC
  Administered 2015-03-24 – 2015-03-26 (×10): 600 mg via ORAL
  Filled 2015-03-24 (×10): qty 1

## 2015-03-24 MED ORDER — PHENYLEPHRINE 40 MCG/ML (10ML) SYRINGE FOR IV PUSH (FOR BLOOD PRESSURE SUPPORT)
PREFILLED_SYRINGE | INTRAVENOUS | Status: AC
  Filled 2015-03-24: qty 20

## 2015-03-24 MED ORDER — ACETAMINOPHEN 325 MG PO TABS: 650.0000 mg | ORAL_TABLET | ORAL | Status: DC | PRN

## 2015-03-24 MED ORDER — OXYTOCIN 40 UNITS IN LACTATED RINGERS INFUSION - SIMPLE MED
62.5000 mL/h | INTRAVENOUS | Status: DC
  Administered 2015-03-24: 62.5 mL/h via INTRAVENOUS
  Filled 2015-03-24 (×2): qty 1000

## 2015-03-24 MED ORDER — DIPHENHYDRAMINE HCL 25 MG PO CAPS: 25.0000 mg | ORAL_CAPSULE | Freq: Four times a day (QID) | ORAL | Status: DC | PRN

## 2015-03-24 MED ORDER — OXYCODONE-ACETAMINOPHEN 5-325 MG PO TABS: 1.0000 | ORAL_TABLET | ORAL | Status: DC | PRN

## 2015-03-24 MED ORDER — TETANUS-DIPHTH-ACELL PERTUSSIS 5-2.5-18.5 LF-MCG/0.5 IM SUSP: 0.5000 mL | Freq: Once | INTRAMUSCULAR | Status: DC

## 2015-03-24 MED ORDER — LIDOCAINE HCL (PF) 1 % IJ SOLN
INTRAMUSCULAR | Status: DC | PRN
  Administered 2015-03-24 (×2): 4 mL via EPIDURAL

## 2015-03-24 NOTE — H&P (Signed)
Emily Chan is a 34 y.o. female G2P0010 @ 35.4wks by 8wk scan presenting for reg ctx since 2100 which began getting stronger @ 2300. Denies leaking or bldg; reports +FM. 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 Abnormal Pap smear   . Hx: UTI (urinary tract infection)   . Contraception management 06/16/2012  . Vaginal irritation 12/25/2012  . Yeast infection 12/25/2012  . Vaginal Pap smear, abnormal   . Pregnant 01/28/2014  . Miscarriage   . Abdominal cramping affecting pregnancy 08/24/2014  . Bleeding in early pregnancy 09/02/2014    Pelvic rest   Past Surgical History  Procedure Laterality Date  . Appendectomy    . Wisdom tooth extraction     Family History: family history includes Cancer in her maternal grandfather, paternal aunt, and paternal grandmother; Diabetes in her maternal grandfather and maternal grandmother; Hypertension in her maternal grandmother. Social History:  reports that she has never smoked. She has never used smokeless tobacco. She reports that she does not drink alcohol or use illicit drugs.   Prenatal Transfer Tool  Maternal Diabetes: No Genetic Screening: Declined Maternal Ultrasounds/Referrals: Normal Fetal Ultrasounds or other Referrals:  None Maternal Substance Abuse:  No Significant Maternal Medications:  None Significant Maternal Lab Results:  Lab values include: Other: GBS unk Other Comments:  None  ROS  Dilation: 6 Effacement (%): 80 Station: -1 Exam by:: Derrill Memo CNM Blood pressure 119/72, pulse 86, temperature 98.7 F (37.1 C), temperature source Oral, resp. rate 18, height 5\' 4"  (1.626 m), weight 81.647 kg (180 lb), last menstrual period  07/17/2014, SpO2 98 %. Exam Physical Exam  Constitutional: She is oriented to person, place, and time. She appears well-developed.  HENT:  Head: Normocephalic.  Neck: Normal range of motion.  Cardiovascular: Normal rate.   Respiratory: Effort normal.  GI:  EFM 130s, +accels, no decels Ctx q 2-4 mins  Genitourinary: Vagina normal.  sm bloody show w/ exam  Musculoskeletal: Normal range of motion.  Neurological: She is alert and oriented to person, place, and time.  Skin: Skin is warm and dry.  Psychiatric: She has a normal mood and affect. Her behavior is normal. Thought content normal.    Prenatal labs: ABO, Rh: O/Positive/-- (06/27 0925) Antibody: Negative (11/10 0854) Rubella: 1.92 (06/27 0925) RPR: Non Reactive (11/10 0854)  HBsAg: Negative (06/27 0925)  HIV: Non Reactive (11/10 0854)  GBS:   unk  Assessment/Plan: IUP@35 .4wks Active labor GBS unknown   Admit to [facility] for GBS ppx Expectant management  Stepfanie Yott CNM 03/24/2015, 2:23 AM

## 2015-03-24 NOTE — Lactation Note (Signed)
This note was copied from the chart of Emily Chan. Lactation Consultation Note  Patient Name: Emily Petty Graydon [id number] Date: 03/24/2015 Reason for consult: Initial assessment;Infant < 6lbs NICU baby 65 hours old, [redacted]w[redacted]d GA. Assisted mom with hand expression prior to mom going to NICU to visit with baby--with no colostrum visible at this time. Discussed normal progression of milk coming to volume. Mom took a nap after returning from NICU and did not call out for assistance with pumping. After mom's nap, assisted with starting to use DEBP. Fitted mom with #21 flanges and mom pumped for 15 minutes with no colostrum visible. Mom has small, round breasts with small, well-everted nipples. Enc mom to pump 8 times/24 hours for 15 minutes followed by hand expression. Reviewed EBM storage guidelines and enc taking colostrum to NICU as able. Enc mom to offer STS/Kangaroo care as baby able as well.   Mom given [facility] brochure, aware of OP/BFSG and [place] phone line assistance after D/C. Mom has her own personal DEBP and is aware of pumping rooms in the NICU. Given NICU booklet with review as well.   Maternal Data Has patient been taught Hand Expression?: Yes Does the patient have breastfeeding experience prior to this delivery?: No  Feeding    LATCH Score/Interventions                      Lactation Tools Discussed/Used WIC Program: No Pump Review: Milk Storage;Setup, frequency, and cleaning Initiated by:: JW Date initiated:: 03/25/15   Consult Status Consult Status: Follow-up Date: 03/25/15 Follow-up type: In-patient    Inocente Salles 03/24/2015, 6:24 PM

## 2015-03-24 NOTE — Progress Notes (Signed)
Emily Chan is a 34 y.o. G2P0010 at [redacted]w[redacted]d   Subjective: Comfortable w/ epidural  Objective: BP 106/65 mmHg  Pulse 86  Temp(Src) 98.3 F (36.8 C) (Oral)  Resp 16  Ht 5\' 4"  (1.626 m)  Wt 81.647 kg (180 lb)  BMI 30.88 kg/m2  SpO2 99%  LMP 07/17/2014      FHT:  FHR: 140s bpm, variability: moderate,  accelerations:  Present,  decelerations:  Absent UC:   regular, every 3-4 minutes SVE:   Dilation: Lip/rim Effacement (%): 100 Station: 0 Exam by:: Emily Chan,cnm- AROM- clear/bld tinged fluid  Labs: Lab Results  Component Value Date   WBC 12.3* 03/24/2015   HGB 13.5 03/24/2015   HCT 37.4 03/24/2015   MCV 85.8 03/24/2015   PLT 144* 03/24/2015    Assessment / Plan: IUP@35 .4wks Preterm labor End 1st stage  Check cx in 2 hrs or sooner w/ increased pressure  Emily Chan CNM 03/24/2015, 8:00 AM

## 2015-03-24 NOTE — Progress Notes (Signed)
RN walks into room to perform fundal massage on mother.  Infant skin to skin with mother and has notable grunting.  RN places o2 prob on infant and spo2 is 79.  Infant taken to warmer and sats increase to 87.  Nursery called and immediately comes to assess infant.  Blow by given for approximately 30 sec and o2 sats increase to 94%.  Parents educated on infants need for more monitoring.  Infant taken to nursery.

## 2015-03-24 NOTE — Anesthesia Preprocedure Evaluation (Signed)
Anesthesia Evaluation  Patient identified by MRN, date of birth, ID band Patient awake    Reviewed: Allergy & Precautions, Patient's Chart, lab work & pertinent test results  Airway Mallampati: II  TM Distance: >3 FB Neck ROM: Full    Dental no notable dental hx. (+) Teeth Intact   Pulmonary neg pulmonary ROS,    Pulmonary exam normal breath sounds clear to auscultation       Cardiovascular negative cardio ROS Normal cardiovascular exam Rhythm:Regular Rate:Normal     Neuro/Psych negative neurological ROS  negative psych ROS   GI/Hepatic Neg liver ROS, GERD  ,  Endo/Other  Obesity  Renal/GU negative Renal ROS  negative genitourinary   Musculoskeletal negative musculoskeletal ROS (+)   Abdominal (+) + obese,   Peds  Hematology  (+) anemia ,   Anesthesia Other Findings   Reproductive/Obstetrics (+) Pregnancy 35 weeks PTL                             Anesthesia Physical Anesthesia Plan  ASA: II  Anesthesia Plan: Epidural   Post-op Pain Management:    Induction:   Airway Management Planned:   Additional Equipment:   Intra-op Plan:   Post-operative Plan:   Informed Consent: I have reviewed the patients History and Physical, chart, labs and discussed the procedure including the risks, benefits and alternatives for the proposed anesthesia with the patient or authorized representative who has indicated his/her understanding and acceptance.     Plan Discussed with: Anesthesiologist  Anesthesia Plan Comments:         Anesthesia Quick Evaluation

## 2015-03-24 NOTE — MAU Note (Signed)
Pt reports contractions q 5 minutes. Denies bleeding. Reports urinary frequency.

## 2015-03-24 NOTE — Anesthesia Procedure Notes (Signed)
Epidural Patient location during procedure: OB Start time: 03/24/2015 3:17 AM  Staffing Anesthesiologist: Josephine Igo Performed by: anesthesiologist   Preanesthetic Checklist Completed: patient identified, site marked, surgical consent, pre-op evaluation, timeout performed, IV checked, risks and benefits discussed and monitors and equipment checked  Epidural Patient position: sitting Prep: site prepped and draped and DuraPrep Patient monitoring: continuous pulse ox and blood pressure Approach: midline Location: L3-L4 Injection technique: LOR air  Needle:  Needle type: Tuohy  Needle gauge: 17 G Needle length: 9 cm and 9 Needle insertion depth: 5 cm cm Catheter type: closed end flexible Catheter size: 19 Gauge Catheter at skin depth: 10 cm Test dose: negative and Other  Assessment Events: blood not aspirated, injection not painful, no injection resistance, negative IV test and no paresthesia  Additional Notes Patient identified. Risks and benefits discussed including failed block, incomplete  Pain control, post dural puncture headache, nerve damage, paralysis, blood pressure Changes, nausea, vomiting, reactions to medications-both toxic and allergic and post Partum back pain. All questions were answered. Patient expressed understanding and wished to proceed. Sterile technique was used throughout procedure. Epidural site was Dressed with sterile barrier dressing. No paresthesias, signs of intravascular injection Or signs of intrathecal spread were encountered.  Patient was more comfortable after the epidural was dosed. Please see RN's note for documentation of vital signs and FHR which are stable.

## 2015-03-25 NOTE — Progress Notes (Signed)
Post Partum Day 1 Subjective: no complaints, up ad lib, voiding and tolerating PO, small lochia, plans to breastfeed, unsure of BC, maybe nexplanon  Objective: Blood pressure 104/61, pulse 71, temperature 98 F (36.7 C), temperature source Oral, resp. rate 18, height 5\' 4"  (1.626 m), weight 81.647 kg (180 lb), last menstrual period 07/17/2014, SpO2 99 %, unknown if currently breastfeeding.  Physical Exam:  General: alert, cooperative and no distress Lochia:normal flow Chest: CTAB Heart: RRR no m/r/g Abdomen: +BS, soft, nontender,  Uterine Fundus: firm DVT Evaluation: No evidence of DVT seen on physical exam. Extremities: no edema   Recent Labs  03/24/15 0234  HGB 13.5  HCT 37.4    Assessment/Plan: Plan for discharge tomorrow   LOS: 1 day   CRESENZO-DISHMAN,Jonica Bickhart 03/25/2015, 8:14 AM

## 2015-03-25 NOTE — Anesthesia Postprocedure Evaluation (Signed)
Anesthesia Post Note  Patient: Emily Chan  Procedure(s) Performed: * No procedures listed *  Patient location during evaluation: Mother Baby Anesthesia Type: Epidural Level of consciousness: awake and alert, oriented and patient cooperative Pain management: pain level controlled Vital Signs Assessment: post-procedure vital signs reviewed and stable Respiratory status: spontaneous breathing Cardiovascular status: stable Postop Assessment: no headache, epidural receding, patient able to bend at knees and no signs of nausea or vomiting Anesthetic complications: no    Last Vitals:  Filed Vitals:   03/25/15 0230 03/25/15 0623  BP: 106/59 104/61  Pulse: 74 71  Temp: 37 C 36.7 C  Resp: 16 18    Last Pain:  Filed Vitals:   03/25/15 0624  PainSc: 0-No pain                 [facility]

## 2015-03-25 NOTE — Progress Notes (Signed)
CSW acknowledges NICU admission.    Patient screened out for psychosocial assessment since none of the following apply:  Psychosocial stressors documented in mother or baby's chart  Gestation less than 32 weeks  Code at delivery   Infant with anomalies  Please contact the Clinical Social Worker if specific needs arise, or by MOB's request.       

## 2015-03-25 NOTE — Lactation Note (Signed)
This note was copied from the chart of Emily Cana Oblinger. Lactation Consultation Note   Follow up with this mom of a NICU baby, now 71 hours old and 78 5/[redacted] weeks gestation. Mom is pumping every 3 hours, and has a good supply of colostrum. She sees a decrease today, and I explained this is normal on day 2, and her milk will transition in at 48-72 hours. Mom has a personal DEP at home. I advised mom that when the baby is on a feeding schedule, for her to pump after being with him, as this will increase her supply. Mom doing well, and knows to call for questions/concerns.   Patient Name: Emily Chan [id number] Date: 03/25/2015     Maternal Data    Feeding    LATCH Score/Interventions                      Lactation Tools Discussed/Used     Consult Status      Tonna Corner 03/25/2015, 5:00 PM

## 2015-03-25 NOTE — Lactation Note (Addendum)
This note was copied from the chart of [name]. Lactation Consultation Note  Patient Name: [name] [id number] Date: 03/25/2015 Reason for consult: Follow-up assessment  With this mom and NICU baby, now 15 hours old. I assisted mom with latching the baby for the first time. He was too sleepy, so mom just kept him skin to skin, and was going to bottle feed EBM/formula.  I noted with trying to latch him, that he has an anterior short frenulum.  I advised mom to speak to the NNP/NEOs in the NICU, to make them aware, and to see if any intervention could happen while the baby was in the NICU. I also revieved LPI behavior with the parents, and explained how he would not have the energy or size/stamina to breast feed as a full term baby aat this time. Nuzzling and skin to skin encouraged. I also mentioned how a tight frenulum can also add to reflux issues. I also told parent how botttle feeding should be easier for him, despite the short frenulum. I did fit mom with a 20 nipple shiled, but he was still too sleepy to suckle. Mom know to call for questions/concerns.    Maternal Data    Feeding    LATCH Score/Interventions Latch: Too sleepy or reluctant, no latch achieved, no sucking elicited. Intervention(s): Skin to skin;Teach feeding cues;Waking techniques  Audible Swallowing: None (mom has easily expressed colostrum) Intervention(s): Skin to skin;Hand expression  Type of Nipple: Everted at rest and after stimulation  Comfort (Breast/Nipple): Soft / non-tender     Hold (Positioning): Assistance needed to correctly position infant at breast and maintain latch. Intervention(s): Breastfeeding basics reviewed;Support Pillows;Position options;Skin to skin  LATCH Score: 5  Lactation Tools Discussed/Used     Consult Status Consult Status: Follow-up Date: 03/26/15 Follow-up type: In-patient (NICU)    Emily Chan 03/25/2015, 5:05 PM

## 2015-03-26 MED ORDER — LANOLIN HYDROUS EX OINT: 1.0000 "application " | TOPICAL_OINTMENT | CUTANEOUS | Status: DC | PRN

## 2015-03-26 MED ORDER — OXYCODONE-ACETAMINOPHEN 5-325 MG PO TABS: 1.0000 | ORAL_TABLET | ORAL | Status: DC | PRN

## 2015-03-26 MED ORDER — IBUPROFEN 600 MG PO TABS: 600.0000 mg | ORAL_TABLET | Freq: Four times a day (QID) | ORAL | Status: DC

## 2015-03-26 MED ORDER — BENZOCAINE-MENTHOL 20-0.5 % EX AERO: 1.0000 "application " | INHALATION_SPRAY | CUTANEOUS | Status: DC | PRN

## 2015-03-26 NOTE — Lactation Note (Signed)
This note was copied from the chart of Emily Rayelle Steinmann. Lactation Consultation Note; Mom with baby in NICU. Reports pumping 7 times yesterday. Obtained about 3 ml this morning. Encouragement given. Has Medela pump for home. No questions at present. Will follow in NICU,  Patient Name: Emily Chan [id number] Date: 03/26/2015 Reason for consult: Follow-up assessment;NICU baby   Maternal Data    Feeding   LATCH Score/Interventions                      Lactation Tools Discussed/Used WIC Program: No   Consult Status Consult Status: Complete    Truddie Crumble 03/26/2015, 11:31 AM

## 2015-03-26 NOTE — Discharge Instructions (Signed)
Home Care Instructions for Mom After discharge, you may discover that you still have questions about body changes, activity, and care during the next few weeks. The following information should be helpful in answering many of your questions. ACTIVITY  Gradually resume your daily activities at home.  Allow time for rest periods during the day and nap while your newborn sleeps.  Avoid heavy lifting (more than 10 lb [4.5 kg]) and strenuous work or sports. Slow to moderate walking is usually safe.  If you had a cesarean delivery, do not vacuum, climb stairs, or drive a car for 4 to 6 weeks.  If you had a cesarean delivery, make arrangements for help at home until you feel you are okay to do your usual activities yourself.  Ask your caregiver for safe exercises to do after delivery, especially if you had a cesarean delivery. VAGINAL FLOW AND RETURN OF YOUR MENSTRUAL PERIOD  Vaginal flow may continue for 4 to 6 weeks after delivery.  Usually the amount decreases and the color of blood gets lighter.  Bright red blood and an increased flow may reoccur if you have been too active.  Lie down, raise (elevate) your feet, place a cold compress on your lower abdomen, rest, and call your caregiver if you are soaking more than 1 pad an hour or passing large clots.  Menstrual periods will usually return 6 to 8 weeks after delivery.  If you are breastfeeding, your period will return anywhere from 8 weeks to the time you stop breastfeeding. PERINEAL CARE  Use the peri bottle and change pads each time you go to the bathroom.  Use towelettes in place of toilet paper until stitches are healed.  Take warm tub baths for 15 to 20 minutes.  Continue to use medicated pads or pain relieving spray.  Lidocaine cream for episiotomy pain can be used with your caregiver's approval.  Do not use tampons or douches until vaginal bleeding has stopped (about 4 weeks).  Sexual intercourse should be avoided for at  least 3 to 4 weeks after delivery or until the brownish-red vaginal flow is completely gone.  Wipe from front to back. INCISION CARE (AFTER A CESAREAN DELIVERY)  Shower as desired. Try to keep your incision dry. BOWELS AND HEMORRHOIDS  Drink at least 6 to 8 glasses of non-caffeinated fluids per day.  Eat fiber-rich diet with whole grains, raw fruits, and vegetables.  Take frequent warm tub baths if hemorrhoids are a problem.  Avoid straining when having a bowel movement.  Over-the-counter medicines and stool softeners can be used as directed by your caregiver. NUTRITION  Eat a well-balanced diet that includes the basic food groups.  Do not try to lose weight quickly by cutting back on calories.  If you are breastfeeding, drink at least 8 to 10 glasses of non-caffeinated fluid per day and increase your intake by 600 calories a day.  Take your prenatal vitamins until your postpartum checkup or until your caregiver tells you to stop. BREASTFEEDING If you are not breastfeeding:  Wear a good tight-fitting bra.  Limit fluid intake after 1 or 2 days after delivery, or as directed, if your breasts are becoming engorged.  Avoid nipple stimulation and apply cool (not icy cold) compresses to the breasts for comfort as needed.  Avoid drinking alcohol and caffeinated drinks.  Mild over-the-counter pain medication recommended by your caregiver is helpful for breast discomfort.  Medications to dry up breast milk are not recommended. If you are breastfeeding:  Encourage  your newborn to breastfeed if you think he or she is hungry. °· Wash your hands before breastfeeding. °· Clean your breasts with warm water before nursing. °· Start to encourage feeding 8 to 12 times a day for 10 to 15 minutes on each breast in the beginning to help stimulate milk production and train your newborn. °· Avoid water and formula supplements for your newborn unless otherwise directed. °· Have your newborn seen by  his or her caregiver 3 to 5 days after delivery and again at 2 to 3 weeks to evaluate his or her progress with breastfeeding. °· Call your newborn's caregiver if you think he or she is not gaining weight or may be losing weight. °POSTPARTUM BLUES °Following delivery, your body goes through a drastic change in hormone levels. You may find yourself crying for no apparent reason and unable to cope with all the changes a newborn brings. Get support from your partner and friends. Give yourself time to adjust. If these feelings persist after several weeks, contact your caregiver or other professionals that can help you. °Call your local emergency department, go to the emergency room, or get help right away from a relative, friend, or neighbor if you feel you are about to harm yourself, your newborn, or anyone else. °EXERCISES °Start Kegel exercises right after delivery. You can do it while standing, sitting, or lying down. Tighten your stomach muscles and the muscles surrounding your birth canal. Hold for a few seconds and then relax. Repeat 5 times each time. Make Kegel exercises a part of your daily routine to maintain the muscle tone that supports your vagina, bladder, and bowels. °SELF BREAST EXAMINATION °· Do breast self-exams at the same time of the month, each month. °· Any lump, bump, or discharge should be reported to your caregiver. °· Check your breasts, if you are breastfeeding, just after a feeding when your breasts are less full. If your period has started and you are breastfeeding, check your breasts on the fifth to seventh day of your period. °· Breasts are normally lumpy if you are breastfeeding due to the fullness of the milk cells. This is temporary and not a health risk. °INTIMACY AND SEXUALITY °New parents need time to adjust to each other intimately and sexually after giving birth. Try to spend time as a couple discussing ways to adjust to your infant, your new schedule, and how to meet both your  desires and needs. Counseling can be helpful in deeply troubled cases. °If you are breastfeeding or not yet having a menstrual period, you can get pregnant. Use some form of birth control to prevent getting pregnant. Talk to your caregiver about the birth control choices that are available to you for you situation. °SEEK IMMEDIATE MEDICAL CARE IF:  °· Drainage increases from the Cesarean incision, episiotomy or tear site, or the drainage starts to smell bad. °· You soak pads with blood in 1 hour or less. °· You have severe lower abdominal pain or cramping. °· You have a bad smelling vaginal discharge. °· You have increased rather than decreased pain around stitches or swelling, redness, or hardness in area. °· You have an oral temperature above 102° F (38.9° C), not controlled by medicine. °· You have pain or redness in the calf of the leg. °· You feel sick to your stomach (nausea) and throw up (vomit) for 12 hours. °· You have sudden, severe chest pain. °· You have shortness of breath. °· You have painful or bloody urination. °·   You have visual problems.  You develop a severe headache.  An area of your breast is red and sore, and you have a fever. You may feel like you have flu symptoms.   This information is not intended to replace advice given to you by your health care provider. Make sure you discuss any questions you have with your health care provider.   Document Released: 03/09/2000 Document Revised: 06/04/2011 Document Reviewed: 09/13/2014 Elsevier Interactive Patient Education [employer].  Breastfeeding Deciding to breastfeed is one of the best choices you can make for you and your baby. A change in hormones during pregnancy causes your breast tissue to grow and increases the number and size of your milk ducts. These hormones also allow proteins, sugars, and fats from your blood supply to make breast milk in your milk-producing glands. Hormones prevent breast milk from being released  before your baby is born as well as prompt milk flow after birth. Once breastfeeding has begun, thoughts of your baby, as well as his or her sucking or crying, can stimulate the release of milk from your milk-producing glands.  BENEFITS OF BREASTFEEDING For Your Baby  Your first milk (colostrum) helps your baby's digestive system function better.  There are antibodies in your milk that help your baby fight off infections.  Your baby has a lower incidence of asthma, allergies, and sudden infant death syndrome.  The nutrients in breast milk are better for your baby than infant formulas and are designed uniquely for your baby's needs.  Breast milk improves your baby's brain development.  Your baby is less likely to develop other conditions, such as childhood obesity, asthma, or type 2 diabetes mellitus. For You  Breastfeeding helps to create a very special bond between you and your baby.  Breastfeeding is convenient. Breast milk is always available at the correct temperature and costs nothing.  Breastfeeding helps to burn calories and helps you lose the weight gained during pregnancy.  Breastfeeding makes your uterus contract to its prepregnancy size faster and slows bleeding (lochia) after you give birth.   Breastfeeding helps to lower your risk of developing type 2 diabetes mellitus, osteoporosis, and breast or ovarian cancer later in life. SIGNS THAT YOUR BABY IS HUNGRY Early Signs of Hunger  Increased alertness or activity.  Stretching.  Movement of the head from side to side.  Movement of the head and opening of the mouth when the corner of the mouth or cheek is stroked (rooting).  Increased sucking sounds, smacking lips, cooing, sighing, or squeaking.  Hand-to-mouth movements.  Increased sucking of fingers or hands. Late Signs of Hunger  Fussing.  Intermittent crying. Extreme Signs of Hunger Signs of extreme hunger will require calming and consoling before your  baby will be able to breastfeed successfully. Do not wait for the following signs of extreme hunger to occur before you initiate breastfeeding:  Restlessness.  A loud, strong cry.  Screaming. BREASTFEEDING BASICS Breastfeeding Initiation  Find a comfortable place to sit or lie down, with your neck and back well supported.  Place a pillow or rolled up blanket under your baby to bring him or her to the level of your breast (if you are seated). Nursing pillows are specially designed to help support your arms and your baby while you breastfeed.  Make sure that your baby's abdomen is facing your abdomen.  Gently massage your breast. With your fingertips, massage from your chest wall toward your nipple in a circular motion. This encourages milk flow.  You may need to continue this action during the feeding if your milk flows slowly.  Support your breast with 4 fingers underneath and your thumb above your nipple. Make sure your fingers are well away from your nipple and your baby's mouth.  Stroke your baby's lips gently with your finger or nipple.  When your baby's mouth is open wide enough, quickly bring your baby to your breast, placing your entire nipple and as much of the colored area around your nipple (areola) as possible into your baby's mouth.  More areola should be visible above your baby's upper lip than below the lower lip.  Your baby's tongue should be between his or her lower gum and your breast.  Ensure that your baby's mouth is correctly positioned around your nipple (latched). Your baby's lips should create a seal on your breast and be turned out (everted).  It is common for your baby to suck about 2-3 minutes in order to start the flow of breast milk. Latching Teaching your baby how to latch on to your breast properly is very important. An improper latch can cause nipple pain and decreased milk supply for you and poor weight gain in your baby. Also, if your baby is not  latched onto your nipple properly, he or she may swallow some air during feeding. This can make your baby fussy. Burping your baby when you switch breasts during the feeding can help to get rid of the air. However, teaching your baby to latch on properly is still the best way to prevent fussiness from swallowing air while breastfeeding. Signs that your baby has successfully latched on to your nipple:  Silent tugging or silent sucking, without causing you pain.  Swallowing heard between every 3-4 sucks.  Muscle movement above and in front of his or her ears while sucking. Signs that your baby has not successfully latched on to nipple:  Sucking sounds or smacking sounds from your baby while breastfeeding.  Nipple pain. If you think your baby has not latched on correctly, slip your finger into the corner of your baby's mouth to break the suction and place it between your baby's gums. Attempt breastfeeding initiation again. Signs of Successful Breastfeeding Signs from your baby:  A gradual decrease in the number of sucks or complete cessation of sucking.  Falling asleep.  Relaxation of his or her body.  Retention of a small amount of milk in his or her mouth.  Letting go of your breast by himself or herself. Signs from you:  Breasts that have increased in firmness, weight, and size 1-3 hours after feeding.  Breasts that are softer immediately after breastfeeding.  Increased milk volume, as well as a change in milk consistency and color by the fifth day of breastfeeding.  Nipples that are not sore, cracked, or bleeding. Signs That Your [employer] is Getting Enough Milk  Wetting at least 3 diapers in a 24-hour period. The urine should be clear and pale yellow by age 398 days.  At least 3 stools in a 24-hour period by age 398 days. The stool should be soft and yellow.  At least 3 stools in a 24-hour period by age 600 days. The stool should be seedy and yellow.  No loss of weight greater than  10% of birth weight during the first 26 days of age.  Average weight gain of 4-7 ounces (113-198 g) per week after age 90 days.  Consistent daily weight gain by age 398 days, without weight loss after  the age of 2 weeks. After a feeding, your baby may spit up a small amount. This is common. BREASTFEEDING FREQUENCY AND DURATION Frequent feeding will help you make more milk and can prevent sore nipples and breast engorgement. Breastfeed when you feel the need to reduce the fullness of your breasts or when your baby shows signs of hunger. This is called "breastfeeding on demand." Avoid introducing a pacifier to your baby while you are working to establish breastfeeding (the first 4-6 weeks after your baby is born). After this time you may choose to use a pacifier. Research has shown that pacifier use during the first year of a baby's life decreases the risk of sudden infant death syndrome (SIDS). Allow your baby to feed on each breast as long as he or she wants. Breastfeed until your baby is finished feeding. When your baby unlatches or falls asleep while feeding from the first breast, offer the second breast. Because newborns are often sleepy in the first few weeks of life, you may need to awaken your baby to get him or her to feed. Breastfeeding times will vary from baby to baby. However, the following rules can serve as a guide to help you ensure that your baby is properly fed:  Newborns (babies 33 weeks of age or younger) may breastfeed every 1-3 hours.  Newborns should not go longer than 3 hours during the day or 5 hours during the night without breastfeeding.  You should breastfeed your baby a minimum of 8 times in a 24-hour period until you begin to introduce solid foods to your baby at around 94 months of age. BREAST MILK PUMPING Pumping and storing breast milk allows you to ensure that your baby is exclusively fed your breast milk, even at times when you are unable to breastfeed. This is especially  important if you are going back to work while you are still breastfeeding or when you are not able to be present during feedings. Your lactation consultant can give you guidelines on how long it is safe to store breast milk. A breast pump is a machine that allows you to pump milk from your breast into a sterile bottle. The pumped breast milk can then be stored in a refrigerator or freezer. Some breast pumps are operated by hand, while others use electricity. Ask your lactation consultant which type will work best for you. Breast pumps can be purchased, but some hospitals and breastfeeding support groups lease breast pumps on a monthly basis. A lactation consultant can teach you how to hand express breast milk, if you prefer not to use a pump. CARING FOR YOUR BREASTS WHILE YOU BREASTFEED Nipples can become dry, cracked, and sore while breastfeeding. The following recommendations can help keep your breasts moisturized and healthy:  Avoid using soap on your nipples.  Wear a supportive bra. Although not required, special nursing bras and tank tops are designed to allow access to your breasts for breastfeeding without taking off your entire bra or top. Avoid wearing underwire-style bras or extremely tight bras.  Air dry your nipples for 3-48minutes after each feeding.  Use only cotton bra pads to absorb leaked breast milk. Leaking of breast milk between feedings is normal.  Use lanolin on your nipples after breastfeeding. Lanolin helps to maintain your skin's normal moisture barrier. If you use pure lanolin, you do not need to wash it off before feeding your baby again. Pure lanolin is not toxic to your baby. You may also hand express a few  drops of breast milk and gently massage that milk into your nipples and allow the milk to air dry. In the first few weeks after giving birth, some women experience extremely full breasts (engorgement). Engorgement can make your breasts feel heavy, warm, and tender to the  touch. Engorgement peaks within 3-5 days after you give birth. The following recommendations can help ease engorgement:  Completely empty your breasts while breastfeeding or pumping. You may want to start by applying warm, moist heat (in the shower or with warm water-soaked hand towels) just before feeding or pumping. This increases circulation and helps the milk flow. If your baby does not completely empty your breasts while breastfeeding, pump any extra milk after he or she is finished.  Wear a snug bra (nursing or regular) or tank top for 1-2 days to signal your body to slightly decrease milk production.  Apply ice packs to your breasts, unless this is too uncomfortable for you.  Make sure that your baby is latched on and positioned properly while breastfeeding. If engorgement persists after 48 hours of following these recommendations, contact your health care provider or a Science writer. OVERALL HEALTH CARE RECOMMENDATIONS WHILE BREASTFEEDING  Eat healthy foods. Alternate between meals and snacks, eating 3 of each per day. Because what you eat affects your breast milk, some of the foods may make your baby more irritable than usual. Avoid eating these foods if you are sure that they are negatively affecting your baby.  Drink milk, fruit juice, and water to satisfy your thirst (about 10 glasses a day).  Rest often, relax, and continue to take your prenatal vitamins to prevent fatigue, stress, and anemia.  Continue breast self-awareness checks.  Avoid chewing and smoking tobacco. Chemicals from cigarettes that pass into breast milk and exposure to secondhand smoke may harm your baby.  Avoid alcohol and drug use, including marijuana. Some medicines that may be harmful to your baby can pass through breast milk. It is important to ask your health care provider before taking any medicine, including all over-the-counter and prescription medicine as well as vitamin and herbal  supplements. It is possible to become pregnant while breastfeeding. If birth control is desired, ask your health care provider about options that will be safe for your baby. SEEK MEDICAL CARE IF:  You feel like you want to stop breastfeeding or have become frustrated with breastfeeding.  You have painful breasts or nipples.  Your nipples are cracked or bleeding.  Your breasts are red, tender, or warm.  You have a swollen area on either breast.  You have a fever or chills.  You have nausea or vomiting.  You have drainage other than breast milk from your nipples.  Your breasts do not become full before feedings by the fifth day after you give birth.  You feel sad and depressed.  Your baby is too sleepy to eat well.  Your baby is having trouble sleeping.   Your baby is wetting less than 3 diapers in a 24-hour period.  Your baby has less than 3 stools in a 24-hour period.  Your baby's skin or the white part of his or her eyes becomes yellow.   Your baby is not gaining weight by 8 days of age. SEEK IMMEDIATE MEDICAL CARE IF:  Your baby is overly tired (lethargic) and does not want to wake up and feed.  Your baby develops an unexplained fever.   This information is not intended to replace advice given to you by your health  care provider. Make sure you discuss any questions you have with your health care provider.   Document Released: 03/12/2005 Document Revised: 12/01/2014 Document Reviewed: 09/03/2012 Elsevier Interactive Patient Education 2016 [place] [employer] If you are breastfeeding, there may be times when you cannot feed your baby directly. Returning to work or going on a trip are common examples. Pumping allows you to store breast milk and feed it to your baby later.  You may not get much milk when you first start to pump. Your breasts should start to make more after a few days. If you pump at the times you usually feed your baby, you may be able  to keep making enough milk to feed your baby without also using formula. The more often you pump, the more milk you will produce. WHEN SHOULD I PUMP?   You can begin to pump soon after delivery. However, some experts recommend waiting about 4 weeks before giving your infant a bottle to make sure breastfeeding is going well.  If you plan to return to work, begin pumping a few weeks before. This will help you develop techniques that work best for you. It also lets you build up a supply of breast milk.   When you are with your infant, feed on demand and pump after each feeding.   When you are away from your infant for several hours, pump for about 15 minutes every 2-3 hours. Pump both breasts at the same time if you can.   If your infant has a formula feeding, make sure to pump around the same time.   If you drink any alcohol, wait 2 hours before pumping.  HOW DO I PREPARE TO PUMP? Your let-down reflexis the natural reaction to stimulation that makes your breast milk flow. It is easier to stimulate this reflex when you are relaxed. Find relaxation techniques that work for you. If you have difficulty with your let-down reflex, try these methods:   Smell one of your infant's blankets or an item of clothing.   Look at a picture or video of your infant.   Sit in a quiet, private space.   Massage the breast you plan to pump.   Place soothing warmth on the breast.   Play relaxing music.  WHAT ARE SOME GENERAL BREAST PUMPING TIPS?  Wash your hands before you pump. You do not need to wash your nipples or breasts.  There are three ways to pump.  You can use your hand to massage and compress your breast.  You can use a handheld manual pump.  You can use an electric pump.   Make sure the suction cup (flange) on the breast pump is the right size. Place the flange directly over the nipple. If it is the wrong size or placed the wrong way, it may be painful and cause nipple  damage.   If pumping is uncomfortable, apply a small amount of purified or modified lanolin to your nipple and areola.  If you are using an electric pump, adjust the speed and suction power to be more comfortable.  If pumping is painful or if you are not getting very much milk, you may need a different type of pump. A lactation consultant can help you determine what type of pump to use.   Keep a full water bottle near you at all times. Drinking lots of fluid helps you make more milk.  You can store your milk to use later. Pumped breast milk can  be stored in a sealable, sterile container or plastic bag. Label all stored breast milk with the date you pumped it.  Milk can stay out at room temperature for up to 8 hours.  You can store your milk in the refrigerator for up to 8 days.  You can store your milk in the freezer for 3 months. Thaw frozen milk using warm water. Do not put it in the microwave.  Do not smoke. Smoking can lower your milk supply and harm your infant. If you need help quitting, ask your health care provider to recommend a program.  [place] A LACTATION CONSULTANT?  You are having trouble pumping.  You are concerned that you are not making enough milk.  You have nipple pain, soreness, or redness.  You want to use birth control. Birth control pills may lower your milk supply. Talk to your health care provider about your options.   This information is not intended to replace advice given to you by your health care provider. Make sure you discuss any questions you have with your health care provider.   Document Released: 08/30/2009 Document Revised: 03/17/2013 Document Reviewed: 01/02/2013 Elsevier Interactive Patient Education [employer].

## 2015-03-26 NOTE — Discharge Summary (Signed)
Obstetric Discharge Summary Reason for Admission: preterm labor Prenatal Procedures: none Intrapartum Procedures: spontaneous vaginal delivery Postpartum Procedures: none Complications-Operative and Postpartum: perineal laceration  L&D Delivery Note by Gwen Pounds, CNM at 03/24/2015 9:54 AM    Author: Gwen Pounds, CNM Service: Obstetrics Author Type: Certified Nurse Midwife   Filed: 03/24/2015 9:55 AM Note Time: 03/24/2015 9:54 AM Status: Cosign Needed   Editor: Gwen Pounds, CNM (Certified Nurse Midwife) Cosign Required: Yes   Expand All Collapse All   Delivery Note At 9:23 AM a viable and healthy female was delivered via Vaginal, Spontaneous Delivery (Presentation: Right Occiput Anterior). APGAR: 7, 9; weight pending.  Placenta status: Intact, Spontaneous. Cord: 3 vessels with the following complications: None. Cord pH: n/a  Anesthesia: Epidural  Episiotomy: None Lacerations: 2nd degree;Perineal; repaired by Dr. Tawni Levy Repair: 3.0 Monocryl Est. Blood Loss (mL): 350  Mom to postpartum. Baby to Couplet care / Skin to Skin.  Gwen Pounds 03/24/2015, 9:54 AM        Hospital Course:  Active Problems:   Active preterm labor   Emily Chan is a 34 y.o. [id number] s/p SVD.  She has postpartum course that was uncomplicated including no problems with ambulating, PO intake, urination, pain, or bleeding. The pt feels ready to go home and  will be discharged with outpatient follow-up.   Today: No acute events overnight.  Pt denies problems with ambulating, voiding or po intake.  She denies nausea or vomiting.  Pain is moderately controlled.  She has had flatus. She has not had bowel movement.  Lochia Small.  Plan for birth control is bilateral tubal ligation, possible nexplanon.  Method of Feeding: breast  Physical Exam:  BP 113/65 mmHg  Pulse 79  Temp(Src) 97.9 F (36.6 C) (Oral)  Resp 16  Ht 5\' 4"  (1.626 m)  Wt 81.647 kg (180 lb)  BMI 30.88  kg/m2  SpO2 99%  LMP 07/17/2014  Breastfeeding? Unknown General: alert and cooperative Lochia: appropriate Uterine Fundus: firm Incision:  DVT Evaluation: No evidence of DVT seen on physical exam.  H/H: Lab Results  Component Value Date/Time   HGB 13.5 03/24/2015 02:34 AM   HCT 37.4 03/24/2015 02:34 AM   HCT 37.7 02/03/2015 08:54 AM    Discharge Diagnoses: Preterm Delivery  Discharge Information: Date: 03/26/2015 Activity: pelvic rest Diet: routine  Medications: Ibuprofen and Percocet Breast feeding:  Yes Condition: stable Instructions: refer to handout Discharge to: home      Medication List    TAKE these medications        acetaminophen 325 MG tablet  Commonly known as:  TYLENOL  Take 650 mg by mouth every 6 (six) hours as needed.     benzocaine-Menthol 20-0.5 % Aero  Commonly known as:  DERMOPLAST  Apply 1 application topically as needed for irritation (perineal discomfort).     ibuprofen 600 MG tablet  Commonly known as:  ADVIL,MOTRIN  Take 1 tablet (600 mg total) by mouth every 6 (six) hours.     lanolin Oint  Apply 1 application topically as needed (for breast care).     oxyCODONE-acetaminophen 5-325 MG tablet  Commonly known as:  PERCOCET/ROXICET  Take 1 tablet by mouth every 4 (four) hours as needed (for pain scale 4-7).     prenatal vitamin w/FE, FA 27-1 MG Tabs tablet  Take 1 tablet by mouth daily at 12 noon.           Follow-up Information    Follow up with  Jonnie Kind, MD In 6 weeks.   Specialties:  Obstetrics and Gynecology, Radiology   Contact information:   [place] [address] [phone]        Emily Chan, [name] 03/26/2015,7:34 AM

## 2015-03-27 ENCOUNTER — Ambulatory Visit: Payer: Self-pay

## 2015-03-27 NOTE — Lactation Note (Signed)
This note was copied from the chart of Emily Chan. Lactation Consultation Note  Patient Name: Emily Greysi Pangelinan [id number] Date: 03/27/2015 Reason for consult: Follow-up assessment;NICU baby  NICU baby 67 hours old. Mom reports that baby has been to the breast once before, but baby would not latch. Assisted mom with positioning and assisted with latching baby to left breast in cross-cradle position. Mom has easily expressible and compressible breasts, but baby not willing to latch. Fitted mom with a #20 NS and after several attempts, baby willing to latch deeply. However, baby only gently suckled and chewed at the NS, off-and-on. Mom reports that she is more comfortable with blanket rolls placed under baby to help support his body and head at breast. Baby not able to transfer any breast milk. Discussed with mom that baby's elevated bilirubin is contributing to his sleepiness at breast. Also discussed with mom that this was an excellent attempt at the breast and that it does help with mom's milk supply. Enc mom to keep putting baby to breast as able.  Maternal Data    Feeding Feeding Type: Breast Fed Nipple Type: Slow - flow Length of feed: 10 min  LATCH Score/Interventions Latch: Repeated attempts needed to sustain latch, nipple held in mouth throughout feeding, stimulation needed to elicit sucking reflex. Intervention(s): Skin to skin;Waking techniques Intervention(s): Adjust position;Assist with latch  Audible Swallowing: None Intervention(s): Skin to skin;Hand expression  Type of Nipple: Everted at rest and after stimulation  Comfort (Breast/Nipple): Soft / non-tender     Hold (Positioning): Assistance needed to correctly position infant at breast and maintain latch. Intervention(s): Breastfeeding basics reviewed;Support Pillows;Position options;Skin to skin  LATCH Score: 6  Lactation Tools Discussed/Used Tools: Nipple Shields Nipple shield size: 20   Consult  Status Consult Status: PRN    Inocente Salles 03/27/2015, 10:26 AM

## 2015-03-27 NOTE — Lactation Note (Signed)
This note was copied from the chart of Emily Sherra Scarpone. Lactation Consultation Note  Patient Name: Emily Chan [id number] Date: 03/27/2015 Reason for consult: Follow-up assessment;NICU baby NICU baby 45 hours old. Assisted with latching baby in cross-cradle position, but baby sleepy and not wanting to latch. Repositioned baby in football position to right breast using #20 NS and baby latched deeply. Demonstrated how to flange baby's lower lip and baby suckled well at breast with a few swallows noted--as long as mom continued to stimulate baby by rubbing the side of his head. Milk was also present in the NS. Discussed with mom that baby should continue to improve at the breast with more opportunities, but not to be discouraged if he doesn't nurse well at times--this is normal especially with elevated bilirubin.   Maternal Data    Feeding Feeding Type: Breast Fed Nipple Type: Slow - flow Length of feed: 20 min  LATCH Score/Interventions Latch: Repeated attempts needed to sustain latch, nipple held in mouth throughout feeding, stimulation needed to elicit sucking reflex. Intervention(s): Skin to skin;Waking techniques Intervention(s): Adjust position;Assist with latch  Audible Swallowing: A few with stimulation Intervention(s): Skin to skin;Hand expression  Type of Nipple: Everted at rest and after stimulation  Comfort (Breast/Nipple): Soft / non-tender     Hold (Positioning): Assistance needed to correctly position infant at breast and maintain latch. Intervention(s): Breastfeeding basics reviewed;Support Pillows;Position options;Skin to skin  LATCH Score: 7  Lactation Tools Discussed/Used Tools: Nipple Shields Nipple shield size: 20   Consult Status Consult Status: PRN    Inocente Salles 03/27/2015, 1:39 PM

## 2015-03-29 ENCOUNTER — Ambulatory Visit: Payer: Self-pay

## 2015-03-29 NOTE — Lactation Note (Signed)
This note was copied from the chart of Emily Vaanya Shambaugh. Lactation Consultation Note  Met with mom in the NICU.  Baby is 45 days old and mom is pumping 30-45 mls every 2 hours.  She states she attempted baby at breast 2 days ago but he was unable to transfer milk.  Reminded of late preterm feeding norms and feedings at breast will improve as baby matures.  Encouraged to have RN call when she would like latch assist.  Recommended skin to skin as much as possible.  Patient Name: Emily Chan [id number] Date: 03/29/2015     Maternal Data    Feeding Feeding Type: Breast Milk with Formula added Nipple Type: Slow - flow Length of feed: 30 min  LATCH Score/Interventions                      Lactation Tools Discussed/Used     Consult Status      Ave Filter 03/29/2015, 12:03 PM

## 2015-03-31 ENCOUNTER — Encounter: Payer: PRIVATE HEALTH INSURANCE | Admitting: Advanced Practice Midwife

## 2015-04-14 ENCOUNTER — Telehealth: Payer: Self-pay | Admitting: Obstetrics & Gynecology

## 2015-04-14 NOTE — Telephone Encounter (Signed)
Pt states delivered about 4 weeks ago, thinks she has yeast infection usually uses OTC Monistat. Is this med safe while breastfeeding? Per Tia Alert, CNM Yes, Monistat is the preferred med for yeast while BF.

## 2015-05-03 ENCOUNTER — Encounter: Payer: Self-pay | Admitting: Women's Health

## 2015-05-03 ENCOUNTER — Ambulatory Visit (INDEPENDENT_AMBULATORY_CARE_PROVIDER_SITE_OTHER): Payer: PRIVATE HEALTH INSURANCE | Admitting: Adult Health

## 2015-05-03 DIAGNOSIS — Z30011 Encounter for initial prescription of contraceptive pills: Secondary | ICD-10-CM

## 2015-05-03 DIAGNOSIS — Z3202 Encounter for pregnancy test, result negative: Secondary | ICD-10-CM | POA: Diagnosis not present

## 2015-05-03 DIAGNOSIS — K649 Unspecified hemorrhoids: Secondary | ICD-10-CM

## 2015-05-03 DIAGNOSIS — Z309 Encounter for contraceptive management, unspecified: Secondary | ICD-10-CM | POA: Insufficient documentation

## 2015-05-03 HISTORY — DX: Unspecified hemorrhoids: K64.9

## 2015-05-03 HISTORY — DX: Encounter for contraceptive management, unspecified: Z30.9

## 2015-05-03 LAB — POCT URINE PREGNANCY: PREG TEST UR: NEGATIVE

## 2015-05-03 MED ORDER — NORETHINDRONE 0.35 MG PO TABS: 1.0000 | ORAL_TABLET | Freq: Every day | ORAL | Status: DC

## 2015-05-03 NOTE — Progress Notes (Signed)
Patient ID: Emily Chan, female   DOB: 1980-10-26, 35 y.o.   MRN: [id number] Markala is a 35 year old white female, married, G2P1, in for postpartum visit.  Delivery Date:03/24/15  Method of Delivery: Vaginal delivery, baby boy Aleene Davidson Had second degree laceration with stitches   Sexual Activity since delivery: No   Method of Feeding: Breast and bottle  Number of weeks bleeding post delivery: 2-3 weeks  Review of Systems: Patient denies any headaches, hearing loss, fatigue, blurred vision, shortness of breath, chest pain, abdominal pain, problems with bowel movements, urination, or intercourse(none yet). No joint pain or mood swings.Has Hemorrhoids since delivery.  Reviewed past medical,surgical, social and family history. Reviewed medications and allergies.   Depression Score: 3 BP 118/70 mmHg  Pulse 64  Wt 170 lb (77.111 kg)  Breastfeeding? Yes UPT negative. Skin warm and dry. Lungs: clear to ausculation bilaterally. Cardiovascular: regular rate and rhythm. Pelvic Exam:   External genitalia is normal in appearance, no lesions.  The vagina has good color, moisture and rugae, no lesions.Urethra has no masses or tenderness noted. The cervix is bulbous.  Uterus is felt to be normal size, shape, and contour, well involuted.  No adnexal masses or tenderness noted.Bladder is non tender, no masses felt.+ external hemorhoids. Discussed OCs will try POPs since breastfeeding,but thinking about nexplanon, handout given Has returned to work and has been walking, can begin more exercises, no heavy lifting or squats yet.  Impression:  Status post delivery, post partum check, depression screening, contraceptive management.Hemorrhoids   Plan:   Rx micronor disp 1 pack take 1 daily, start today with 11 refills Use condoms Return in 3 months for pap and physical Use preparation H and witch hazel and try to push in at night, if not better call

## 2015-05-03 NOTE — Patient Instructions (Signed)
Start OCs, micronor today Use condoms  Pap and physical in 3 months Use preparation H

## 2015-05-09 ENCOUNTER — Telehealth: Payer: Self-pay | Admitting: Adult Health

## 2015-05-09 NOTE — Telephone Encounter (Signed)
Spoke with pt. Pt started Micronor last week. She is having heavy bleeding. I advised it's not uncommon to have heavy bleeding when you start a new birth plus this is her 1st period since delivery. I advised to continue taking pills and give it another week to see if bleeding got better. Advised to call us sooner if bleeding got worse. Pt voiced understanding. [place]

## 2015-07-28 ENCOUNTER — Other Ambulatory Visit: Payer: PRIVATE HEALTH INSURANCE | Admitting: Adult Health

## 2015-08-15 ENCOUNTER — Other Ambulatory Visit: Payer: PRIVATE HEALTH INSURANCE | Admitting: Adult Health

## 2015-08-18 ENCOUNTER — Encounter: Payer: Self-pay | Admitting: Adult Health

## 2015-08-18 ENCOUNTER — Ambulatory Visit (INDEPENDENT_AMBULATORY_CARE_PROVIDER_SITE_OTHER): Payer: PRIVATE HEALTH INSURANCE | Admitting: Adult Health

## 2015-08-18 ENCOUNTER — Other Ambulatory Visit (HOSPITAL_COMMUNITY)
Admission: RE | Admit: 2015-08-18 | Discharge: 2015-08-18 | Disposition: A | Discharge status: Home / Self Care | Payer: PRIVATE HEALTH INSURANCE | Source: Ambulatory Visit | Attending: Adult Health | Admitting: Adult Health

## 2015-08-18 VITALS — BP 112/66 | HR 72 | Ht 63.25 in | Wt 154.0 lb

## 2015-08-18 DIAGNOSIS — Z1151 Encounter for screening for human papillomavirus (HPV): Secondary | ICD-10-CM | POA: Diagnosis present

## 2015-08-18 DIAGNOSIS — Z01419 Encounter for gynecological examination (general) (routine) without abnormal findings: Secondary | ICD-10-CM

## 2015-08-18 DIAGNOSIS — Z01411 Encounter for gynecological examination (general) (routine) with abnormal findings: Secondary | ICD-10-CM | POA: Diagnosis present

## 2015-08-18 DIAGNOSIS — R200 Anesthesia of skin: Secondary | ICD-10-CM | POA: Insufficient documentation

## 2015-08-18 DIAGNOSIS — Z8742 Personal history of other diseases of the female genital tract: Secondary | ICD-10-CM

## 2015-08-18 HISTORY — DX: Anesthesia of skin: R20.0

## 2015-08-18 HISTORY — DX: Personal history of other diseases of the female genital tract: Z87.42

## 2015-08-18 NOTE — Progress Notes (Signed)
Patient ID: Emily Chan, female   DOB: [birth year], 35 y.o.   MRN: [id number] History of Present Illness:  Emily Chan is a 35 year old white female, married, G2P1 in for well woman gyn exam and pap, her last pap was LSIL 06/25/14.She is still breastfeeding and in on micronor.She is complaining of numbness and tingling in both legs on and off since epidural, notes esp at night.Her son is 62 months old.   Current Medications, Allergies, Past Medical History, Past Surgical History, Family History and Social History were reviewed in [employer] record.     Review of Systems: Patient denies any headaches, hearing loss, fatigue, blurred vision, shortness of breath, chest pain, abdominal pain, problems with bowel movements, urination, or intercourse. No joint pain or mood swings.See HPI for positives.    Physical Exam:BP 112/66 mmHg  Pulse 72  Ht 5' 3.25" (1.607 m)  Wt 154 lb (69.854 kg)  BMI 27.05 kg/m2  LMP 08/10/2015  Breastfeeding? Yes General:  Well developed, well nourished, no acute distress Skin:  Warm and dry Neck:  Midline trachea, normal thyroid, good ROM, no lymphadenopathy Lungs; Clear to auscultation bilaterally Breast:  No dominant palpable mass, retraction, or nipple discharge Cardiovascular: Regular rate and rhythm Abdomen:  Soft, non tender, no hepatosplenomegaly Pelvic:  External genitalia is normal in appearance, no lesions.  The vagina is normal in appearance. Urethra has no lesions or masses. The cervix is bulbous, everted at os and friable with EC brush, pap with HPV performed.  Uterus is felt to be normal size, shape, and contour.  No adnexal masses or tenderness noted.Bladder is non tender, no masses felt. Extremities/musculoskeletal:  No swelling or varicosities noted, no clubbing or cyanosis Psych:  No mood changes, alert and cooperative,seems happy   Impression: Well woman gyn exam and pap History of abnormal pap Numbness both legs      Plan: Continue micronor, call when stops breastfeeding and will call in [facility] Physical in 1 year, pap in 3 if normal Watch legs for now, should get better

## 2015-08-18 NOTE — Patient Instructions (Signed)
Physical in 1 year, pap in 3 if normal Call when stops breastfeeding for COCs

## 2015-08-23 LAB — CYTOLOGY - PAP

## 2015-08-24 ENCOUNTER — Telehealth: Payer: Self-pay | Admitting: Adult Health

## 2015-08-24 DIAGNOSIS — R87612 Low grade squamous intraepithelial lesion on cytologic smear of cervix (LGSIL): Secondary | ICD-10-CM

## 2015-08-24 NOTE — Telephone Encounter (Signed)
Pt aware pap abnormal LSIL +HPV to get colpo with Dr Elonda Husky

## 2015-09-05 ENCOUNTER — Encounter: Payer: PRIVATE HEALTH INSURANCE | Admitting: Obstetrics & Gynecology

## 2015-09-08 ENCOUNTER — Ambulatory Visit (INDEPENDENT_AMBULATORY_CARE_PROVIDER_SITE_OTHER): Payer: PRIVATE HEALTH INSURANCE | Admitting: Obstetrics & Gynecology

## 2015-09-08 ENCOUNTER — Other Ambulatory Visit: Payer: Self-pay | Admitting: Obstetrics & Gynecology

## 2015-09-08 ENCOUNTER — Encounter: Payer: Self-pay | Admitting: Obstetrics & Gynecology

## 2015-09-08 VITALS — BP 100/60 | HR 72 | Ht 63.0 in | Wt 164.0 lb

## 2015-09-08 DIAGNOSIS — R87612 Low grade squamous intraepithelial lesion on cytologic smear of cervix (LGSIL): Secondary | ICD-10-CM | POA: Diagnosis not present

## 2015-09-08 MED ORDER — DESOGESTREL-ETHINYL ESTRADIOL 0.15-30 MG-MCG PO TABS: 1.0000 | ORAL_TABLET | Freq: Every day | ORAL | Status: DC

## 2015-09-08 NOTE — Addendum Note (Signed)
Addended by: Florian Buff on: 09/08/2015 03:39 PM   Modules accepted: Orders

## 2015-09-08 NOTE — Progress Notes (Signed)
Patient ID: Emily Chan, female   DOB: 01/06/1981, 35 y.o.   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  Prior cervical/vaginal disease: normal exam without visible pathology. Prior cervical treatment: no treatment.  Smoker:  No. New sexual partner:  No.  : time frame:    History of abnormal Pap: yes  Procedure Details  The risks and benefits of the procedure and Written informed consent obtained.  Speculum placed in vagina and excellent visualization of cervix achieved, cervix swabbed x 3 with acetic acid solution.  Findings: Cervix: very complex cervix, 3 dimensional with lots of surface area secondary to columnar epithelium, light AWE changes, no punctation no mosaicism or abnormal vessels  ; cervical biopsies taken at 6 o'clock. Vaginal inspection: normal without visible lesions. Vulvar colposcopy: vulvar colposcopy not performed.  Specimens: cervical biopsy  Complications: none.  Plan: Specimens labelled and sent to Pathology. Will base further treatment on Pathology findings. Return to discuss Pathology results in 1 week.

## 2015-09-08 NOTE — Addendum Note (Signed)
Addended by: Diona Fanti A on: 09/08/2015 03:40 PM   Modules accepted: Orders

## 2015-09-15 ENCOUNTER — Ambulatory Visit (INDEPENDENT_AMBULATORY_CARE_PROVIDER_SITE_OTHER): Payer: PRIVATE HEALTH INSURANCE | Admitting: Obstetrics & Gynecology

## 2015-09-15 ENCOUNTER — Encounter: Payer: Self-pay | Admitting: Obstetrics & Gynecology

## 2015-09-15 VITALS — BP 108/80 | HR 64 | Ht 63.0 in | Wt 164.0 lb

## 2015-09-15 DIAGNOSIS — IMO0002 Reserved for concepts with insufficient information to code with codable children: Secondary | ICD-10-CM

## 2015-09-15 DIAGNOSIS — R896 Abnormal cytological findings in specimens from other organs, systems and tissues: Secondary | ICD-10-CM | POA: Diagnosis not present

## 2015-09-15 MED ORDER — HPV 9-VALENT RECOMB VACCINE IM SUSP: 1.0000 | Freq: Once | INTRAMUSCULAR | Status: DC

## 2015-09-15 NOTE — Progress Notes (Signed)
Patient ID: Dalina Kreiter, female   DOB: Dec 10, 1980, 35 y.o.   MRN: [id number] Follow up appointment for results  Chief Complaint  Patient presents with  . Follow-up    Colposcopy 09/08/15    Blood pressure 108/80, pulse 64, height 5\' 3"  (1.6 m), weight 164 lb (74.39 kg), last menstrual period 09/10/2015, currently breastfeeding.  Cervical biopsy reveals LSIL  Will repeat eval in 1 year Meds ordered this encounter  Medications  . HPV 9-Valent Recomb Vaccine (GARDASIL 9) SUSP    Sig: Inject 1 vial into the muscle once.    Dispense:  1 vial    Refill:  0     MEDS ordered this encounter:   Orders for this encounter: No orders of the defined types were placed in this encounter.    Plan: Cytology 1 year gardasil 9  Follow Up:     Face to face time:  10 minutes  Greater than 50% of the visit time was spent in counseling and coordination of care with the patient.  The summary and outline of the counseling and care coordination is summarized in the note above.   All questions were answered.  Past Medical History  Diagnosis Date  . Abnormal Pap smear   . Hx: UTI (urinary tract infection)   . Contraception management 06/16/2012  . Vaginal irritation 12/25/2012  . Yeast infection 12/25/2012  . Vaginal Pap smear, abnormal   . Pregnant 01/28/2014  . Miscarriage   . Abdominal cramping affecting pregnancy 08/24/2014  . Bleeding in early pregnancy 09/02/2014    Pelvic rest  . Contraceptive management 05/03/2015  . Hemorrhoids 05/03/2015  . History of abnormal cervical Pap smear 08/18/2015  . Numbness in both legs 08/18/2015    Has numbness and tingling in both legs since epidural, on and off esp at night    Past Surgical History  Procedure Laterality Date  . Appendectomy    . Wisdom tooth extraction      OB History    Gravida Para Term Preterm AB TAB SAB Ectopic Multiple Living   2 1  1 1     0 1      Obstetric Comments   14.3wks missed ab, CRL measures 8wks       Allergies  Allergen Reactions  . Sulfa Antibiotics Rash    Social History   Social History  . Marital Status: Married    Spouse Name: N/A  . Number of Children: N/A  . Years of Education: N/A   Social History Main Topics  . Smoking status: Never Smoker   . Smokeless tobacco: Never Used  . Alcohol Use: No     Comment: occ.  . Drug Use: No  . Sexual Activity: Yes    Birth Control/ Protection: Pill   Other Topics Concern  . None   Social History Narrative    Family History  Problem Relation Age of Onset  . Cancer Paternal Aunt     melenoma  . Diabetes Maternal Grandmother   . Hypertension Maternal Grandmother   . Cancer Maternal Grandfather     Brain and Lung  . Diabetes Maternal Grandfather   . Cancer Paternal Grandmother     breast

## 2015-09-19 ENCOUNTER — Telehealth: Payer: Self-pay | Admitting: Obstetrics & Gynecology

## 2015-09-20 NOTE — Telephone Encounter (Signed)
Pt states may go ahead and pay out of pocket for HPV vaccine.

## 2015-09-20 NOTE — Telephone Encounter (Signed)
Would be nice to get, need is not the right term, would be beneficial statistically but understand if it is too expensive

## 2015-10-25 ENCOUNTER — Ambulatory Visit (INDEPENDENT_AMBULATORY_CARE_PROVIDER_SITE_OTHER): Payer: PRIVATE HEALTH INSURANCE | Admitting: Family Medicine

## 2015-10-25 VITALS — BP 122/70 | HR 61 | Temp 98.9°F | Resp 16 | Wt 161.2 lb

## 2015-10-25 DIAGNOSIS — R51 Headache: Secondary | ICD-10-CM | POA: Diagnosis not present

## 2015-10-25 DIAGNOSIS — R519 Headache, unspecified: Secondary | ICD-10-CM

## 2015-10-25 MED ORDER — NAPROXEN 500 MG PO TABS: 500.0000 mg | ORAL_TABLET | Freq: Two times a day (BID) | ORAL | 0 refills | Status: DC

## 2015-10-25 MED ORDER — TIZANIDINE HCL 4 MG PO TABS: 4.0000 mg | ORAL_TABLET | Freq: Three times a day (TID) | ORAL | 0 refills | Status: DC

## 2015-10-25 NOTE — Patient Instructions (Signed)
Follow-up if headache worsens or does not resolve. Take muscle relaxant Zanaflex as needed. May cause drowsiness. Take naproxen with meals to avoid upset stomach twice daily.     General Headache Without Cause A headache is pain or discomfort felt around the head or neck area. There are many causes and types of headaches. In some cases, the cause may not be found.  HOME CARE  Managing Pain  Take over-the-counter and prescription medicines only as told by your doctor.  Lie down in a dark, quiet room when you have a headache.  If directed, apply ice to the head and neck area:  Put ice in a plastic bag.  Place a towel between your skin and the bag.  Leave the ice on for 20 minutes, 2-3 times per day.  Use a heating pad or hot shower to apply heat to the head and neck area as told by your doctor.  Keep lights dim if bright lights bother you or make your headaches worse. Eating and Drinking  Eat meals on a regular schedule.  Lessen how much alcohol you drink.  Lessen how much caffeine you drink, or stop drinking caffeine. General Instructions  Keep all follow-up visits as told by your doctor. This is important.  Keep a journal to find out if certain things bring on headaches. For example, write down:  What you eat and drink.  How much sleep you get.  Any change to your diet or medicines.  Relax by getting a massage or doing other relaxing activities.  Lessen stress.  Sit up straight. Do not tighten (tense) your muscles.  Do not use tobacco products. This includes cigarettes, chewing tobacco, or e-cigarettes. If you need help quitting, ask your doctor.  Exercise regularly as told by your doctor.  Get enough sleep. This often means 7-9 hours of sleep. GET HELP IF:  Your symptoms are not helped by medicine.  You have a headache that feels different than the other headaches.  You feel sick to your stomach (nauseous) or you throw up (vomit).  You have a  fever. GET HELP RIGHT AWAY IF:   Your headache becomes really bad.  You keep throwing up.  You have a stiff neck.  You have trouble seeing.  You have trouble speaking.  You have pain in the eye or ear.  Your muscles are weak or you lose muscle control.  You lose your balance or have trouble walking.  You feel like you will pass out (faint) or you pass out.  You have confusion.   This information is not intended to replace advice given to you by your health care provider. Make sure you discuss any questions you have with your health care provider.   Document Released: 12/20/2007 Document Revised: 12/01/2014 Document Reviewed: 07/05/2014 Elsevier Interactive Patient Education 2016 [employer]  Cervical Sprain A cervical sprain is when the tissues (ligaments) that hold the neck bones in place stretch or tear. HOME CARE   Put ice on the injured area.  Put ice in a plastic bag.  Place a towel between your skin and the bag.  Leave the ice on for 15-20 minutes, 3-4 times a day.  You may have been given a collar to wear. This collar keeps your neck from moving while you heal.  Do not take the collar off unless told by your doctor.  If you have long hair, keep it outside of the collar.  Ask your doctor before changing the position of your collar.  You may need to change its position over time to make it more comfortable.  If you are allowed to take off the collar for cleaning or bathing, follow your doctor's instructions on how to do it safely.  Keep your collar clean by wiping it with mild soap and water. Dry it completely. If the collar has removable pads, remove them every 1-2 days to hand wash them with soap and water. Allow them to air dry. They should be dry before you wear them in the collar.  Do not drive while wearing the collar.  Only take medicine as told by your doctor.  Keep all doctor visits as told.  Keep all physical therapy visits as told.  Adjust  your work station so that you have good posture while you work.  Avoid positions and activities that make your problems worse.  Warm up and stretch before being active. GET HELP IF:  Your pain is not controlled with medicine.  You cannot take less pain medicine over time as planned.  Your activity level does not improve as expected. GET HELP RIGHT AWAY IF:   You are bleeding.  Your stomach is upset.  You have an allergic reaction to your medicine.  You develop new problems that you cannot explain.  You lose feeling (become numb) or you cannot move any part of your body (paralysis).  You have tingling or weakness in any part of your body.  Your symptoms get worse. Symptoms include:  Pain, soreness, stiffness, puffiness (swelling), or a burning feeling in your neck.  Pain when your neck is touched.  Shoulder or upper back pain.  Limited ability to move your neck.  Headache.  Dizziness.  Your hands or arms feel week, lose feeling, or tingle.  Muscle spasms.  Difficulty swallowing or chewing. MAKE SURE YOU:   Understand these instructions.  Will watch your condition.  Will get help right away if you are not doing well or get worse.   This information is not intended to replace advice given to you by your health care provider. Make sure you discuss any questions you have with your health care provider.   Document Released: 08/29/2007 Document Revised: 11/12/2012 Document Reviewed: 09/17/2012 Elsevier Interactive Patient Education [employer].

## 2015-10-25 NOTE — Progress Notes (Signed)
   Patient ID: Emily Chan, female    DOB: 1981-01-06, 35 y.o.   MRN: [id number]  PCP: Alonza Bogus, MD  Chief Complaint  Patient presents with  . Headache    Subjective:   HPI Presents for evaluation of headache times 1 week. Pain is 2/10 right now. Reports headache as constantly present.  Late at night headache increases intensity to 7-8/10. Tylenol or ibuprofen did not relieve or reduce pain. Stiffness of neck after working out at [employer] on Saturday. Neck stiffness and achiness 1/10 is still present. She feels the neck stiffness and headache are related.  Denies injury. She works out regularly and has not changed workout routine. No visual disturbances. Reports fatigue. No dizziness.  Typically does not have a history of headaches except during pregnancy LMPD 10/13/15. Takes birth control pills, and has been on pills for 3 months. Did not experience headache when first starting the pills. Do not feel pills are related to headache.  Review of Systems  Constitutional: Negative.   Eyes: Negative.  Negative for visual disturbance.  Respiratory: Negative.   Cardiovascular: Negative.   Musculoskeletal: Positive for neck pain and neck stiffness.       See HPI  Skin: Negative.   Neurological: Positive for headaches. Negative for dizziness, tremors, speech difficulty and weakness.       See HPI       Patient Active Problem List   Diagnosis Date Noted  . History of abnormal cervical Pap smear 08/18/2015  . Numbness in both legs 08/18/2015  . Contraceptive management 05/03/2015  . Hemorrhoids 05/03/2015  . NSVD (normal spontaneous vaginal delivery) 03/26/2015  . Obstetrical laceration, second degree 03/26/2015  . Active preterm labor 03/24/2015  . 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 Mild dysplasia of cervix 07/16/2013  . Abnormal Pap smear of cervix 06/15/2012     Prior to Admission medications   Medication Sig Start Date End Date Taking? Authorizing  Provider  desogestrel-ethinyl estradiol (APRI,EMOQUETTE,SOLIA) 0.15-30 MG-MCG tablet Take 1 tablet by mouth daily. 09/08/15  Yes Florian Buff, MD     Allergies  Allergen Reactions  . Sulfa Antibiotics Rash       Objective:  Physical Exam  Constitutional: She is oriented to person, place, and time. She appears well-developed and well-nourished.  Eyes: Conjunctivae and EOM are normal. Pupils are equal, round, and reactive to light.  Neck: Normal range of motion. Neck supple.  Cardiovascular: Normal rate, regular rhythm, normal heart sounds and intact distal pulses.   Pulmonary/Chest: Effort normal and breath sounds normal.  Musculoskeletal: Normal range of motion. She exhibits tenderness. She exhibits no edema.  Neck tenderness with passive ROM  Neurological: She is alert and oriented to person, place, and time.  Negative for nystagmus.    Skin: Skin is warm and dry.  Psychiatric: She has a normal mood and affect. Her behavior is normal. Judgment and thought content normal.           Assessment & Plan:  .1. Nonintractable headache, unspecified chronicity pattern, unspecified headache type Likely, tension type headache.  Take Tizanidine 4 mg, 3 times daily until headache is aborted. Take naproxen 500 mg twice daily with meals until headache subsides.  Follow-up as needed or if headache does not resolve.  Carroll Sage. Kenton Kingfisher, MSN, FNP-C [facility]

## 2015-10-26 ENCOUNTER — Telehealth: Payer: Self-pay | Admitting: *Deleted

## 2015-10-26 MED ORDER — NORETHIN-ETH ESTRAD-FE BIPHAS 1 MG-10 MCG / 10 MCG PO TABS: 1.0000 | ORAL_TABLET | Freq: Every day | ORAL | 4 refills | Status: DC

## 2015-10-26 NOTE — Telephone Encounter (Signed)
Will give 1 pack lo loestrin to start in am, has had headaches with last OC but did not with micronor, will give low dose OC   Sample of lo loestrin were given to the patient, quantity 1pack, Lot Number [id number] A exp 3/19

## 2015-10-26 NOTE — Telephone Encounter (Signed)
Spoke with pt. Pt is having bad headaches. She went to Urgent Care yesterday and Urgent Care mentioned it may be from the birth control. She has been on the new birth control x 1 month. Headaches are everyday and a nagging pain. Worse at night. She was given Naproxen and it's not helping the headaches. I spoke with JAG and she advised it could be to much estrogen in current pill. Will switch to Lo Loestrin and see if that helps. Pt's mom to come by office and pick up sample pack and coupon. [place]

## 2015-11-21 ENCOUNTER — Telehealth: Payer: Self-pay | Admitting: Adult Health

## 2015-11-21 MED ORDER — NORETHINDRONE 0.35 MG PO TABS: 1.0000 | ORAL_TABLET | Freq: Every day | ORAL | 11 refills | Status: DC

## 2015-11-21 NOTE — Telephone Encounter (Signed)
Spoke with pt. Pt has been on Lo Loestrin since the beginning of August. She is having headaches everyday. She thinks it's the birth control. Please advise. Thanks!! [place]

## 2015-11-21 NOTE — Telephone Encounter (Signed)
Pt complains of headaches with lo loestrin, but did not have with POP, when breast feeding, will Rx micronor, use condoms for at least 1 pack

## 2016-01-09 ENCOUNTER — Ambulatory Visit (INDEPENDENT_AMBULATORY_CARE_PROVIDER_SITE_OTHER): Payer: PRIVATE HEALTH INSURANCE | Admitting: Physician Assistant

## 2016-01-09 VITALS — BP 120/84 | HR 55 | Temp 98.9°F | Resp 17 | Ht 63.0 in | Wt 151.0 lb

## 2016-01-09 DIAGNOSIS — H578 Other specified disorders of eye and adnexa: Secondary | ICD-10-CM | POA: Diagnosis not present

## 2016-01-09 DIAGNOSIS — J309 Allergic rhinitis, unspecified: Secondary | ICD-10-CM | POA: Diagnosis not present

## 2016-01-09 DIAGNOSIS — H5789 Other specified disorders of eye and adnexa: Secondary | ICD-10-CM

## 2016-01-09 MED ORDER — KETOROLAC TROMETHAMINE 0.5 % OP SOLN: 1.0000 [drp] | Freq: Four times a day (QID) | OPHTHALMIC | 0 refills | Status: DC

## 2016-01-09 MED ORDER — CETIRIZINE HCL 10 MG PO TABS: 10.0000 mg | ORAL_TABLET | Freq: Every day | ORAL | 6 refills | Status: DC

## 2016-01-09 NOTE — Patient Instructions (Signed)
     IF you received an x-ray today, you will receive an invoice from [facility]. Please contact [facility] at [phone] with questions or concerns regarding your invoice.   IF you received labwork today, you will receive an invoice from [facility]. Please contact [facility] at [phone] with questions or concerns regarding your invoice.   Our billing staff will not be able to assist you with questions regarding bills from these companies.  You will be contacted with the lab results as soon as they are available. The fastest way to get your results is to activate your My Chart account. Instructions are located on the last page of this paperwork. If you have not heard from us regarding the results in 2 weeks, please contact this office.      

## 2016-01-09 NOTE — Progress Notes (Signed)
Emily Chan  MRN: [id number] DOB: 03-31-1980  Subjective:  Emily Chan is a 35 y.o. female seen in office today for a chief complaint of left eye irritation x 1 day. Woke up this morning and had minimal "gook" in it but she wiped it away it did not reoccur. Does note that she slept in her eye makeup last night and she never does this. Has associated itching. Denies photophobia, pain with eye movement, foreign body sensation, visual disturbance, foreign body sensation, discharge, and right eye involvement. She has had a history of bacterial conjunvititis about 15 years ago and notes that this is not as bad as it was then. She is not a contact lens wearer.   Pt has not had seasonal allergies until she had her 73 month old. She has had some runny nose, sneezing, itchy watery eyes, and sinus headaches lately. She does not typically take meds.   Pt has a 66 old so she is just being cautious. She is not currently breastfeeding.   Review of Systems  Constitutional: Negative for chills, diaphoresis, fatigue and fever.  HENT: Positive for sinus pressure. Negative for ear discharge, ear pain, sore throat and trouble swallowing.   Gastrointestinal: Negative for abdominal pain, diarrhea, nausea and vomiting.    Patient Active Problem List   Diagnosis Date Noted  . Hemorrhoids 05/03/2015  . 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 Mild dysplasia of cervix 07/16/2013  . Abnormal Pap smear of cervix 06/15/2012    Current Outpatient Prescriptions on File Prior to Visit  Medication Sig Dispense Refill  . norethindrone (MICRONOR,CAMILA,ERRIN) 0.35 MG tablet Take 1 tablet (0.35 mg total) by mouth daily. 1 Package 11   No current facility-administered medications on file prior to visit.     Allergies  Allergen Reactions  . Sulfa Antibiotics Rash    Objective:  BP 120/84 (BP Location: Right Arm, Patient Position: Sitting, Cuff Size: Normal)   Pulse (!) 55   Temp 98.9 F  (37.2 C) (Oral)   Resp 17   Ht 5\' 3"  (1.6 m)   Wt 151 lb (68.5 kg)   LMP 12/24/2015   SpO2 99%   BMI 26.75 kg/m   Physical Exam  Constitutional: She is oriented to person, place, and time and well-developed, well-nourished, and in no distress.  HENT:  Head: Normocephalic and atraumatic.  Right Ear: Tympanic membrane, external ear and ear canal normal.  Left Ear: Tympanic membrane, external ear and ear canal normal.  Nose: Mucosal edema present. Right sinus exhibits no maxillary sinus tenderness and no frontal sinus tenderness. Left sinus exhibits no maxillary sinus tenderness and no frontal sinus tenderness.  Eyes: EOM are normal. Pupils are equal, round, and reactive to light. Right eye exhibits no discharge and no hordeolum. No foreign body present in the right eye. Left eye exhibits no discharge and no hordeolum. No foreign body present in the left eye. Right conjunctiva is not injected. Left conjunctiva is injected.  Neck: Normal range of motion.  Pulmonary/Chest: Effort normal.  Neurological: She is alert and oriented to person, place, and time. Gait normal.  Skin: Skin is warm and dry.  Psychiatric: Affect normal.  Vitals reviewed.   It was recommended that we do a [place] eye exam but patient refused because she had to leave and take her baby to have an MRI.  Assessment and Plan :   1. Allergic rhinitis, unspecified chronicity, unspecified seasonality, unspecified trigger - cetirizine (ZYRTEC) 10 MG tablet; Take 1  tablet (10 mg total) by mouth daily.  Dispense: 30 tablet; Refill: 6  2. Irritation of left eye -Recommended to return immediately if she develops any pain, visual disturbance, fever, or eye discharge.  - ketorolac (ACULAR) 0.5 % ophthalmic solution; Place 1 drop into the left eye 4 (four) times daily.  Dispense: 5 mL; Refill: 0   Tenna Delaine PA-C  [facility] 01/09/2016 12:17 PM

## 2016-03-17 ENCOUNTER — Ambulatory Visit (INDEPENDENT_AMBULATORY_CARE_PROVIDER_SITE_OTHER): Payer: PRIVATE HEALTH INSURANCE | Admitting: Physician Assistant

## 2016-03-17 VITALS — BP 110/60 | HR 85 | Temp 97.8°F | Resp 16 | Ht 64.5 in | Wt 143.0 lb

## 2016-03-17 DIAGNOSIS — R350 Frequency of micturition: Secondary | ICD-10-CM

## 2016-03-17 DIAGNOSIS — N3000 Acute cystitis without hematuria: Secondary | ICD-10-CM

## 2016-03-17 LAB — POC MICROSCOPIC URINALYSIS (UMFC): MUCUS RE: ABSENT

## 2016-03-17 LAB — POCT URINALYSIS DIP (MANUAL ENTRY)
BILIRUBIN UA: NEGATIVE
Bilirubin, UA: NEGATIVE
GLUCOSE UA: NEGATIVE
Nitrite, UA: NEGATIVE
PROTEIN UA: NEGATIVE
RBC UA: NEGATIVE
UROBILINOGEN UA: 0.2
pH, UA: 6

## 2016-03-17 MED ORDER — NITROFURANTOIN MONOHYD MACRO 100 MG PO CAPS: 100.0000 mg | ORAL_CAPSULE | Freq: Two times a day (BID) | ORAL | 0 refills | Status: DC

## 2016-03-17 NOTE — Progress Notes (Signed)
   Emily Chan  MRN: [id number] DOB: 1980-07-17  Subjective:  Pt presents to clinic with urinary symptoms that started about 3 days ago.  She is having urinary frequency, urgency and dysuria. No hematuria and no back pain. No F/C.    No change in sexual partners and no concerns for STD.  No vaginal symptoms.  Review of Systems  Constitutional: Negative for chills and fever.  Gastrointestinal: Negative for nausea.  Genitourinary: Positive for dysuria, frequency and urgency. Negative for hematuria.    Patient Active Problem List   Diagnosis Date Noted  . Hemorrhoids 05/03/2015  . 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 Mild dysplasia of cervix 07/16/2013  . Abnormal Pap smear of cervix 06/15/2012    Current Outpatient Prescriptions on File Prior to Visit  Medication Sig Dispense Refill  . norethindrone (MICRONOR,CAMILA,ERRIN) 0.35 MG tablet Take 1 tablet (0.35 mg total) by mouth daily. 1 Package 11   No current facility-administered medications on file prior to visit.     Allergies  Allergen Reactions  . Sulfa Antibiotics Rash    Pt patients past, family and social history were reviewed and updated.   Objective:  BP 110/60   Pulse 85   Temp 97.8 F (36.6 C)   Resp 16   Ht 5' 4.5" (1.638 m)   Wt 143 lb (64.9 kg)   LMP 02/20/2016 (Exact Date)   SpO2 99%   Breastfeeding? No   BMI 24.17 kg/m   Physical Exam  Constitutional: She is oriented to person, place, and time and well-developed, well-nourished, and in no distress.  HENT:  Head: Normocephalic and atraumatic.  Right Ear: External ear normal.  Left Ear: External ear normal.  Cardiovascular: Normal rate, regular rhythm and normal heart sounds.   No murmur heard. Pulmonary/Chest: Effort normal and breath sounds normal.  Abdominal: Soft. There is tenderness in the suprapubic area. There is no CVA tenderness.  Neurological: She is alert and oriented to person, place, and time. Gait normal.  Skin:  Skin is warm and dry.  Psychiatric: Mood, memory, affect and judgment normal.  Vitals reviewed.  Results for orders placed or performed in visit on 03/17/16  POCT Microscopic Urinalysis ([facility])  Result Value Ref Range   WBC,UR,HPF,POC Moderate (A) None WBC/hpf   RBC,UR,HPF,POC None None RBC/hpf   Bacteria None None, Too numerous to count   Mucus Absent Absent   Epithelial Cells, UR Per Microscopy Few (A) None, Too numerous to count cells/hpf  POCT urinalysis dipstick  Result Value Ref Range   Color, UA yellow yellow   Clarity, UA clear clear   Glucose, UA negative negative   Bilirubin, UA negative negative   Ketones, POC UA negative negative   Spec Grav, UA >=1.030    Blood, UA negative negative   pH, UA 6.0    Protein Ur, POC negative negative   Urobilinogen, UA 0.2    Nitrite, UA Negative Negative   Leukocytes, UA Trace (A) Negative    Assessment and Plan :  Urinary frequency - Plan: POCT Microscopic Urinalysis ([facility]), POCT urinalysis dipstick, Urine culture  Acute cystitis without hematuria - Plan: nitrofurantoin, macrocrystal-monohydrate, (MACROBID) 100 MG capsule   Push fluids take abx  Windell Hummingbird PA-C  [facility] 03/17/2016 11:09 AM

## 2016-03-17 NOTE — Patient Instructions (Signed)
     IF you received an x-ray today, you will receive an invoice from [facility]. Please contact [facility] at [phone] with questions or concerns regarding your invoice.   IF you received labwork today, you will receive an invoice from [facility]. Please contact [facility] at [phone] with questions or concerns regarding your invoice.   Our billing staff will not be able to assist you with questions regarding bills from these companies.  You will be contacted with the lab results as soon as they are available. The fastest way to get your results is to activate your My Chart account. Instructions are located on the last page of this paperwork. If you have not heard from us regarding the results in 2 weeks, please contact this office.     

## 2016-03-20 LAB — URINE CULTURE

## 2016-05-28 ENCOUNTER — Ambulatory Visit (INDEPENDENT_AMBULATORY_CARE_PROVIDER_SITE_OTHER): Payer: PRIVATE HEALTH INSURANCE | Admitting: Obstetrics & Gynecology

## 2016-05-28 ENCOUNTER — Other Ambulatory Visit (INDEPENDENT_AMBULATORY_CARE_PROVIDER_SITE_OTHER): Payer: PRIVATE HEALTH INSURANCE

## 2016-05-28 ENCOUNTER — Encounter: Payer: Self-pay | Admitting: Obstetrics & Gynecology

## 2016-05-28 VITALS — BP 90/58 | HR 74 | Ht 63.0 in | Wt 143.0 lb

## 2016-05-28 DIAGNOSIS — N854 Malposition of uterus: Secondary | ICD-10-CM | POA: Diagnosis not present

## 2016-05-28 DIAGNOSIS — R102 Pelvic and perineal pain: Secondary | ICD-10-CM | POA: Diagnosis not present

## 2016-05-28 DIAGNOSIS — R319 Hematuria, unspecified: Secondary | ICD-10-CM

## 2016-05-28 DIAGNOSIS — N83292 Other ovarian cyst, left side: Secondary | ICD-10-CM

## 2016-05-28 DIAGNOSIS — R1032 Left lower quadrant pain: Secondary | ICD-10-CM | POA: Diagnosis not present

## 2016-05-28 LAB — POCT URINALYSIS DIPSTICK
Glucose, UA: NEGATIVE
KETONES UA: NEGATIVE
LEUKOCYTES UA: NEGATIVE
NITRITE UA: NEGATIVE
PROTEIN UA: NEGATIVE

## 2016-05-28 MED ORDER — HYDROCODONE-ACETAMINOPHEN 5-325 MG PO TABS: 1.0000 | ORAL_TABLET | Freq: Four times a day (QID) | ORAL | 0 refills | Status: DC | PRN

## 2016-05-28 MED ORDER — MEGESTROL ACETATE 40 MG PO TABS
40.0000 mg | ORAL_TABLET | Freq: Every day | ORAL | 1 refills | Status: DC
Start: 2016-05-28 — End: 2016-08-22

## 2016-05-28 NOTE — Progress Notes (Signed)
PELVIC US TA/TV: homogeneous retroverted uterus,wnl,EEC 1.7 mm,normal right ovary,simple left ovarian cyst 2.7 x 1.6 x 1.7 cm,complex cul de sac and bilat adnexal fluid,ovaries appear mobile,left adnexal pain during ultrasound.

## 2016-05-28 NOTE — Progress Notes (Signed)
Chief Complaint  Patient presents with  . cramps in left side    Blood pressure (!) 90/58, pulse 74, height 5\' 3"  (1.6 m), weight 143 lb (64.9 kg), last menstrual period 05/25/2016, not currently breastfeeding.  36 y.o. [id number] Patient's last menstrual period was 05/25/2016 (exact date). The current method of family planning is micronor  Outpatient Encounter Prescriptions as of 05/28/2016  Medication Sig  . norethindrone (MICRONOR,CAMILA,ERRIN) 0.35 MG tablet Take 1 tablet (0.35 mg total) by mouth daily.  Marland Kitchen HYDROcodone-acetaminophen (NORCO/VICODIN) 5-325 MG tablet Take 1 tablet by mouth every 6 (six) hours as needed.  . megestrol (MEGACE) 40 MG tablet Take 1 tablet (40 mg total) by mouth daily.  . [DISCONTINUED] nitrofurantoin, macrocrystal-monohydrate, (MACROBID) 100 MG capsule Take 1 capsule (100 mg total) by mouth 2 (two) times daily.   No facility-administered encounter medications on file as of 05/28/2016.     Subjective Pt presents with a few days history of LLQ pain, sharp stabbing, no nausea vomiting or diarrhea, radiates a little to her flank, has had similar pain before No pain with intercourse No discharge  Objective General WDWN female NAD Vulva:  normal appearing vulva with no masses, tenderness or lesions Vagina:  normal mucosa, no discharge Cervix:  no cervical motion tenderness and no lesions Uterus:  normal size, contour, position, consistency, mobility, non-tender Adnexa: ovaries:present,  normal adnexa in size,  and no masses, tenderness left with palpation of ovary   Pertinent ROS Per HPI No burning with urination, frequency or urgency No nausea, vomiting or diarrhea Nor fever chills or other constitutional symptoms   Labs or studies GYNECOLOGIC SONOGRAM   Emily Chan is a 36 y.o. [id number] LMP 05/25/2016 for a pelvic sonogram for LLQ pain.  Uterus                      7.7 x 5.9 x 4.2 cm, homogeneous retroverted uterus,wnl   Endometrium           1.7 mm, symmetrical, wnl  Right ovary             3.3 x 2.7 x 1.3 cm, wnl  Left ovary                3.9 x 2 x 2.2 cm, simple left ovarian cyst 2.7 x 1.6 x 1.7 cm  Complex cul de sac and bilat adnexal fluid  Technician Comments:  PELVIC US TA/TV: homogeneous retroverted uterus,wnl,EEC 1.7 mm,normal right ovary,simple left ovarian cyst 2.7 x 1.6 x 1.7 cm,complex cul de sac and bilat adnexal fluid,ovaries appear mobile,left adnexal pain during ultrasound.    Emily Chan 05/28/2016 1:10 PM  Clinical Impression and recommendations:  I have reviewed the sonogram results above, combined with the patient's current clinical course, below are my impressions and any appropriate recommendations for management based on the sonographic findings.  Uterus and adnexa is norma, on micronor Right ovary normal Left ovary with physiologic cyst, evidence of rupture but not on going bleeding  Impression: Physiologic cyst with evidence of leaking   [name],[name] 05/30/2016 2:12 PM    Impression Diagnoses this Encounter::   ICD-9-CM ICD-10-CM   1. Hematuria, unspecified type 599.70 R31.9 POCT Urinalysis Dipstick     Urine culture  2. LLQ pain 789.04 R10.32 US Pelvis Complete     US Transvaginal Non-OB     Urine culture    Established relevant diagnosis(es): History of previous ovarian cysts  Plan/Recommendations: Meds ordered this  encounter  Medications  . megestrol (MEGACE) 40 MG tablet    Sig: Take 1 tablet (40 mg total) by mouth daily.    Dispense:  30 tablet    Refill:  1  . HYDROcodone-acetaminophen (NORCO/VICODIN) 5-325 MG tablet    Sig: Take 1 tablet by mouth every 6 (six) hours as needed.    Dispense:  15 tablet    Refill:  0    Labs or Scans Ordered: Orders Placed This Encounter  Procedures  . Urine culture  . US Pelvis Complete  . US Transvaginal Non-OB  . POCT Urinalysis Dipstick    Management:: Will use megestrol to suppress the ovary and  cyst which should make it resolve Pt will return if pain worsens or persistss longer than the 1-2 months of the megestrol She will decide regarding appropriate follow up  Follow up Return if symptoms worsen or fail to improve.          All questions were answered.  Past Medical History:  Diagnosis Date  . Abdominal cramping affecting pregnancy 08/24/2014  . Abnormal Pap smear   . Bleeding in early pregnancy 09/02/2014   Pelvic rest  . Contraception management 06/16/2012  . Contraceptive management 05/03/2015  . Hemorrhoids 05/03/2015  . History of abnormal cervical Pap smear 08/18/2015  . Hx: UTI (urinary tract infection)   . Miscarriage   . Numbness in both legs 08/18/2015   Has numbness and tingling in both legs since epidural, on and off esp at night  . Pregnant 01/28/2014  . Vaginal irritation 12/25/2012  . Vaginal Pap smear, abnormal   . Yeast infection 12/25/2012    Past Surgical History:  Procedure Laterality Date  . APPENDECTOMY    . WISDOM TOOTH EXTRACTION      OB History    Gravida Para Term Preterm AB Living   2 1   1 1 1    SAB TAB Ectopic Multiple Live Births         0 1      Obstetric Comments   14.3wks missed ab, CRL measures 8wks      Allergies  Allergen Reactions  . Sulfa Antibiotics Rash    Social History   Social History  . Marital status: Married    Spouse name: N/A  . Number of children: N/A  . Years of education: N/A   Social History Main Topics  . Smoking status: Never Smoker  . Smokeless tobacco: Never Used  . Alcohol use Yes     Comment: occ.  . Drug use: No  . Sexual activity: Not Currently    Birth control/ protection: Pill   Other Topics Concern  . None   Social History Narrative  . None    Family History  Problem Relation Age of Onset  . Cancer Paternal Aunt     melenoma  . Diabetes Maternal Grandmother   . Hypertension Maternal Grandmother   . Cancer Maternal Grandfather     Brain and Lung  . Diabetes Maternal  Grandfather   . Cancer Paternal Grandmother     breast

## 2016-05-29 ENCOUNTER — Other Ambulatory Visit: Payer: PRIVATE HEALTH INSURANCE

## 2016-05-30 LAB — URINE CULTURE

## 2016-08-10 ENCOUNTER — Other Ambulatory Visit (HOSPITAL_COMMUNITY): Payer: Self-pay | Admitting: Pulmonary Disease

## 2016-08-10 DIAGNOSIS — R51 Headache: Principal | ICD-10-CM

## 2016-08-10 DIAGNOSIS — R55 Syncope and collapse: Secondary | ICD-10-CM

## 2016-08-10 DIAGNOSIS — R519 Headache, unspecified: Secondary | ICD-10-CM

## 2016-08-16 ENCOUNTER — Encounter (HOSPITAL_COMMUNITY): Payer: Self-pay

## 2016-08-16 ENCOUNTER — Ambulatory Visit (HOSPITAL_COMMUNITY)
Admission: RE | Admit: 2016-08-16 | Discharge: 2016-08-16 | Disposition: A | Discharge status: Home / Self Care | Payer: PRIVATE HEALTH INSURANCE | Source: Ambulatory Visit | Attending: Pulmonary Disease | Admitting: Pulmonary Disease

## 2016-08-16 ENCOUNTER — Ambulatory Visit (HOSPITAL_COMMUNITY): Admission: RE | Admit: 2016-08-16 | Payer: PRIVATE HEALTH INSURANCE | Source: Ambulatory Visit

## 2016-08-16 DIAGNOSIS — R55 Syncope and collapse: Secondary | ICD-10-CM

## 2016-08-16 DIAGNOSIS — R519 Headache, unspecified: Secondary | ICD-10-CM

## 2016-08-16 DIAGNOSIS — R51 Headache: Principal | ICD-10-CM

## 2016-08-22 ENCOUNTER — Encounter: Payer: Self-pay | Admitting: Family Medicine

## 2016-08-22 ENCOUNTER — Ambulatory Visit (INDEPENDENT_AMBULATORY_CARE_PROVIDER_SITE_OTHER): Payer: PRIVATE HEALTH INSURANCE | Admitting: Family Medicine

## 2016-08-22 VITALS — BP 110/71 | HR 70 | Temp 98.0°F | Resp 16 | Ht 63.39 in | Wt 146.0 lb

## 2016-08-22 DIAGNOSIS — S058X2A Other injuries of left eye and orbit, initial encounter: Secondary | ICD-10-CM | POA: Diagnosis not present

## 2016-08-22 DIAGNOSIS — H1032 Unspecified acute conjunctivitis, left eye: Secondary | ICD-10-CM | POA: Diagnosis not present

## 2016-08-22 MED ORDER — CIPROFLOXACIN HCL 0.3 % OP SOLN: OPHTHALMIC | 0 refills | Status: DC

## 2016-08-22 NOTE — Progress Notes (Signed)
Subjective:  This chart was scribed for Emily Agreste, MD, by Tamsen Roers, at [facility].  This patient was seen in room 1 and the patient's care was started at 8:20 AM.   Chief Complaint  Patient presents with  . Redness of eye    per patient, began appx 2 days ago, red and painful L eye     Patient ID: Emily Chan, female    DOB: [birth year], 36 y.o.   MRN: [id number]  HPI  HPI Comments: Emily Chan is a 36 y.o. female who presents to Primary Care at [facility] for intermittent left eye pain onset approximately two days ago with associated symptoms of redness.  Patient denies any recent injuries/scratching to her eye.   Patients symptoms initially started with redness in her eye and states that yesterday she woke up with increased soreness (especially when blinking).  Denies any discharge, fevers/chills, sore throat.   She has used artificial drops for relief.  Denies anyone in her household having similar symptoms.  Patient has had pink eye in the past and states that her symptoms do not feel "the same".   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    Patient Active Problem List   Diagnosis Date Noted  . Hemorrhoids 05/03/2015  . 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 Mild dysplasia of cervix 07/16/2013  . Abnormal Pap smear of cervix 06/15/2012   Past Medical History:  Diagnosis Date  . Abdominal cramping affecting pregnancy 08/24/2014  . Abnormal Pap smear   . Bleeding in early pregnancy 09/02/2014   Pelvic rest  . Contraception management 06/16/2012  . Contraceptive management 05/03/2015  . Hemorrhoids 05/03/2015  . History of abnormal cervical Pap smear 08/18/2015  . Hx: UTI (urinary tract infection)   . Miscarriage   . Numbness in both legs 08/18/2015   Has numbness and tingling in both legs since epidural, on and off esp at night  . Pregnant 01/28/2014  . Vaginal irritation 12/25/2012  . Vaginal Pap smear, abnormal   .  Yeast infection 12/25/2012   Past Surgical History:  Procedure Laterality Date  . APPENDECTOMY    . WISDOM TOOTH EXTRACTION     Allergies  Allergen Reactions  . Sulfa Antibiotics Rash   Prior to Admission medications   Medication Sig Start Date End Date Taking? Authorizing Provider  HYDROcodone-acetaminophen (NORCO/VICODIN) 5-325 MG tablet Take 1 tablet by mouth every 6 (six) hours as needed. 05/28/16   Florian Buff, MD  megestrol (MEGACE) 40 MG tablet Take 1 tablet (40 mg total) by mouth daily. 05/28/16   Florian Buff, MD  norethindrone (MICRONOR,CAMILA,ERRIN) 0.35 MG tablet Take 1 tablet (0.35 mg total) by mouth daily. 11/21/15   Estill Dooms, NP   Social History   Social History  . Marital status: Married    Spouse name: N/A  . Number of children: N/A  . Years of education: N/A   Occupational History  . Not on file.   Social History Main Topics  . Smoking status: Never Smoker  . Smokeless tobacco: Never Used  . Alcohol use Yes     Comment: occ.  . Drug use: No  . Sexual activity: Not Currently    Birth control/ protection: Pill   Other Topics Concern  . Not on file   Social History Narrative  . No narrative on file      Review of Systems  Constitutional: Negative for chills and  fever.  HENT: Negative for congestion, sinus pressure, sneezing and sore throat.   Eyes: Positive for pain and redness.  Respiratory: Negative for cough and shortness of breath.   Gastrointestinal: Negative for nausea and vomiting.  Neurological: Negative for syncope and speech difficulty.       Objective:   Physical Exam  Constitutional: She is oriented to person, place, and time. She appears well-developed and well-nourished. No distress.  HENT:  Head: Normocephalic and atraumatic.  Right Ear: Hearing, tympanic membrane, external ear and ear canal normal.  Left Ear: Hearing, tympanic membrane, external ear and ear canal normal.  Nose: Nose normal.  Mouth/Throat:  Oropharynx is clear and moist. No oropharyngeal exudate.  Eyes: EOM are normal. Pupils are equal, round, and reactive to light.  Left eye: Anterior chamber is clear. She has diffuse scleral injection throughout the lateral aspect of her left eye, moves to midline but does not cross.  cornea appears to be normal.    Cardiovascular: Normal rate, regular rhythm, normal heart sounds and intact distal pulses.   No murmur heard. Pulmonary/Chest: Effort normal and breath sounds normal. No respiratory distress. She has no wheezes. She has no rhonchi.  Neurological: She is alert and oriented to person, place, and time.  Skin: Skin is warm and dry. No rash noted.  Psychiatric: She has a normal mood and affect. Her behavior is normal.  Vitals reviewed.  Procedure:  1 drop of flucaine to the left eye.   Lids everted, no foreign body.  Small amount of uptake at approximately 4 oclock in the conjunctiva approx 2-3 mm lateral to the cornea, but I do not see any residual foreign body. No dendrites.      Vitals:   08/22/16 0809  BP: 110/71  Pulse: 70  Resp: 16  Temp: 98 F (36.7 C)  TempSrc: Oral  SpO2: 98%  Weight: 146 lb (66.2 kg)  Height: 5' 3.39" (1.61 m)     Visual Acuity Screening   Right eye Left eye Both eyes  Without correction:     With correction: 20/20 20/20 20/20         Assessment & Plan:   [name] is a 36 y.o. female Abrasion of sclera of left eye, initial encounter - Plan: ciprofloxacin (CILOXAN) 0.3 % ophthalmic solution  Acute conjunctivitis of left eye, unspecified acute conjunctivitis type - Plan: ciprofloxacin (CILOXAN) 0.3 % ophthalmic solution  Possible scleral abrasion versus small ulcer. Does not wear contact lenses. No dendrites or signs of HSV at this time. Increase in redness, possible secondary conjunctivitis/infection.  -Visual acuity intact  -start Ciloxan one drop every 2 hours 2 days then every 4 hours 5 days.  -Recheck in 48 hours if not  significantly improved, as may need optho eval at that time. Otherwise follow-up sooner if worse  Meds ordered this encounter  Medications  . ciprofloxacin (CILOXAN) 0.3 % ophthalmic solution    Sig: Administer 1 drop to left eye every 2 hours, while awake, for 2 days. Then 1 drop, every 4 hours, while awake, for the next 5 days.    Dispense:  5 mL    Refill:  0   Patient Instructions    You appear to have a small abrasion or possible small ulcer to the outer portion of your left eye. Start antibiotic drops every 2 hours today and tomorrow, then switch to every 4 hours. Return to see Jaynee Eagles, PAC in 2 days if you're not significantly improved. Sooner if worse.  IF you received an x-ray today, you will receive an invoice from [facility]. Please contact [facility] at [phone] with questions or concerns regarding your invoice.   IF you received labwork today, you will receive an invoice from [facility]. Please contact [facility] at [phone] with questions or concerns regarding your invoice.   Our billing staff will not be able to assist you with questions regarding bills from these companies.  You will be contacted with the lab results as soon as they are available. The fastest way to get your results is to activate your My Chart account. Instructions are located on the last page of this paperwork. If you have not heard from [place] regarding the results in 2 weeks, please contact this office.      I personally performed the services described in this documentation, which was scribed in my presence. The recorded information has been reviewed and considered for accuracy and completeness, addended by me as needed, and agree with information above.  Signed,   Merri Ray, MD Primary [facility] at [place].  08/22/16 8:49 AM

## 2016-08-22 NOTE — Patient Instructions (Addendum)
  You appear to have a small abrasion or possible small ulcer to the outer portion of your left eye. Start antibiotic drops every 2 hours today and tomorrow, then switch to every 4 hours. Return to see Jaynee Eagles, PAC in 2 days if you're not significantly improved. Sooner if worse.   IF you received an x-ray today, you will receive an invoice from [facility]. Please contact [facility] at [phone] with questions or concerns regarding your invoice.   IF you received labwork today, you will receive an invoice from [place]. Please contact [facility] at [phone] with questions or concerns regarding your invoice.   Our billing staff will not be able to assist you with questions regarding bills from these companies.  You will be contacted with the lab results as soon as they are available. The fastest way to get your results is to activate your My Chart account. Instructions are located on the last page of this paperwork. If you have not heard from [place] regarding the results in 2 weeks, please contact this office.

## 2016-08-27 ENCOUNTER — Other Ambulatory Visit (HOSPITAL_COMMUNITY)
Admission: RE | Admit: 2016-08-27 | Discharge: 2016-08-27 | Disposition: A | Discharge status: Home / Self Care | Payer: PRIVATE HEALTH INSURANCE | Source: Ambulatory Visit | Attending: Adult Health | Admitting: Adult Health

## 2016-08-27 ENCOUNTER — Encounter: Payer: Self-pay | Admitting: Adult Health

## 2016-08-27 ENCOUNTER — Ambulatory Visit (INDEPENDENT_AMBULATORY_CARE_PROVIDER_SITE_OTHER): Payer: PRIVATE HEALTH INSURANCE | Admitting: Adult Health

## 2016-08-27 VITALS — BP 110/74 | HR 84 | Ht 63.0 in | Wt 147.0 lb

## 2016-08-27 DIAGNOSIS — Z01419 Encounter for gynecological examination (general) (routine) without abnormal findings: Secondary | ICD-10-CM | POA: Diagnosis not present

## 2016-08-27 DIAGNOSIS — Z319 Encounter for procreative management, unspecified: Secondary | ICD-10-CM

## 2016-08-27 DIAGNOSIS — Z8742 Personal history of other diseases of the female genital tract: Secondary | ICD-10-CM

## 2016-08-27 DIAGNOSIS — R1032 Left lower quadrant pain: Secondary | ICD-10-CM

## 2016-08-27 NOTE — Progress Notes (Addendum)
Patient ID: Emily Chan, female   DOB: 1981/02/11, 36 y.o.   MRN: [id number] History of Present Illness: Emily Chan is a 36 year old white female, married in for well woman gyn exam and pap.She wants to start trying to get pregnant in September. Last pap was LSIL with +HPV 08/18/15.She had colpo 09/08/15,CIN1. PCP is Dr Luan Pulling.    Current Medications, Allergies, Past Medical History, Past Surgical History, Family History and Social History were reviewed in [employer] record.     Review of Systems: Patient denies any hearing loss, fatigue, blurred vision, shortness of breath, chest pain, abdominal pain(had LLQ pain at times), problems with bowel movements, urination, or intercourse. No joint pain or mood swings.Has been having headaches, awaiting MRI.    Physical Exam:BP 110/74 (BP Location: Right Arm, Patient Position: Sitting, Cuff Size: Normal)   Pulse 84   Ht 5\' 3"  (1.6 m)   Wt 147 lb (66.7 kg)   LMP 08/20/2016 (Exact Date)   BMI 26.04 kg/m  General:  Well developed, well nourished, no acute distress Skin:  Warm and dry Neck:  Midline trachea, normal thyroid, good ROM, no lymphadenopathy Lungs; Clear to auscultation bilaterally Breast:  No dominant palpable mass, retraction, or nipple discharge Cardiovascular: Regular rate and rhythm Abdomen:  Soft, non tender, no hepatosplenomegaly Pelvic:  External genitalia is normal in appearance, no lesions.  The vagina is normal in appearance. Urethra has no lesions or masses. The cervix is bulbous, irregular at os and friable with EC brush, pap with HPV performed.  Uterus is felt to be normal size, shape, and contour.  No adnexal masses or tenderness noted.Bladder is non tender, no masses felt. Extremities/musculoskeletal:  No swelling or varicosities noted, no clubbing or cyanosis Psych:  No mood changes, alert and cooperative,seems happy PHQ 2 score 0.  Impression:  1. Encounter for routine gynecological examination  with Papanicolaou smear of cervix   2. History of abnormal cervical Pap smear   3. LLQ pain   4. Patient desires pregnancy      Plan: Stop OCs after this pack Take prenatal vitamin with folic acid Call if pain returns Physical in 1 year Pap in 3 years if normal

## 2016-08-27 NOTE — Addendum Note (Signed)
Addended by: Gaylyn Rong A on: 08/27/2016 04:45 PM   Modules accepted: Orders

## 2016-08-30 LAB — CYTOLOGY - PAP
DIAGNOSIS: NEGATIVE
HPV (WINDOPATH): NOT DETECTED

## 2016-10-03 IMAGING — US US OB COMP LESS 14 WK
1 series · 13 of 28 positions shown · non-contrast
Comparison: 09/09/2014

CLINICAL DATA: Pregnancy with inconclusive fetal viability,
spotting for 1 day, pelvic pain for 5 days

EXAM:
OBSTETRIC <14 WK US AND TRANSVAGINAL OB US
TECHNIQUE: Both transabdominal and transvaginal ultrasound examinations were
performed for complete evaluation of the gestation as well as the
maternal uterus, adnexal regions, and pelvic cul-de-sac.
Transvaginal technique was performed to assess early pregnancy.

[Series 1: us ob comp less 14 wk · 0.17mm/px · 13 of 135 slices shown]
[im 5/135]
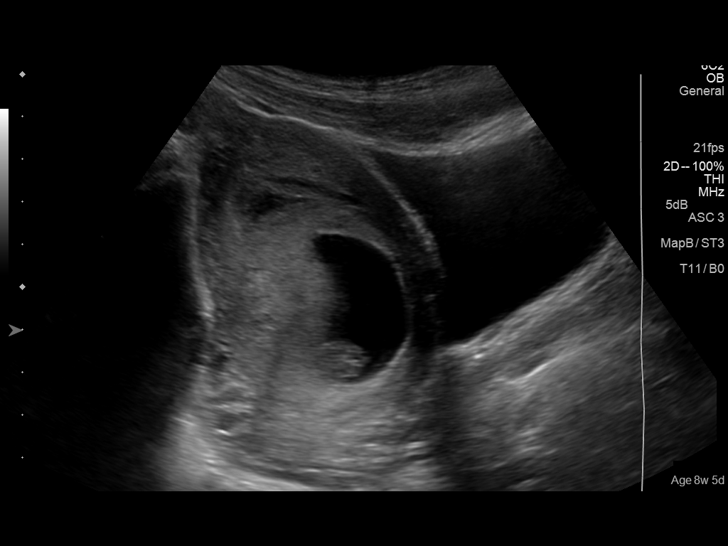
[im 15/135]
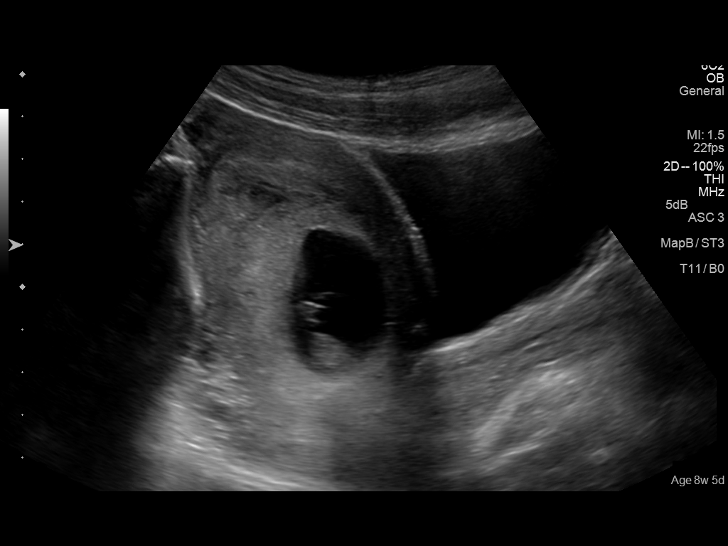
[im 25/135]
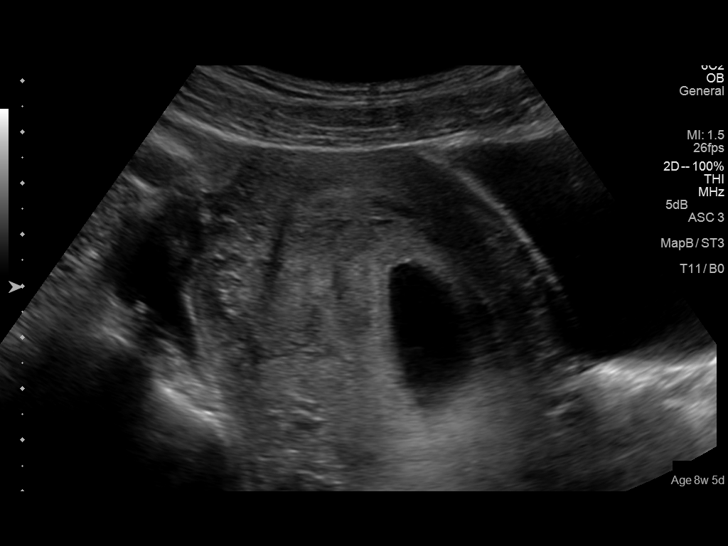
[im 35/135]
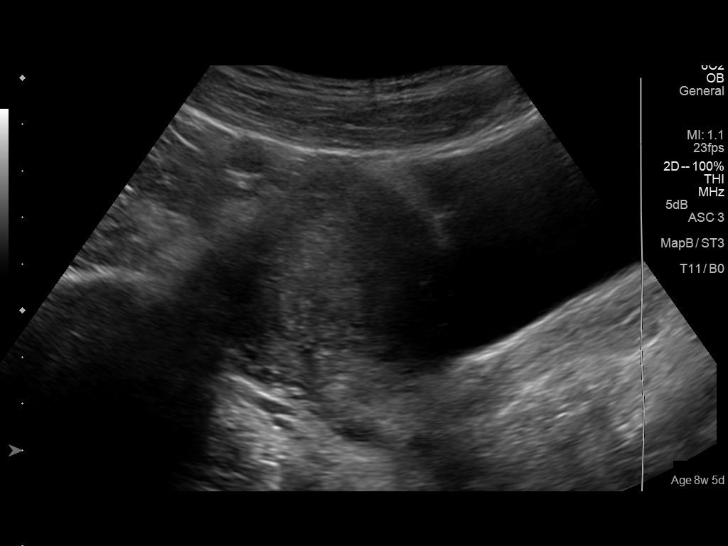
[im 45/135]
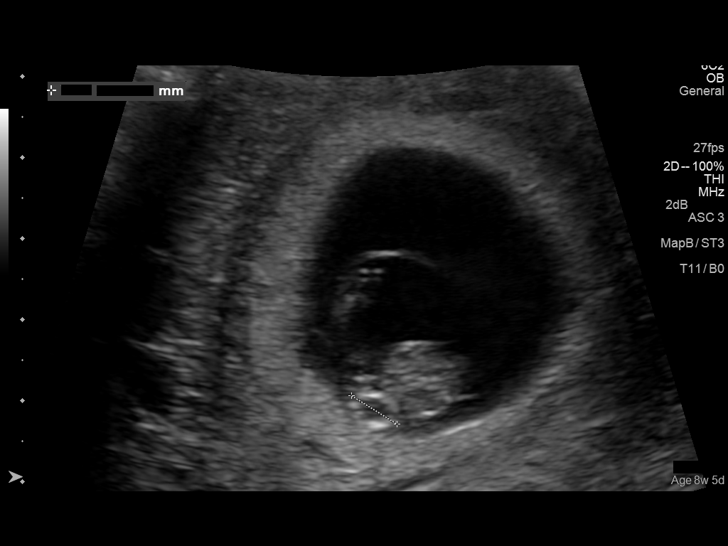
[im 55/135]
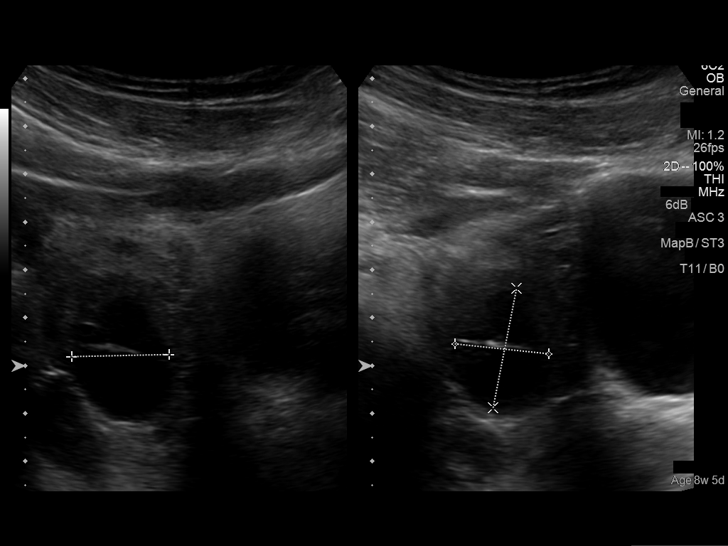
[im 70/135]
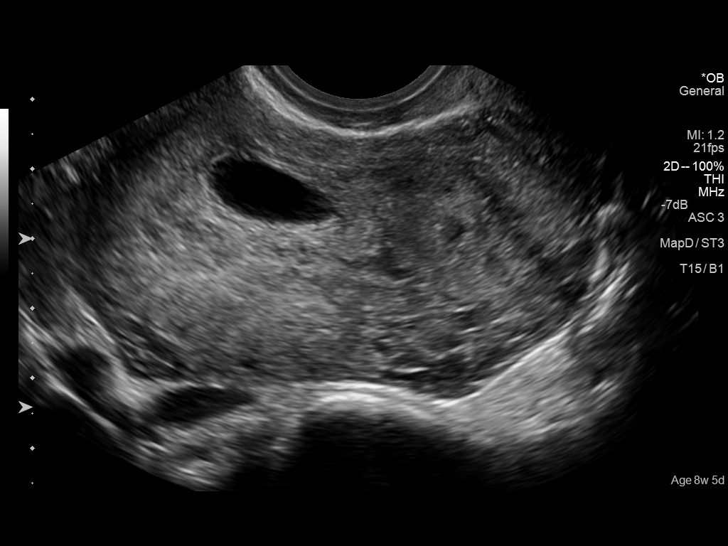
[im 80/135]
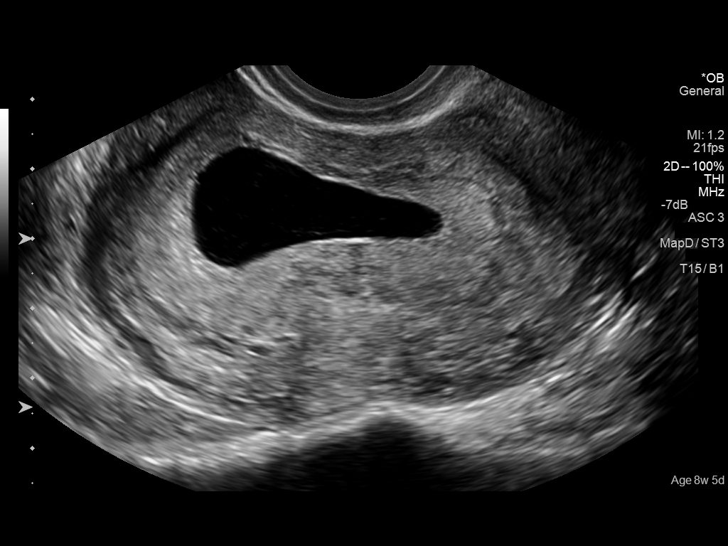
[im 90/135]
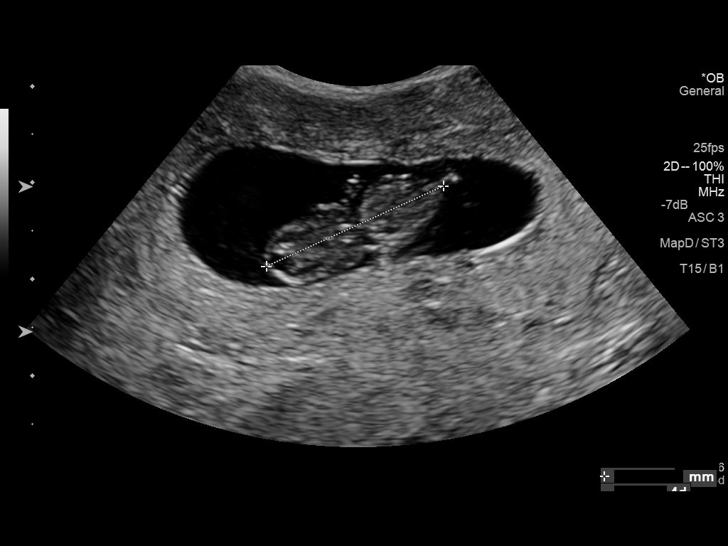
[im 100/135]
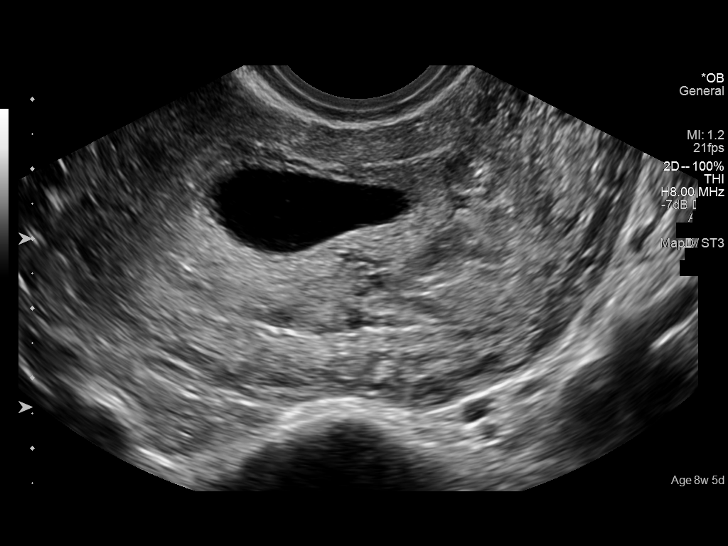
[im 110/135]
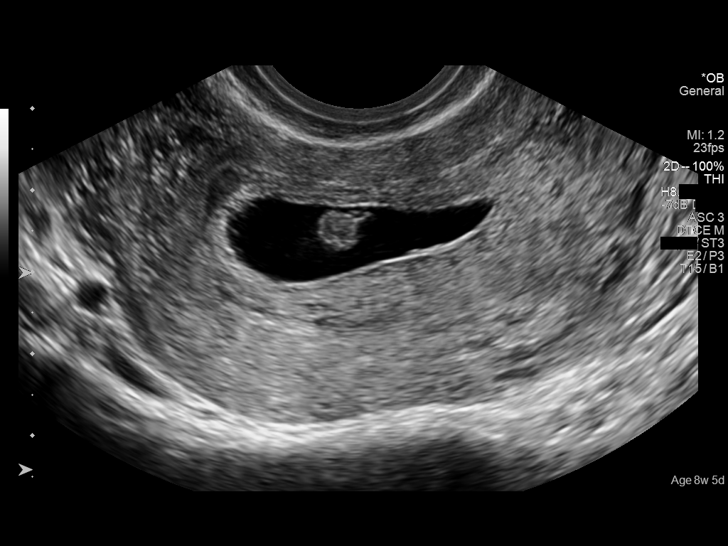
[im 120/135]
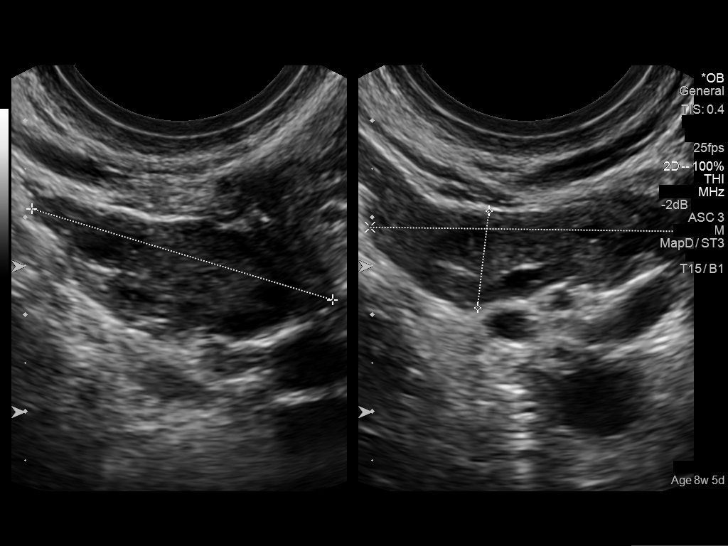
[im 130/135]
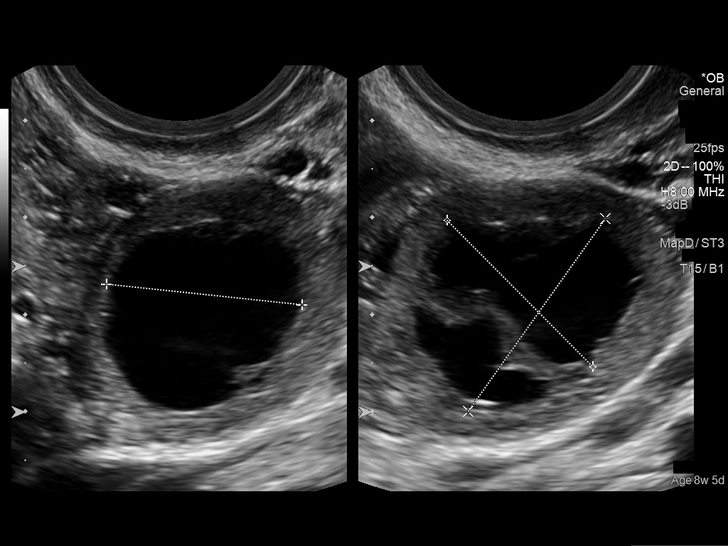

[13 of 28 positions shown; findings below may reference images not displayed]

FINDINGS: Intrauterine gestational sac: Visualized/normal in shape.

Yolk sac:  Present

Embryo:  Present

Cardiac Activity: Present

Heart Rate: 173  bpm

CRL:  20.1  mm   8 w   4 d                  US EDC: 04/24/2015

Maternal uterus/adnexae:

Physiologic chorioamniotic separation.

On transabdominal imaging, a small triangular subchorionic
hypoechoic collection is identified, not well delineated/localized
on transvaginal imaging, question representing an incidental small
implantation bleed 17 x 8 x 8 mm.

No free pelvic fluid.

RIGHT ovary normal size and morphology 3.2 x 3.3 x 1.0 cm.

LEFT ovary measures 3.0 x 3.9 x 2.9 cm a contains a small septated
cyst 2.0 x 2.4 x 2.1 cm.

No additional adnexal masses.
IMPRESSION: Single live intrauterine gestation as above.

Small complicated cyst LEFT ovary 2.0 x 2.4 x 2.1 cm.

Question incidental small implantation bleed.

## 2016-12-05 ENCOUNTER — Telehealth: Payer: Self-pay | Admitting: Obstetrics & Gynecology

## 2016-12-05 ENCOUNTER — Telehealth: Payer: Self-pay | Admitting: *Deleted

## 2016-12-05 NOTE — Telephone Encounter (Signed)
Called patient returning call but unable to leave message.

## 2016-12-05 NOTE — Telephone Encounter (Signed)
Patient states she is certain she has a UTI and went to urgent care. She wants to know what antibiotics are safe to take if pregnant. Advised patient to call back if she is prescribed something and we can double check to make sure its fine. Verbalized understanding.

## 2016-12-05 NOTE — Telephone Encounter (Signed)
Patient called and left a message on our machine stating that she would like a nurse to give her a call back. Pt states it has to do with UTI's. Please contact pt

## 2016-12-06 ENCOUNTER — Encounter: Payer: Self-pay | Admitting: Physician Assistant

## 2016-12-06 ENCOUNTER — Ambulatory Visit (INDEPENDENT_AMBULATORY_CARE_PROVIDER_SITE_OTHER): Payer: PRIVATE HEALTH INSURANCE | Admitting: Physician Assistant

## 2016-12-06 VITALS — BP 115/72 | HR 68 | Temp 97.9°F | Resp 16 | Ht 63.0 in | Wt 154.0 lb

## 2016-12-06 DIAGNOSIS — N3001 Acute cystitis with hematuria: Secondary | ICD-10-CM | POA: Diagnosis not present

## 2016-12-06 DIAGNOSIS — R350 Frequency of micturition: Secondary | ICD-10-CM | POA: Diagnosis not present

## 2016-12-06 DIAGNOSIS — R300 Dysuria: Secondary | ICD-10-CM | POA: Diagnosis not present

## 2016-12-06 LAB — POCT URINALYSIS DIP (MANUAL ENTRY)
BILIRUBIN UA: NEGATIVE mg/dL
Bilirubin, UA: NEGATIVE
Glucose, UA: NEGATIVE mg/dL
Nitrite, UA: NEGATIVE
PROTEIN UA: NEGATIVE mg/dL
SPEC GRAV UA: 1.015 (ref 1.010–1.025)
Urobilinogen, UA: 0.2 E.U./dL
pH, UA: 7 (ref 5.0–8.0)

## 2016-12-06 LAB — POCT URINE PREGNANCY: Preg Test, Ur: NEGATIVE

## 2016-12-06 MED ORDER — NITROFURANTOIN MONOHYD MACRO 100 MG PO CAPS: 100.0000 mg | ORAL_CAPSULE | Freq: Two times a day (BID) | ORAL | 0 refills | Status: AC

## 2016-12-06 NOTE — Patient Instructions (Addendum)
Please hydrate well with 64 oz per day. Tylenol or ibuprofen.  Urinary Tract Infection, Adult A urinary tract infection (UTI) is an infection of any part of the urinary tract. The urinary tract includes the:  Kidneys.  Ureters.  Bladder.  Urethra.  These organs make, store, and get rid of pee (urine) in the body. Follow these instructions at home:  Take over-the-counter and prescription medicines only as told by your doctor.  If you were prescribed an antibiotic medicine, take it as told by your doctor. Do not stop taking the antibiotic even if you start to feel better.  Avoid the following drinks: ? Alcohol. ? Caffeine. ? Tea. ? Carbonated drinks.  Drink enough fluid to keep your pee clear or pale yellow.  Keep all follow-up visits as told by your doctor. This is important.  Make sure to: ? Empty your bladder often and completely. Do not to hold pee for long periods of time. ? Empty your bladder before and after sex. ? Wipe from front to back after a bowel movement if you are female. Use each tissue one time when you wipe. Contact a doctor if:  You have back pain.  You have a fever.  You feel sick to your stomach (nauseous).  You throw up (vomit).  Your symptoms do not get better after 3 days.  Your symptoms go away and then come back. Get help right away if:  You have very bad back pain.  You have very bad lower belly (abdominal) pain.  You are throwing up and cannot keep down any medicines or water. This information is not intended to replace advice given to you by your health care provider. Make sure you discuss any questions you have with your health care provider. Document Released: 08/29/2007 Document Revised: 08/18/2015 Document Reviewed: 01/31/2015 Elsevier Interactive Patient Education  2018 [employer].     IF you received an x-ray today, you will receive an invoice from [facility]. Please contact [facility] at [phone]  with questions or concerns regarding your invoice.   IF you received labwork today, you will receive an invoice from [place]. Please contact [facility] at [phone] with questions or concerns regarding your invoice.   Our billing staff will not be able to assist you with questions regarding bills from these companies.  You will be contacted with the lab results as soon as they are available. The fastest way to get your results is to activate your My Chart account. Instructions are located on the last page of this paperwork. If you have not heard from [place] regarding the results in 2 weeks, please contact this office.

## 2016-12-06 NOTE — Progress Notes (Signed)
PRIMARY CARE AT [facility] [address], [place] [address] [phone]  Date:  12/06/2016   Name:  Emily Chan   DOB:  [birth year]   MRN:  [id number]  PCP:  Sinda Du, MD    History of Present Illness:  Emily Chan is a 36 y.o. female patient who presents to PCP with  Chief Complaint  Patient presents with  . Urinary Frequency    x 4 days  . Dysuria    x 4 days  . Possible Pregnancy    pt is trying to get pregnant     4 days urinary frequency, dysuria.  Seemed to resolve after 1 day, but then retruned.  No nausea.  No fever.  She had noticed darkening of the urine but no blood.  No vaginal itching or rash.  She is trying to conceive at this time.  Patient Active Problem List   Diagnosis Date Noted  . Hemorrhoids 05/03/2015  . 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 Mild dysplasia of cervix 07/16/2013  . Abnormal Pap smear of cervix 06/15/2012    Past Medical History:  Diagnosis Date  . Abdominal cramping affecting pregnancy 08/24/2014  . Abnormal Pap smear   . Bleeding in early pregnancy 09/02/2014   Pelvic rest  . Contraception management 06/16/2012  . Contraceptive management 05/03/2015  . Hemorrhoids 05/03/2015  . History of abnormal cervical Pap smear 08/18/2015  . Hx: UTI (urinary tract infection)   . Miscarriage   . Numbness in both legs 08/18/2015   Has numbness and tingling in both legs since epidural, on and off esp at night  . Pregnant 01/28/2014  . Vaginal irritation 12/25/2012  . Vaginal Pap smear, abnormal   . Yeast infection 12/25/2012    Past Surgical History:  Procedure Laterality Date  . APPENDECTOMY    . WISDOM TOOTH EXTRACTION      Social History  Substance Use Topics  . Smoking status: Never Smoker  . Smokeless tobacco: Never Used  . Alcohol use Yes     Comment: occ.    Family History  Problem Relation Age of Onset  . Cancer Paternal Aunt        melenoma  . Diabetes Maternal Grandmother   . Hypertension Maternal  Grandmother   . Cancer Maternal Grandfather        Brain and Lung  . Diabetes Maternal Grandfather   . Cancer Paternal Grandmother        breast    Allergies  Allergen Reactions  . Sulfa Antibiotics Rash    Medication list has been reviewed and updated.  No current outpatient prescriptions on file prior to visit.   No current facility-administered medications on file prior to visit.     ROS ROS otherwise unremarkable unless listed above.  Physical Examination: BP 115/72   Pulse 68   Temp 97.9 F (36.6 C) (Oral)   Resp 16   Ht 5\' 3"  (1.6 m)   Wt 154 lb (69.9 kg)   LMP 11/16/2016   SpO2 96%   BMI 27.28 kg/m  Ideal Body Weight: Weight in (lb) to have BMI = 25: 140.8  Physical Exam  Constitutional: She is oriented to person, place, and time. She appears well-developed and well-nourished. No distress.  HENT:  Head: Normocephalic and atraumatic.  Right Ear: External ear normal.  Left Ear: External ear normal.  Eyes: Pupils are equal, round, and reactive to light. Conjunctivae and EOM are normal.  Cardiovascular: Normal rate, regular rhythm and intact  distal pulses.   No murmur heard. Pulmonary/Chest: Effort normal. No respiratory distress. She has no wheezes.  Abdominal: Soft. Normal appearance and bowel sounds are normal. There is no tenderness.  Neurological: She is alert and oriented to person, place, and time.  Skin: She is not diaphoretic.  Psychiatric: She has a normal mood and affect. Her behavior is normal.   Results for orders placed or performed in visit on 12/06/16  POCT urinalysis dipstick  Result Value Ref Range   Color, UA yellow yellow   Clarity, UA clear clear   Glucose, UA negative negative mg/dL   Bilirubin, UA negative negative   Ketones, POC UA negative negative mg/dL   Spec Grav, UA 1.015 1.010 - 1.025   Blood, UA trace-lysed (A) negative   pH, UA 7.0 5.0 - 8.0   Protein Ur, POC negative negative mg/dL   Urobilinogen, UA 0.2 0.2 or 1.0  E.U./dL   Nitrite, UA Negative Negative   Leukocytes, UA Small (1+) (A) Negative      Assessment and Plan: Emily Chan is a 36 y.o. female who is here today for cc of urinary frequency Urine culture ordered Starting macrobid Acute cystitis with hematuria - Plan: nitrofurantoin, macrocrystal-monohydrate, (MACROBID) 100 MG capsule  Urinary frequency - Plan: POCT urinalysis dipstick, POCT urine pregnancy, nitrofurantoin, macrocrystal-monohydrate, (MACROBID) 100 MG capsule  Dysuria - Plan: Urine Culture  Ivar Drape, PA-C [facility] 9/13/20189:26 AM

## 2016-12-08 LAB — URINE CULTURE

## 2017-01-17 ENCOUNTER — Encounter: Payer: Self-pay | Admitting: Obstetrics & Gynecology

## 2017-01-17 ENCOUNTER — Ambulatory Visit (INDEPENDENT_AMBULATORY_CARE_PROVIDER_SITE_OTHER): Payer: PRIVATE HEALTH INSURANCE | Admitting: Obstetrics & Gynecology

## 2017-01-17 VITALS — BP 100/60 | HR 82 | Wt 155.0 lb

## 2017-01-17 DIAGNOSIS — N83202 Unspecified ovarian cyst, left side: Secondary | ICD-10-CM

## 2017-01-17 DIAGNOSIS — R1032 Left lower quadrant pain: Secondary | ICD-10-CM

## 2017-01-17 MED ORDER — MEGESTROL ACETATE 40 MG PO TABS: ORAL_TABLET | ORAL | 3 refills | Status: DC

## 2017-01-17 NOTE — Progress Notes (Signed)
Chief Complaint  Patient presents with  . left lower quarant pain      36 y.o. [id number] Patient's last menstrual period was 01/12/2017. The current method of family planning is none.  Outpatient Encounter Prescriptions as of 01/17/2017  Medication Sig  . Prenatal Vit-Fe Fumarate-FA (MULTIVITAMIN-PRENATAL) 27-0.8 MG TABS tablet Take 1 tablet by mouth daily at 12 noon.  . megestrol (MEGACE) 40 MG tablet 3 tablets a day for 5 days, 2 tablets a day for 5 days then 1 tablet daily   No facility-administered encounter medications on file as of 01/17/2017.     Subjective Emily Chan presents to the office today with a 2 week history of left lower quadrant pain which she has had in the past Most recently this was last March at which time she had a persistent left ovarian cyst which resolved on megestrol She did have a urinary tract infection a month ago but this is completely different an unrelated She says the pain feels exactly like it did in March She is off birth control pills as they are trying to get pregnant Again her pain is in the left lower quadrant it is sharp and stabbing moderately severe last most of the day and worse at the end of her work week she works as a Programme researcher, 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 Abdominal cramping affecting pregnancy 08/24/2014  . Abnormal Pap smear   . Bleeding in early pregnancy 09/02/2014   Pelvic rest  . Contraception management 06/16/2012  . Contraceptive management 05/03/2015  . Hemorrhoids 05/03/2015  . History of abnormal cervical Pap smear 08/18/2015  . Hx: UTI (urinary tract infection)   . Miscarriage   . Numbness in both legs 08/18/2015   Has numbness and tingling in both legs since epidural, on and off esp at night  . Pregnant 01/28/2014  . Vaginal irritation 12/25/2012  .  Vaginal Pap smear, abnormal   . Yeast infection 12/25/2012    Past Surgical History:  Procedure Laterality Date  . APPENDECTOMY    . WISDOM TOOTH EXTRACTION      OB History    Gravida Para Term Preterm AB Living   2 1   1 1 1    SAB TAB Ectopic Multiple Live Births         0 1      Obstetric Comments   14.3wks missed ab, CRL measures 8wks      Allergies  Allergen Reactions  . Sulfa Antibiotics Rash    Social History   Social History  . Marital status: Married    Spouse name: N/A  . Number of children: N/A  . Years of education: N/A   Social History Main Topics  . Smoking status: Never Smoker  . Smokeless tobacco: Never Used  . Alcohol use Yes     Comment: occ.  . Drug use: No  . Sexual activity: Yes    Birth control/ protection: Pill   Other Topics Concern  . None   Social History Narrative  . None    Family History  Problem Relation Age of Onset  . Cancer Paternal Aunt        melenoma  . Diabetes Maternal Grandmother   . Hypertension Maternal Grandmother   . Cancer Maternal Grandfather  Brain and Lung  . Diabetes Maternal Grandfather   . Cancer Paternal Grandmother        breast    Medications:       Current Outpatient Prescriptions:  .  Prenatal Vit-Fe Fumarate-FA (MULTIVITAMIN-PRENATAL) 27-0.8 MG TABS tablet, Take 1 tablet by mouth daily at 12 noon., Disp: , Rfl:  .  megestrol (MEGACE) 40 MG tablet, 3 tablets a day for 5 days, 2 tablets a day for 5 days then 1 tablet daily, Disp: 45 tablet, Rfl: 3  Objective Blood pressure 100/60, pulse 82, weight 155 lb (70.3 kg), last menstrual period 01/12/2017, not currently breastfeeding.  General WDWN female NAD Vulva:  normal appearing vulva with no masses, tenderness or lesions Vagina:  normal mucosa, scant blood, no discharge, no  lesions Cervix:  multiparous appearance, no cervical motion tenderness and no lesions Uterus:  normal size, contour, position, consistency, mobility,  non-tender Adnexa: ovaries:present,  Normal size ovaries bilaterally no masses with definite tenderness of the left ovary with palpation   Pertinent ROS No burning with urination, frequency or urgency No nausea, vomiting or diarrhea Nor fever chills or other constitutional symptoms   Labs or studies No new labs or scans to review    Impression Diagnoses this Encounter::   ICD-10-CM   1. LLQ pain R10.32   2. Cyst of left ovary N83.202     Established relevant diagnosis(es): History of left ovarian cysts in the pasts, that have resolved  Plan/Recommendations: Meds ordered this encounter  Medications  . Prenatal Vit-Fe Fumarate-FA (MULTIVITAMIN-PRENATAL) 27-0.8 MG TABS tablet    Sig: Take 1 tablet by mouth daily at 12 noon.  . megestrol (MEGACE) 40 MG tablet    Sig: 3 tablets a day for 5 days, 2 tablets a day for 5 days then 1 tablet daily    Dispense:  45 tablet    Refill:  3    Labs or Scans Ordered: No orders of the defined types were placed in this encounter.   Management:: We will do one month of megestrol therapy 3 tablets a day for 5 days 2 tablets a day for 5 days then one a day I offered T I offered her oradol but she states she's been take ibuprofen over-the-counter ibuprofen or naproxen sodium and wants to stick with that We'll see her back if her symptoms worsen Otherwise she can take one month of megestrol, off of it and go back to trying to get pregnant again   Follow up Return if symptoms worsen or fail to improve.     All questions were answered.

## 2017-02-18 ENCOUNTER — Ambulatory Visit: Payer: PRIVATE HEALTH INSURANCE | Admitting: Adult Health

## 2017-02-18 ENCOUNTER — Encounter: Payer: Self-pay | Admitting: Adult Health

## 2017-02-18 VITALS — BP 120/80 | HR 94 | Ht 63.0 in | Wt 154.0 lb

## 2017-02-18 DIAGNOSIS — O26851 Spotting complicating pregnancy, first trimester: Secondary | ICD-10-CM | POA: Insufficient documentation

## 2017-02-18 DIAGNOSIS — Z3A01 Less than 8 weeks gestation of pregnancy: Secondary | ICD-10-CM | POA: Insufficient documentation

## 2017-02-18 DIAGNOSIS — N926 Irregular menstruation, unspecified: Secondary | ICD-10-CM

## 2017-02-18 DIAGNOSIS — Z3201 Encounter for pregnancy test, result positive: Secondary | ICD-10-CM | POA: Diagnosis not present

## 2017-02-18 DIAGNOSIS — O3680X0 Pregnancy with inconclusive fetal viability, not applicable or unspecified: Secondary | ICD-10-CM

## 2017-02-18 LAB — POCT URINE PREGNANCY: Preg Test, Ur: POSITIVE — AB

## 2017-02-18 NOTE — Progress Notes (Signed)
Subjective:     Patient ID: Emily Chan, female   DOB: 04-Jul-1980, 36 y.o.   MRN: [id number]  HPI Emily Chan is a 36 year old white female in for UPT, has missed a period and had +HPT, has some cramping and spotting. Blood type O+.   Review of Systems +missed period,with +HPT +spotting, and some cramping Reviewed past medical,surgical, social and family history. Reviewed medications and allergies.     Objective:   Physical Exam BP 120/80 (BP Location: Left Arm, Patient Position: Sitting, Cuff Size: Normal)   Pulse 94   Ht 5\' 3"  (1.6 m)   Wt 154 lb (69.9 kg)   LMP 01/12/2017 (Exact Date)   BMI 27.28 kg/m +UPT, about 5+2 weeks by LMP with EDD 10/20/17. Skin warm and dry. Neck: mid line trachea, normal thyroid, good ROM, no lymphadenopathy noted. Lungs: clear to ausculation bilaterally. Cardiovascular: regular rate and rhythm.Abdomen is soft and non tender,PHQ 2 score 0. Will check labs and order [place], no heavy lifting.     Assessment:     1. Pregnancy test positive   2. Less than [redacted] weeks gestation of pregnancy   3. Encounter to determine fetal viability of pregnancy, single or unspecified fetus   4. Spotting affecting pregnancy in first trimester       Plan:     Check QHCG and progesterone level Dating [place] in 2 weeks Review handout by [facility]

## 2017-02-19 ENCOUNTER — Telehealth: Payer: Self-pay | Admitting: Adult Health

## 2017-02-19 LAB — BETA HCG QUANT (REF LAB): hCG Quant: 1974 m[IU]/mL

## 2017-02-19 LAB — PROGESTERONE: Progesterone: 11.6 ng/mL

## 2017-02-19 NOTE — Telephone Encounter (Signed)
Pt aware of labs, will move [place] to next week

## 2017-02-25 ENCOUNTER — Other Ambulatory Visit: Payer: Self-pay | Admitting: Adult Health

## 2017-02-25 ENCOUNTER — Ambulatory Visit (INDEPENDENT_AMBULATORY_CARE_PROVIDER_SITE_OTHER): Payer: PRIVATE HEALTH INSURANCE

## 2017-02-25 DIAGNOSIS — O3680X0 Pregnancy with inconclusive fetal viability, not applicable or unspecified: Secondary | ICD-10-CM

## 2017-02-25 DIAGNOSIS — Z3A01 Less than 8 weeks gestation of pregnancy: Secondary | ICD-10-CM | POA: Diagnosis not present

## 2017-02-25 NOTE — Progress Notes (Addendum)
[place] TA/TV:6+2 wks GS,crl 1.72 mm (out of range),positive fht 101 bpm,normal right ovary,simple left exophytic cyst 2.4 x 1.9 x 1.6 cm,pt will come back in 10 days for f/u ultrasound.

## 2017-03-04 ENCOUNTER — Other Ambulatory Visit: Payer: PRIVATE HEALTH INSURANCE

## 2017-03-06 ENCOUNTER — Other Ambulatory Visit: Payer: Self-pay | Admitting: Obstetrics and Gynecology

## 2017-03-06 DIAGNOSIS — O3680X0 Pregnancy with inconclusive fetal viability, not applicable or unspecified: Secondary | ICD-10-CM

## 2017-03-07 ENCOUNTER — Ambulatory Visit (INDEPENDENT_AMBULATORY_CARE_PROVIDER_SITE_OTHER): Payer: PRIVATE HEALTH INSURANCE

## 2017-03-07 DIAGNOSIS — O3680X0 Pregnancy with inconclusive fetal viability, not applicable or unspecified: Secondary | ICD-10-CM

## 2017-03-07 DIAGNOSIS — Z3A01 Less than 8 weeks gestation of pregnancy: Secondary | ICD-10-CM

## 2017-03-07 NOTE — Progress Notes (Signed)
[place] 7 wks,single IUP w/ys,small subchorionic hemorrhage 14.5 x 4.6 mm,normal ovaries bilat,crl 9.57 mm,fhr 133 bpm,EDD 10/19/2017 by LMP

## 2017-03-11 ENCOUNTER — Other Ambulatory Visit: Payer: PRIVATE HEALTH INSURANCE

## 2017-03-26 NOTE — L&D Delivery Note (Addendum)
Patient is 37 y.o. [id number] [redacted]w[redacted]d admitted for SOL.Marland Kitchen AROM at 0507.    Delivery Note At 10:39 AM a viable female was delivered via Vaginal, Spontaneous (Presentation: OA; cephalic).  APGAR: 3, 5, 8; weight 8'1 .   Placenta status: intact, 3V Cord:  with the following complications: none.  Cord pH: 7.173  Head delivered OA. No nuchal cord present. Compound anterior hand was swept downward toward abdomen. Shoulder and body delivered in usual fashion. Infant with no cry initially or respiratory effort initially, cord cut without delay and infant to warmer, NICU team called to assist. After baby improved he was placed on mother's abdomen.  Cord blood drawn. Placenta delivered spontaneously with gentle cord traction. Fundus firm with massage and Pitocin. Perineum inspected and found to have 2nd degree laceration, which was repaired with 2.0 vicryl with good hemostasis achieved.    Anesthesia:  epidural Episiotomy: None Lacerations: 2nd degree;Perineal Suture Repair: 2.0 vicryl rapide Est. Blood Loss (mL):  315  Mom to postpartum.  Baby to Couplet care / Skin to Skin.  Benay Pike 10/23/2017, 11:29 AM   Midwife attestation: I was gloved and present for delivery in its entirety and I agree with the above resident's note.  Julianne Handler, CNM 12:50 PM

## 2017-03-28 ENCOUNTER — Ambulatory Visit (INDEPENDENT_AMBULATORY_CARE_PROVIDER_SITE_OTHER): Payer: PRIVATE HEALTH INSURANCE | Admitting: Women's Health

## 2017-03-28 ENCOUNTER — Encounter: Payer: Self-pay | Admitting: Women's Health

## 2017-03-28 ENCOUNTER — Other Ambulatory Visit: Payer: Self-pay

## 2017-03-28 ENCOUNTER — Ambulatory Visit: Payer: PRIVATE HEALTH INSURANCE | Admitting: *Deleted

## 2017-03-28 VITALS — BP 118/72 | HR 77 | Wt 153.0 lb

## 2017-03-28 DIAGNOSIS — Z3481 Encounter for supervision of other normal pregnancy, first trimester: Secondary | ICD-10-CM

## 2017-03-28 DIAGNOSIS — Z3490 Encounter for supervision of normal pregnancy, unspecified, unspecified trimester: Secondary | ICD-10-CM | POA: Insufficient documentation

## 2017-03-28 DIAGNOSIS — O09211 Supervision of pregnancy with history of pre-term labor, first trimester: Secondary | ICD-10-CM

## 2017-03-28 DIAGNOSIS — O09219 Supervision of pregnancy with history of pre-term labor, unspecified trimester: Secondary | ICD-10-CM

## 2017-03-28 DIAGNOSIS — Z331 Pregnant state, incidental: Secondary | ICD-10-CM

## 2017-03-28 DIAGNOSIS — O09899 Supervision of other high risk pregnancies, unspecified trimester: Secondary | ICD-10-CM

## 2017-03-28 DIAGNOSIS — Z141 Cystic fibrosis carrier: Secondary | ICD-10-CM

## 2017-03-28 DIAGNOSIS — Z1389 Encounter for screening for other disorder: Secondary | ICD-10-CM

## 2017-03-28 DIAGNOSIS — Z3480 Encounter for supervision of other normal pregnancy, unspecified trimester: Secondary | ICD-10-CM

## 2017-03-28 DIAGNOSIS — Z3A10 10 weeks gestation of pregnancy: Secondary | ICD-10-CM

## 2017-03-28 LAB — POCT URINALYSIS DIPSTICK
Glucose, UA: NEGATIVE
Ketones, UA: NEGATIVE
Leukocytes, UA: NEGATIVE
NITRITE UA: NEGATIVE
PROTEIN UA: NEGATIVE
RBC UA: NEGATIVE

## 2017-03-28 NOTE — Patient Instructions (Signed)
Harrington Challenger, I greatly value your feedback.  If you receive a survey following your visit with [place] today, we appreciate you taking the time to fill it out.  Thanks, Knute Neu, CNM, WHNP-BC   Nausea & Vomiting  Have saltine crackers or pretzels by your bed and eat a few bites before you raise your head out of bed in the morning  Eat small frequent meals throughout the day instead of large meals  Drink plenty of fluids throughout the day to stay hydrated, just don't drink a lot of fluids with your meals.  This can make your stomach fill up faster making you feel sick  Do not brush your teeth right after you eat  Products with real ginger are good for nausea, like ginger ale and ginger hard candy Make sure it says made with real ginger!  Sucking on sour candy like lemon heads is also good for nausea  If your prenatal vitamins make you nauseated, take them at night so you will sleep through the nausea  Sea Bands  If you feel like you need medicine for the nausea & vomiting please let us know  If you are unable to keep any fluids or food down please let us know   Constipation  Drink plenty of fluid, preferably water, throughout the day  Eat foods high in fiber such as fruits, vegetables, and grains  Exercise, such as walking, is a good way to keep your bowels regular  Drink warm fluids, especially warm prune juice, or decaf coffee  Eat a 1/2 cup of real oatmeal (not instant), 1/2 cup applesauce, and 1/2-1 cup warm prune juice every day  If needed, you may take Colace (docusate sodium) stool softener once or twice a day to help keep the stool soft. If you are pregnant, wait until you are out of your first trimester (12-14 weeks of pregnancy)  If you still are having problems with constipation, you may take Miralax once daily as needed to help keep your bowels regular.  If you are pregnant, wait until you are out of your first trimester (12-14 weeks of pregnancy)   First  Trimester of Pregnancy The first trimester of pregnancy is from week 1 until the end of week 12 (months 1 through 3). A week after a sperm fertilizes an egg, the egg will implant on the wall of the uterus. This embryo will begin to develop into a baby. Genes from you and your partner are forming the baby. The female genes determine whether the baby is a boy or a girl. At 6-8 weeks, the eyes and face are formed, and the heartbeat can be seen on ultrasound. At the end of 12 weeks, all the baby's organs are formed.  Now that you are pregnant, you will want to do everything you can to have a healthy baby. Two of the most important things are to get good prenatal care and to follow your health care provider's instructions. Prenatal care is all the medical care you receive before the baby's birth. This care will help prevent, find, and treat any problems during the pregnancy and childbirth. BODY CHANGES Your body goes through many changes during pregnancy. The changes vary from woman to woman.   You may gain or lose a couple of pounds at first.  You may feel sick to your stomach (nauseous) and throw up (vomit). If the vomiting is uncontrollable, call your health care provider.  You may tire easily.  You may develop headaches that  can be relieved by medicines approved by your health care provider.  You may urinate more often. Painful urination may mean you have a bladder infection.  You may develop heartburn as a result of your pregnancy.  You may develop constipation because certain hormones are causing the muscles that push waste through your intestines to slow down.  You may develop hemorrhoids or swollen, bulging veins (varicose veins).  Your breasts may begin to grow larger and become tender. Your nipples may stick out more, and the tissue that surrounds them (areola) may become darker.  Your gums may bleed and may be sensitive to brushing and flossing.  Dark spots or blotches (chloasma, mask  of pregnancy) may develop on your face. This will likely fade after the baby is born.  Your menstrual periods will stop.  You may have a loss of appetite.  You may develop cravings for certain kinds of food.  You may have changes in your emotions from day to day, such as being excited to be pregnant or being concerned that something may go wrong with the pregnancy and baby.  You may have more vivid and strange dreams.  You may have changes in your hair. These can include thickening of your hair, rapid growth, and changes in texture. Some women also have hair loss during or after pregnancy, or hair that feels dry or thin. Your hair will most likely return to normal after your baby is born. WHAT TO EXPECT AT YOUR PRENATAL VISITS During a routine prenatal visit:  You will be weighed to make sure you and the baby are growing normally.  Your blood pressure will be taken.  Your abdomen will be measured to track your baby's growth.  The fetal heartbeat will be listened to starting around week 10 or 12 of your pregnancy.  Test results from any previous visits will be discussed. Your health care provider may ask you:  How you are feeling.  If you are feeling the baby move.  If you have had any abnormal symptoms, such as leaking fluid, bleeding, severe headaches, or abdominal cramping.  If you have any questions. Other tests that may be performed during your first trimester include:  Blood tests to find your blood type and to check for the presence of any previous infections. They will also be used to check for low iron levels (anemia) and Rh antibodies. Later in the pregnancy, blood tests for diabetes will be done along with other tests if problems develop.  Urine tests to check for infections, diabetes, or protein in the urine.  An ultrasound to confirm the proper growth and development of the baby.  An amniocentesis to check for possible genetic problems.  Fetal screens for spina  bifida and Down syndrome.  You may need other tests to make sure you and the baby are doing well. HOME CARE INSTRUCTIONS  Medicines  Follow your health care provider's instructions regarding medicine use. Specific medicines may be either safe or unsafe to take during pregnancy.  Take your prenatal vitamins as directed.  If you develop constipation, try taking a stool softener if your health care provider approves. Diet  Eat regular, well-balanced meals. Choose a variety of foods, such as meat or vegetable-based protein, fish, milk and low-fat dairy products, vegetables, fruits, and whole grain breads and cereals. Your health care provider will help you determine the amount of weight gain that is right for you.  Avoid raw meat and uncooked cheese. These carry germs that can cause  birth defects in the baby.  Eating four or five small meals rather than three large meals a day may help relieve nausea and vomiting. If you start to feel nauseous, eating a few soda crackers can be helpful. Drinking liquids between meals instead of during meals also seems to help nausea and vomiting.  If you develop constipation, eat more high-fiber foods, such as fresh vegetables or fruit and whole grains. Drink enough fluids to keep your urine clear or pale yellow. Activity and Exercise  Exercise only as directed by your health care provider. Exercising will help you:  Control your weight.  Stay in shape.  Be prepared for labor and delivery.  Experiencing pain or cramping in the lower abdomen or low back is a good sign that you should stop exercising. Check with your health care provider before continuing normal exercises.  Try to avoid standing for long periods of time. Move your legs often if you must stand in one place for a long time.  Avoid heavy lifting.  Wear low-heeled shoes, and practice good posture.  You may continue to have sex unless your health care provider directs you  otherwise. Relief of Pain or Discomfort  Wear a good support bra for breast tenderness.   Take warm sitz baths to soothe any pain or discomfort caused by hemorrhoids. Use hemorrhoid cream if your health care provider approves.   Rest with your legs elevated if you have leg cramps or low back pain.  If you develop varicose veins in your legs, wear support hose. Elevate your feet for 15 minutes, 3-4 times a day. Limit salt in your diet. Prenatal Care  Schedule your prenatal visits by the twelfth week of pregnancy. They are usually scheduled monthly at first, then more often in the last 2 months before delivery.  Write down your questions. Take them to your prenatal visits.  Keep all your prenatal visits as directed by your health care provider. Safety  Wear your seat belt at all times when driving.  Make a list of emergency phone numbers, including numbers for family, friends, the hospital, and police and fire departments. General Tips  Ask your health care provider for a referral to a local prenatal education class. Begin classes no later than at the beginning of month 6 of your pregnancy.  Ask for help if you have counseling or nutritional needs during pregnancy. Your health care provider can offer advice or refer you to specialists for help with various needs.  Do not use hot tubs, steam rooms, or saunas.  Do not douche or use tampons or scented sanitary pads.  Do not cross your legs for long periods of time.  Avoid cat litter boxes and soil used by cats. These carry germs that can cause birth defects in the baby and possibly loss of the fetus by miscarriage or stillbirth.  Avoid all smoking, herbs, alcohol, and medicines not prescribed by your health care provider. Chemicals in these affect the formation and growth of the baby.  Schedule a dentist appointment. At home, brush your teeth with a soft toothbrush and be gentle when you floss. SEEK MEDICAL CARE IF:   You have  dizziness.  You have mild pelvic cramps, pelvic pressure, or nagging pain in the abdominal area.  You have persistent nausea, vomiting, or diarrhea.  You have a bad smelling vaginal discharge.  You have pain with urination.  You notice increased swelling in your face, hands, legs, or ankles. SEEK IMMEDIATE MEDICAL CARE IF:  You have a fever.  You are leaking fluid from your vagina.  You have spotting or bleeding from your vagina.  You have severe abdominal cramping or pain.  You have rapid weight gain or loss.  You vomit blood or material that looks like coffee grounds.  You are exposed to [place] measles and have never had them.  You are exposed to fifth disease or chickenpox.  You develop a severe headache.  You have shortness of breath.  You have any kind of trauma, such as from a fall or a car accident. Document Released: 03/06/2001 Document Revised: 07/27/2013 Document Reviewed: 01/20/2013 [facility] Patient Information 2015 [place], [place]. This information is not intended to replace advice given to you by your health care provider. Make sure you discuss any questions you have with your health care provider.

## 2017-03-28 NOTE — Progress Notes (Signed)
INITIAL OBSTETRICAL VISIT Patient name: Emily Chan MRN [id number]  Date of birth: 07/26/1980 Chief Complaint:   Initial Prenatal Visit  History of Present Illness:   Emily Chan is a 37 y.o. [phone] Caucasian female at [redacted]w[redacted]d by LMP c/w 7wk u/s, with an Estimated Date of Delivery: 10/19/17 being seen today for her initial obstetrical visit.   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   Today she reports no complaints.  Patient's last menstrual period was 01/12/2017 (exact date). Last pap 08/27/16. Results were: normal Review of Systems:   Pertinent items are noted in HPI Denies cramping/contractions, leakage of fluid, vaginal bleeding, abnormal vaginal discharge w/ itching/odor/irritation, headaches, visual changes, shortness of breath, chest pain, abdominal pain, severe nausea/vomiting, or problems with urination or bowel movements unless otherwise stated above.  Pertinent History Reviewed:  Reviewed past medical,surgical, social, obstetrical and family history.  Reviewed problem list, medications and allergies. OB History  Gravida Para Term Preterm AB Living  3 1   1 1 1   SAB TAB Ectopic Multiple Live Births  1     0 1    # Outcome Date GA Lbr Len/2nd Weight Sex Delivery Anes PTL Lv  3 Current           2 Preterm 03/24/15 [redacted]w[redacted]d 10:50 / 01:03 5 lb 9.1 oz (2.525 kg) M Vag-Spont EPI N LIV  1 SAB 03/29/14 [redacted]w[redacted]d           Obstetric Comments  14.3wks missed ab, CRL measures 8wks   Physical Assessment:   Vitals:   03/28/17 1440  BP: 118/72  Pulse: 77  Weight: 153 lb (69.4 kg)  Body mass index is 27.1 kg/m.       Physical Examination:  General appearance - well appearing, and in no distress  Mental status - alert, oriented to person, place, and time  Psych:  She has a normal mood and affect  Skin - warm and dry, normal color, no suspicious lesions noted  Chest - effort normal, all lung  fields clear to auscultation bilaterally  Heart - normal rate and regular rhythm  Abdomen - soft, nontender  Extremities:  No swelling or varicosities noted  Thin prep pap is not done  Fetal Heart Rate (bpm): +u/s via informal transabdominal u/s, +active fetus  Results for orders placed or performed in visit on 03/28/17 (from the past 24 hour(s))  POCT urinalysis dipstick   Collection Time: 03/28/17  3:15 PM  Result Value Ref Range   Color, UA     Clarity, UA     Glucose, UA neg    Bilirubin, UA     Ketones, UA neg    Spec Grav, UA  1.010 - 1.025   Blood, UA neg    pH, UA  5.0 - 8.0   Protein, UA neg    Urobilinogen, UA  0.2 or 1.0 E.U./dL   Nitrite, UA neg    Leukocytes, UA Negative Negative   Appearance     Odor      Assessment & Plan:  1) Low-Risk Pregnancy [id number] at [redacted]w[redacted]d with an Estimated Date of Delivery: 10/19/17   2) Initial OB visit  3) H/O 35wk PTB d/t spontaneous labor> discussed Makena, wants, ordered today, to start @ 16wks  4) 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   Orders Placed This Encounter  Procedures  . GC/Chlamydia Probe Amp  . Urine Culture  . Obstetric Panel, Including HIV  . Urinalysis, Routine w reflex microscopic  . Pain Management Screening Profile (10S)  . POCT urinalysis dipstick    Tawnya Crook CNM, [facility] 03/28/2017 5:10 PM

## 2017-03-29 LAB — URINALYSIS, ROUTINE W REFLEX MICROSCOPIC
Bilirubin, UA: NEGATIVE
Glucose, UA: NEGATIVE
KETONES UA: NEGATIVE
NITRITE UA: NEGATIVE
PROTEIN UA: NEGATIVE
SPEC GRAV UA: 1.02 (ref 1.005–1.030)
Urobilinogen, Ur: 1 mg/dL (ref 0.2–1.0)
pH, UA: 7 (ref 5.0–7.5)

## 2017-03-29 LAB — OBSTETRIC PANEL, INCLUDING HIV
ANTIBODY SCREEN: NEGATIVE
Basophils Absolute: 0 10*3/uL (ref 0.0–0.2)
Basos: 0 %
EOS (ABSOLUTE): 0.1 10*3/uL (ref 0.0–0.4)
Eos: 1 %
HIV Screen 4th Generation wRfx: NONREACTIVE
Hematocrit: 39.3 % (ref 34.0–46.6)
Hemoglobin: 13.9 g/dL (ref 11.1–15.9)
Hepatitis B Surface Ag: NEGATIVE
IMMATURE GRANS (ABS): 0 10*3/uL (ref 0.0–0.1)
IMMATURE GRANULOCYTES: 0 %
LYMPHS: 27 %
Lymphocytes Absolute: 2.3 10*3/uL (ref 0.7–3.1)
MCH: 29.8 pg (ref 26.6–33.0)
MCHC: 35.4 g/dL (ref 31.5–35.7)
MCV: 84 fL (ref 79–97)
MONOS ABS: 0.5 10*3/uL (ref 0.1–0.9)
Monocytes: 6 %
NEUTROS PCT: 66 %
Neutrophils Absolute: 5.4 10*3/uL (ref 1.4–7.0)
Platelets: 170 10*3/uL (ref 150–379)
RBC: 4.67 x10E6/uL (ref 3.77–5.28)
RDW: 13.9 % (ref 12.3–15.4)
RPR Ser Ql: NONREACTIVE
Rh Factor: POSITIVE
Rubella Antibodies, IGG: 2.57 index (ref >0.99)
WBC: 8.4 10*3/uL (ref 3.4–10.8)

## 2017-03-29 LAB — MICROSCOPIC EXAMINATION: CASTS: NONE SEEN /LPF

## 2017-03-30 LAB — URINE CULTURE: Organism ID, Bacteria: NO GROWTH

## 2017-03-30 LAB — PMP SCREEN PROFILE (10S), URINE
Amphetamine Scrn, Ur: NEGATIVE ng/mL
BARBITURATE SCREEN URINE: NEGATIVE ng/mL
BENZODIAZEPINE SCREEN, URINE: NEGATIVE ng/mL
CANNABINOIDS UR QL SCN: NEGATIVE ng/mL
COCAINE(METAB.)SCREEN, URINE: NEGATIVE ng/mL
Creatinine(Crt), U: 101.4 mg/dL (ref 20.0–300.0)
METHADONE SCREEN, URINE: NEGATIVE ng/mL
OXYCODONE+OXYMORPHONE UR QL SCN: NEGATIVE ng/mL
Opiate Scrn, Ur: NEGATIVE ng/mL
PROPOXYPHENE SCREEN URINE: NEGATIVE ng/mL
Ph of Urine: 6.9 (ref 4.5–8.9)
Phencyclidine Qn, Ur: NEGATIVE ng/mL

## 2017-03-31 LAB — GC/CHLAMYDIA PROBE AMP
CHLAMYDIA, DNA PROBE: NEGATIVE
Neisseria gonorrhoeae by PCR: NEGATIVE

## 2017-04-15 ENCOUNTER — Encounter: Payer: Self-pay | Admitting: Women's Health

## 2017-04-16 ENCOUNTER — Other Ambulatory Visit: Payer: Self-pay | Admitting: Women's Health

## 2017-04-16 MED ORDER — OSELTAMIVIR PHOSPHATE 75 MG PO CAPS: 75.0000 mg | ORAL_CAPSULE | Freq: Two times a day (BID) | ORAL | 0 refills | Status: DC

## 2017-04-16 NOTE — Progress Notes (Signed)
Pt sent mychart message, has flu, rx tamiflu. Keep appt 1/28 as scheduled. Call/go to hospital for worsening sx.  [name], CNM, [facility] 04/16/2017 11:02 AM

## 2017-04-22 ENCOUNTER — Ambulatory Visit (INDEPENDENT_AMBULATORY_CARE_PROVIDER_SITE_OTHER): Payer: PRIVATE HEALTH INSURANCE | Admitting: Women's Health

## 2017-04-22 ENCOUNTER — Encounter: Payer: Self-pay | Admitting: Women's Health

## 2017-04-22 VITALS — BP 90/60 | HR 98 | Wt 151.0 lb

## 2017-04-22 DIAGNOSIS — Z3482 Encounter for supervision of other normal pregnancy, second trimester: Secondary | ICD-10-CM

## 2017-04-22 DIAGNOSIS — Z331 Pregnant state, incidental: Secondary | ICD-10-CM

## 2017-04-22 DIAGNOSIS — Z3A14 14 weeks gestation of pregnancy: Secondary | ICD-10-CM

## 2017-04-22 DIAGNOSIS — Z1389 Encounter for screening for other disorder: Secondary | ICD-10-CM

## 2017-04-22 DIAGNOSIS — Z3481 Encounter for supervision of other normal pregnancy, first trimester: Secondary | ICD-10-CM

## 2017-04-22 DIAGNOSIS — Z363 Encounter for antenatal screening for malformations: Secondary | ICD-10-CM

## 2017-04-22 DIAGNOSIS — O09212 Supervision of pregnancy with history of pre-term labor, second trimester: Secondary | ICD-10-CM

## 2017-04-22 LAB — POCT URINALYSIS DIPSTICK
GLUCOSE UA: NEGATIVE
Ketones, UA: NEGATIVE
LEUKOCYTES UA: NEGATIVE
Nitrite, UA: NEGATIVE
Protein, UA: NEGATIVE
RBC UA: NEGATIVE

## 2017-04-22 NOTE — Progress Notes (Signed)
   LOW-RISK PREGNANCY VISIT Patient name: Emily Chan MRN [id number]  Date of birth: 1980/09/10 Chief Complaint:   Routine Prenatal Visit (talk about 17p shot)  History of Present Illness:   Emily Chan is a 37 y.o. [phone] female at [redacted]w[redacted]d with an Estimated Date of Delivery: 10/19/17 being seen today for ongoing management of a low-risk pregnancy.  Today she reports still n/v. Declines meds. Questions about Makena answered.  .  .  Movement: Absent. denies leaking of fluid. Review of Systems:   Pertinent items are noted in HPI Denies abnormal vaginal discharge w/ itching/odor/irritation, headaches, visual changes, shortness of breath, chest pain, abdominal pain, severe nausea/vomiting, or problems with urination or bowel movements unless otherwise stated above. Pertinent History Reviewed:  Reviewed past medical,surgical, social, obstetrical and family history.  Reviewed problem list, medications and allergies. Physical Assessment:   Vitals:   04/22/17 1006  BP: 90/60  Pulse: 98  Weight: 151 lb (68.5 kg)  Body mass index is 26.75 kg/m.        Physical Examination:   General appearance: Well appearing, and in no distress  Mental status: Alert, oriented to person, place, and time  Skin: Warm & dry  Cardiovascular: Normal heart rate noted  Respiratory: Normal respiratory effort, no distress  Abdomen: Soft, gravid, nontender  Pelvic: Cervical exam deferred         Extremities: Edema: None  Fetal Status: Fetal Heart Rate (bpm): 155   Movement: Absent    Results for orders placed or performed in visit on 04/22/17 (from the past 24 hour(s))  POCT urinalysis dipstick   Collection Time: 04/22/17 10:12 AM  Result Value Ref Range   Color, UA     Clarity, UA     Glucose, UA neg    Bilirubin, UA     Ketones, UA neg    Spec Grav, UA  1.010 - 1.025   Blood, UA neg    pH, UA  5.0 - 8.0   Protein, UA neg    Urobilinogen, UA  0.2 or 1.0 E.U./dL   Nitrite, UA neg    Leukocytes, UA  Negative Negative   Appearance     Odor      Assessment & Plan:  1) Low-risk pregnancy [id number] at [redacted]w[redacted]d with an Estimated Date of Delivery: 10/19/17   2) H/O 35wk PTB, questions about Makena answered today, pt does want- states she will call today to finish order   Meds: No orders of the defined types were placed in this encounter.  Labs/procedures today: n/a, declined genetic screening  Plan:  Continue routine obstetrical care   Reviewed: Preterm labor symptoms and general obstetric precautions including but not limited to vaginal bleeding, contractions, leaking of fluid and fetal movement were reviewed in detail with the patient.  All questions were answered  Follow-up: Return in about 2 weeks (around 05/06/2017) for for 1st Makena, , Weekly 17P- next LROB in 4wks with anatomy u/s.  Orders Placed This Encounter  Procedures  . POCT urinalysis dipstick   Tawnya Crook CNM, [facility] 04/22/2017 10:49 AM

## 2017-04-22 NOTE — Patient Instructions (Addendum)
Emily Chan, I greatly value your feedback.  If you receive a survey following your visit with [place] today, we appreciate you taking the time to fill it out.  Thanks, Knute Neu, CNM, WHNP-BC  Nausea & Vomiting  Have saltine crackers or pretzels by your bed and eat a few bites before you raise your head out of bed in the morning  Eat small frequent meals throughout the day instead of large meals  Drink plenty of fluids throughout the day to stay hydrated, just don't drink a lot of fluids with your meals.  This can make your stomach fill up faster making you feel sick  Do not brush your teeth right after you eat  Products with real ginger are good for nausea, like ginger ale and ginger hard candy Make sure it says made with real ginger!  Sucking on sour candy like lemon heads is also good for nausea  If your prenatal vitamins make you nauseated, take them at night so you will sleep through the nausea  Sea Bands  If you feel like you need medicine for the nausea & vomiting please let us know  If you are unable to keep any fluids or food down please let us know      Second Trimester of Pregnancy The second trimester is from week 14 through week 27 (months 4 through 6). The second trimester is often a time when you feel your best. Your body has adjusted to being pregnant, and you begin to feel better physically. Usually, morning sickness has lessened or quit completely, you may have more energy, and you may have an increase in appetite. The second trimester is also a time when the fetus is growing rapidly. At the end of the sixth month, the fetus is about 9 inches long and weighs about 1 pounds. You will likely begin to feel the baby move (quickening) between 16 and 20 weeks of pregnancy. Body changes during your second trimester Your body continues to go through many changes during your second trimester. The changes vary from woman to woman.  Your weight will continue to increase. You  will notice your lower abdomen bulging out.  You may begin to get stretch marks on your hips, abdomen, and breasts.  You may develop headaches that can be relieved by medicines. The medicines should be approved by your health care provider.  You may urinate more often because the fetus is pressing on your bladder.  You may develop or continue to have heartburn as a result of your pregnancy.  You may develop constipation because certain hormones are causing the muscles that push waste through your intestines to slow down.  You may develop hemorrhoids or swollen, bulging veins (varicose veins).  You may have back pain. This is caused by: ? Weight gain. ? Pregnancy hormones that are relaxing the joints in your pelvis. ? A shift in weight and the muscles that support your balance.  Your breasts will continue to grow and they will continue to become tender.  Your gums may bleed and may be sensitive to brushing and flossing.  Dark spots or blotches (chloasma, mask of pregnancy) may develop on your face. This will likely fade after the baby is born.  A dark line from your belly button to the pubic area (linea nigra) may appear. This will likely fade after the baby is born.  You may have changes in your hair. These can include thickening of your hair, rapid growth, and changes in texture.  Some women also have hair loss during or after pregnancy, or hair that feels dry or thin. Your hair will most likely return to normal after your baby is born.  What to expect at prenatal visits During a routine prenatal visit:  You will be weighed to make sure you and the fetus are growing normally.  Your blood pressure will be taken.  Your abdomen will be measured to track your baby's growth.  The fetal heartbeat will be listened to.  Any test results from the previous visit will be discussed.  Your health care provider may ask you:  How you are feeling.  If you are feeling the baby  move.  If you have had any abnormal symptoms, such as leaking fluid, bleeding, severe headaches, or abdominal cramping.  If you are using any tobacco products, including cigarettes, chewing tobacco, and electronic cigarettes.  If you have any questions.  Other tests that may be performed during your second trimester include:  Blood tests that check for: ? Low iron levels (anemia). ? High blood sugar that affects pregnant women (gestational diabetes) between 85 and 28 weeks. ? Rh antibodies. This is to check for a protein on red blood cells (Rh factor).  Urine tests to check for infections, diabetes, or protein in the urine.  An ultrasound to confirm the proper growth and development of the baby.  An amniocentesis to check for possible genetic problems.  Fetal screens for spina bifida and Down syndrome.  HIV (human immunodeficiency virus) testing. Routine prenatal testing includes screening for HIV, unless you choose not to have this test.  Follow these instructions at home: Medicines  Follow your health care provider's instructions regarding medicine use. Specific medicines may be either safe or unsafe to take during pregnancy.  Take a prenatal vitamin that contains at least 600 micrograms (mcg) of folic acid.  If you develop constipation, try taking a stool softener if your health care provider approves. Eating and drinking  Eat a balanced diet that includes fresh fruits and vegetables, whole grains, good sources of protein such as meat, eggs, or tofu, and low-fat dairy. Your health care provider will help you determine the amount of weight gain that is right for you.  Avoid raw meat and uncooked cheese. These carry germs that can cause birth defects in the baby.  If you have low calcium intake from food, talk to your health care provider about whether you should take a daily calcium supplement.  Limit foods that are high in fat and processed sugars, such as fried and sweet  foods.  To prevent constipation: ? Drink enough fluid to keep your urine clear or pale yellow. ? Eat foods that are high in fiber, such as fresh fruits and vegetables, whole grains, and beans. Activity  Exercise only as directed by your health care provider. Most women can continue their usual exercise routine during pregnancy. Try to exercise for 30 minutes at least 5 days a week. Stop exercising if you experience uterine contractions.  Avoid heavy lifting, wear low heel shoes, and practice good posture.  A sexual relationship may be continued unless your health care provider directs you otherwise. Relieving pain and discomfort  Wear a good support bra to prevent discomfort from breast tenderness.  Take warm sitz baths to soothe any pain or discomfort caused by hemorrhoids. Use hemorrhoid cream if your health care provider approves.  Rest with your legs elevated if you have leg cramps or low back pain.  If you develop  varicose veins, wear support hose. Elevate your feet for 15 minutes, 3-4 times a day. Limit salt in your diet. Prenatal Care  Write down your questions. Take them to your prenatal visits.  Keep all your prenatal visits as told by your health care provider. This is important. Safety  Wear your seat belt at all times when driving.  Make a list of emergency phone numbers, including numbers for family, friends, the hospital, and police and fire departments. General instructions  Ask your health care provider for a referral to a local prenatal education class. Begin classes no later than the beginning of month 6 of your pregnancy.  Ask for help if you have counseling or nutritional needs during pregnancy. Your health care provider can offer advice or refer you to specialists for help with various needs.  Do not use hot tubs, steam rooms, or saunas.  Do not douche or use tampons or scented sanitary pads.  Do not cross your legs for long periods of time.  Avoid cat  litter boxes and soil used by cats. These carry germs that can cause birth defects in the baby and possibly loss of the fetus by miscarriage or stillbirth.  Avoid all smoking, herbs, alcohol, and unprescribed drugs. Chemicals in these products can affect the formation and growth of the baby.  Do not use any products that contain nicotine or tobacco, such as cigarettes and e-cigarettes. If you need help quitting, ask your health care provider.  Visit your dentist if you have not gone yet during your pregnancy. Use a soft toothbrush to brush your teeth and be gentle when you floss. Contact a health care provider if:  You have dizziness.  You have mild pelvic cramps, pelvic pressure, or nagging pain in the abdominal area.  You have persistent nausea, vomiting, or diarrhea.  You have a bad smelling vaginal discharge.  You have pain when you urinate. Get help right away if:  You have a fever.  You are leaking fluid from your vagina.  You have spotting or bleeding from your vagina.  You have severe abdominal cramping or pain.  You have rapid weight gain or weight loss.  You have shortness of breath with chest pain.  You notice sudden or extreme swelling of your face, hands, ankles, feet, or legs.  You have not felt your baby move in over an hour.  You have severe headaches that do not go away when you take medicine.  You have vision changes. Summary  The second trimester is from week 14 through week 27 (months 4 through 6). It is also a time when the fetus is growing rapidly.  Your body goes through many changes during pregnancy. The changes vary from woman to woman.  Avoid all smoking, herbs, alcohol, and unprescribed drugs. These chemicals affect the formation and growth your baby.  Do not use any tobacco products, such as cigarettes, chewing tobacco, and e-cigarettes. If you need help quitting, ask your health care provider.  Contact your health care provider if you have  any questions. Keep all prenatal visits as told by your health care provider. This is important. This information is not intended to replace advice given to you by your health care provider. Make sure you discuss any questions you have with your health care provider. Document Released: 03/06/2001 Document Revised: 08/18/2015 Document Reviewed: 05/13/2012 Elsevier Interactive Patient Education  2017 [employer].

## 2017-04-23 ENCOUNTER — Telehealth: Payer: Self-pay | Admitting: *Deleted

## 2017-04-23 NOTE — Telephone Encounter (Signed)
Makena to be shipped and should arrive on 05/01/17

## 2017-05-06 ENCOUNTER — Ambulatory Visit (INDEPENDENT_AMBULATORY_CARE_PROVIDER_SITE_OTHER): Payer: PRIVATE HEALTH INSURANCE | Admitting: *Deleted

## 2017-05-06 DIAGNOSIS — Z3A15 15 weeks gestation of pregnancy: Secondary | ICD-10-CM

## 2017-05-06 DIAGNOSIS — O09212 Supervision of pregnancy with history of pre-term labor, second trimester: Secondary | ICD-10-CM

## 2017-05-06 DIAGNOSIS — Z8751 Personal history of pre-term labor: Secondary | ICD-10-CM

## 2017-05-06 MED ORDER — HYDROXYPROGESTERONE CAPROATE 275 MG/1.1ML ~~LOC~~ SOAJ
275.0000 mg | SUBCUTANEOUS | Status: AC
  Administered 2017-05-06 – 2017-09-19 (×19): 275 mg via SUBCUTANEOUS

## 2017-05-06 NOTE — Progress Notes (Signed)
Pt in for first 17 P injection. Pt unable to void.

## 2017-05-08 ENCOUNTER — Encounter: Payer: Self-pay | Admitting: Women's Health

## 2017-05-09 ENCOUNTER — Other Ambulatory Visit: Payer: Self-pay | Admitting: Women's Health

## 2017-05-09 DIAGNOSIS — O2342 Unspecified infection of urinary tract in pregnancy, second trimester: Secondary | ICD-10-CM

## 2017-05-09 DIAGNOSIS — R350 Frequency of micturition: Secondary | ICD-10-CM

## 2017-05-09 DIAGNOSIS — Z3482 Encounter for supervision of other normal pregnancy, second trimester: Secondary | ICD-10-CM

## 2017-05-09 MED ORDER — NITROFURANTOIN MONOHYD MACRO 100 MG PO CAPS
100.0000 mg | ORAL_CAPSULE | Freq: Two times a day (BID) | ORAL | 0 refills | Status: DC
Start: 2017-05-09 — End: 2017-06-10

## 2017-05-10 ENCOUNTER — Other Ambulatory Visit: Payer: PRIVATE HEALTH INSURANCE

## 2017-05-12 LAB — URINE CULTURE

## 2017-05-13 ENCOUNTER — Other Ambulatory Visit: Payer: Self-pay

## 2017-05-13 ENCOUNTER — Ambulatory Visit (INDEPENDENT_AMBULATORY_CARE_PROVIDER_SITE_OTHER): Payer: PRIVATE HEALTH INSURANCE | Admitting: *Deleted

## 2017-05-13 ENCOUNTER — Encounter: Payer: Self-pay | Admitting: *Deleted

## 2017-05-13 VITALS — BP 116/70 | HR 82 | Ht 63.0 in | Wt 155.0 lb

## 2017-05-13 DIAGNOSIS — Z1389 Encounter for screening for other disorder: Secondary | ICD-10-CM | POA: Diagnosis not present

## 2017-05-13 DIAGNOSIS — O09219 Supervision of pregnancy with history of pre-term labor, unspecified trimester: Secondary | ICD-10-CM | POA: Diagnosis not present

## 2017-05-13 DIAGNOSIS — Z3A16 16 weeks gestation of pregnancy: Secondary | ICD-10-CM | POA: Diagnosis not present

## 2017-05-13 DIAGNOSIS — O09899 Supervision of other high risk pregnancies, unspecified trimester: Secondary | ICD-10-CM

## 2017-05-13 DIAGNOSIS — Z331 Pregnant state, incidental: Secondary | ICD-10-CM | POA: Diagnosis not present

## 2017-05-13 LAB — POCT URINALYSIS DIPSTICK
Blood, UA: NEGATIVE
Glucose, UA: NEGATIVE
KETONES UA: NEGATIVE
LEUKOCYTES UA: NEGATIVE
NITRITE UA: NEGATIVE
PROTEIN UA: NEGATIVE

## 2017-05-13 NOTE — Progress Notes (Signed)
Pt given Makena 275mg  SQ right arm without complications. Advised to return in 1 week for next injection.

## 2017-05-14 ENCOUNTER — Telehealth: Payer: Self-pay | Admitting: *Deleted

## 2017-05-14 NOTE — Telephone Encounter (Signed)
Makena to be shipped on 2/21.

## 2017-05-20 ENCOUNTER — Encounter: Payer: Self-pay | Admitting: Women's Health

## 2017-05-20 ENCOUNTER — Encounter: Payer: PRIVATE HEALTH INSURANCE | Admitting: Women's Health

## 2017-05-20 ENCOUNTER — Ambulatory Visit (INDEPENDENT_AMBULATORY_CARE_PROVIDER_SITE_OTHER): Payer: PRIVATE HEALTH INSURANCE

## 2017-05-20 ENCOUNTER — Ambulatory Visit (INDEPENDENT_AMBULATORY_CARE_PROVIDER_SITE_OTHER): Payer: PRIVATE HEALTH INSURANCE | Admitting: Women's Health

## 2017-05-20 ENCOUNTER — Other Ambulatory Visit: Payer: PRIVATE HEALTH INSURANCE

## 2017-05-20 VITALS — BP 100/58 | HR 96 | Wt 156.0 lb

## 2017-05-20 DIAGNOSIS — Z3A18 18 weeks gestation of pregnancy: Secondary | ICD-10-CM | POA: Diagnosis not present

## 2017-05-20 DIAGNOSIS — O09219 Supervision of pregnancy with history of pre-term labor, unspecified trimester: Secondary | ICD-10-CM

## 2017-05-20 DIAGNOSIS — O09212 Supervision of pregnancy with history of pre-term labor, second trimester: Secondary | ICD-10-CM | POA: Diagnosis not present

## 2017-05-20 DIAGNOSIS — O09899 Supervision of other high risk pregnancies, unspecified trimester: Secondary | ICD-10-CM

## 2017-05-20 DIAGNOSIS — Z3482 Encounter for supervision of other normal pregnancy, second trimester: Secondary | ICD-10-CM

## 2017-05-20 DIAGNOSIS — Z363 Encounter for antenatal screening for malformations: Secondary | ICD-10-CM | POA: Diagnosis not present

## 2017-05-20 DIAGNOSIS — Z331 Pregnant state, incidental: Secondary | ICD-10-CM

## 2017-05-20 DIAGNOSIS — M5430 Sciatica, unspecified side: Secondary | ICD-10-CM

## 2017-05-20 DIAGNOSIS — N390 Urinary tract infection, site not specified: Secondary | ICD-10-CM

## 2017-05-20 DIAGNOSIS — Z1389 Encounter for screening for other disorder: Secondary | ICD-10-CM

## 2017-05-20 DIAGNOSIS — O9989 Other specified diseases and conditions complicating pregnancy, childbirth and the puerperium: Secondary | ICD-10-CM

## 2017-05-20 DIAGNOSIS — O2342 Unspecified infection of urinary tract in pregnancy, second trimester: Secondary | ICD-10-CM

## 2017-05-20 LAB — POCT URINALYSIS DIPSTICK
Blood, UA: NEGATIVE
GLUCOSE UA: NEGATIVE
Ketones, UA: NEGATIVE
Leukocytes, UA: NEGATIVE
Nitrite, UA: NEGATIVE
Protein, UA: NEGATIVE

## 2017-05-20 NOTE — Progress Notes (Signed)
[place] 01+4 wks,cephalic,anterior pl gr 0,normal ovaries bilat,cx 4.5 cm,svp of fluid 4.2 cm,fhr 152 bpm,efw 227 g, 37%,anatomy complete,no obvious abnormalities

## 2017-05-20 NOTE — Progress Notes (Signed)
   LOW-RISK PREGNANCY VISIT Patient name: Emily Chan MRN [id number]  Date of birth: [birth year] Chief Complaint:   Routine Prenatal Visit  History of Present Illness:   Emily Chan is a 37 y.o. [id number] female at [redacted]w[redacted]d with an Estimated Date of Delivery: 10/19/17 being seen today for ongoing management of a low-risk pregnancy.  Today she reports some pressure, thought she had a UTI- she was treated, took all meds, urine cx was neg, still having pressure. Still having sciatica pain.   . Vag. Bleeding: None.  Movement: Absent. denies leaking of fluid. Review of Systems:   Pertinent items are noted in HPI Denies abnormal vaginal discharge w/ itching/odor/irritation, headaches, visual changes, shortness of breath, chest pain, abdominal pain, severe nausea/vomiting, or problems with urination or bowel movements unless otherwise stated above. Pertinent History Reviewed:  Reviewed past medical,surgical, social, obstetrical and family history.  Reviewed problem list, medications and allergies. Physical Assessment:   Vitals:   05/20/17 0923  BP: (!) 100/58  Pulse: 96  Weight: 156 lb (70.8 kg)  Body mass index is 27.63 kg/m.        Physical Examination:   General appearance: Well appearing, and in no distress  Mental status: Alert, oriented to person, place, and time  Skin: Warm & dry  Cardiovascular: Normal heart rate noted  Respiratory: Normal respiratory effort, no distress  Abdomen: Soft, gravid, nontender  Pelvic: Cervical exam deferred         Extremities: Edema: None  Fetal Status: Fetal Heart Rate (bpm): 152 u/s   Movement: Absent    [place] 54+0 wks,cephalic,anterior pl gr 0,normal ovaries bilat,cx 4.5 cm,svp of fluid 4.2 cm,fhr 152 bpm,efw 227 g, 37%,anatomy complete,no obvious abnormalities   Results for orders placed or performed in visit on 05/20/17 (from the past 24 hour(s))  POCT Urinalysis Dipstick   Collection Time: 05/20/17  9:27 AM  Result Value Ref Range   Color, UA      Clarity, UA     Glucose, UA neg    Bilirubin, UA     Ketones, UA neg    Spec Grav, UA  1.010 - 1.025   Blood, UA neg    pH, UA  5.0 - 8.0   Protein, UA neg    Urobilinogen, UA  0.2 or 1.0 E.U./dL   Nitrite, UA neg    Leukocytes, UA Negative Negative   Appearance     Odor      Assessment & Plan:  1) Low-risk pregnancy [id number] at [redacted]w[redacted]d with an Estimated Date of Delivery: 10/19/17   2) Sciatica, gave printed exercises  3) H/O 35wk spont PTB> Makena weekly   Meds: No orders of the defined types were placed in this encounter.  Labs/procedures today: Makena, anatomy u/s  Plan:  Continue routine obstetrical care   Reviewed: Preterm labor symptoms and general obstetric precautions including but not limited to vaginal bleeding, contractions, leaking of fluid and fetal movement were reviewed in detail with the patient.  All questions were answered  Follow-up: Return in about 4 weeks (around 06/17/2017) for LROB, Weekly 17P.  Orders Placed This Encounter  Procedures  . POCT Urinalysis Dipstick   [name] CNM, [facility] 05/20/2017 1:49 PM

## 2017-05-20 NOTE — Patient Instructions (Signed)
Emily Chan, I greatly value your feedback.  If you receive a survey following your visit with [place] today, we appreciate you taking the time to fill it out.  Thanks, Emily Chan, CNM, [facility]  [facility]   Second Trimester of Pregnancy The second trimester is from week 14 through week 27 (months 4 through 6). The second trimester is often a time when you feel your best. Your body has adjusted to being pregnant, and you begin to feel better physically. Usually, morning sickness has lessened or quit completely, you may have more energy, and you may have an increase in appetite. The second trimester is also a time when the fetus is growing rapidly. At the end of the sixth month, the fetus is about 9 inches long and weighs about 1 pounds. You will likely begin to feel the baby move (quickening) between 16 and 20 weeks of pregnancy. Body changes during your second trimester Your body continues to go through many changes during your second trimester. The changes vary from woman to woman.  Your weight will continue to increase. You will notice your lower abdomen bulging out.  You may begin to get stretch marks on your hips, abdomen, and breasts.  You may develop headaches that can be relieved by medicines. The medicines should be approved by your health care provider.  You may urinate more often because the fetus is pressing on your bladder.  You may develop or continue to have heartburn as a result of your pregnancy.  You may develop constipation because certain hormones are causing the muscles that push waste through your intestines to slow down.  You may develop hemorrhoids or swollen, bulging veins (varicose veins).  You may have back pain. This is caused by: ? Weight gain. ? Pregnancy hormones that are relaxing the joints in your pelvis. ? A shift in weight and the muscles that support your balance.  Your breasts will continue to grow and they will continue to become  tender.  Your gums may bleed and may be sensitive to brushing and flossing.  Dark spots or blotches (chloasma, mask of pregnancy) may develop on your face. This will likely fade after the baby is born.  A dark line from your belly button to the pubic area (linea nigra) may appear. This will likely fade after the baby is born.  You may have changes in your hair. These can include thickening of your hair, rapid growth, and changes in texture. Some women also have hair loss during or after pregnancy, or hair that feels dry or thin. Your hair will most likely return to normal after your baby is born.  What to expect at prenatal visits During a routine prenatal visit:  You will be weighed to make sure you and the fetus are growing normally.  Your blood pressure will be taken.  Your abdomen will be measured to track your baby's growth.  The fetal heartbeat will be listened to.  Any test results from the previous visit will be discussed.  Your health care provider may ask you:  How you are feeling.  If you are feeling the baby move.  If you have had any abnormal symptoms, such as leaking fluid, bleeding, severe headaches, or abdominal cramping.  If you are using any tobacco products, including cigarettes, chewing tobacco, and electronic cigarettes.  If you have any questions.  Other tests that may be performed during your second trimester include:  Blood tests that check for: ? Low iron levels (anemia). ?  High blood sugar that affects pregnant women (gestational diabetes) between 1 and 28 weeks. ? Rh antibodies. This is to check for a protein on red blood cells (Rh factor).  Urine tests to check for infections, diabetes, or protein in the urine.  An ultrasound to confirm the proper growth and development of the baby.  An amniocentesis to check for possible genetic problems.  Fetal screens for spina bifida and Down syndrome.  HIV (human immunodeficiency virus) testing.  Routine prenatal testing includes screening for HIV, unless you choose not to have this test.  Follow these instructions at home: Medicines  Follow your health care provider's instructions regarding medicine use. Specific medicines may be either safe or unsafe to take during pregnancy.  Take a prenatal vitamin that contains at least 600 micrograms (mcg) of folic acid.  If you develop constipation, try taking a stool softener if your health care provider approves. Eating and drinking  Eat a balanced diet that includes fresh fruits and vegetables, whole grains, good sources of protein such as meat, eggs, or tofu, and low-fat dairy. Your health care provider will help you determine the amount of weight gain that is right for you.  Avoid raw meat and uncooked cheese. These carry germs that can cause birth defects in the baby.  If you have low calcium intake from food, talk to your health care provider about whether you should take a daily calcium supplement.  Limit foods that are high in fat and processed sugars, such as fried and sweet foods.  To prevent constipation: ? Drink enough fluid to keep your urine clear or pale yellow. ? Eat foods that are high in fiber, such as fresh fruits and vegetables, whole grains, and beans. Activity  Exercise only as directed by your health care provider. Most women can continue their usual exercise routine during pregnancy. Try to exercise for 30 minutes at least 5 days a week. Stop exercising if you experience uterine contractions.  Avoid heavy lifting, wear low heel shoes, and practice good posture.  A sexual relationship may be continued unless your health care provider directs you otherwise. Relieving pain and discomfort  Wear a good support bra to prevent discomfort from breast tenderness.  Take warm sitz baths to soothe any pain or discomfort caused by hemorrhoids. Use hemorrhoid cream if your health care provider approves.  Rest with your  legs elevated if you have leg cramps or low back pain.  If you develop varicose veins, wear support hose. Elevate your feet for 15 minutes, 3-4 times a day. Limit salt in your diet. Prenatal Care  Write down your questions. Take them to your prenatal visits.  Keep all your prenatal visits as told by your health care provider. This is important. Safety  Wear your seat belt at all times when driving.  Make a list of emergency phone numbers, including numbers for family, friends, the hospital, and police and fire departments. General instructions  Ask your health care provider for a referral to a local prenatal education class. Begin classes no later than the beginning of month 6 of your pregnancy.  Ask for help if you have counseling or nutritional needs during pregnancy. Your health care provider can offer advice or refer you to specialists for help with various needs.  Do not use hot tubs, steam rooms, or saunas.  Do not douche or use tampons or scented sanitary pads.  Do not cross your legs for long periods of time.  Avoid cat litter boxes and  soil used by cats. These carry germs that can cause birth defects in the baby and possibly loss of the fetus by miscarriage or stillbirth.  Avoid all smoking, herbs, alcohol, and unprescribed drugs. Chemicals in these products can affect the formation and growth of the baby.  Do not use any products that contain nicotine or tobacco, such as cigarettes and e-cigarettes. If you need help quitting, ask your health care provider.  Visit your dentist if you have not gone yet during your pregnancy. Use a soft toothbrush to brush your teeth and be gentle when you floss. Contact a health care provider if:  You have dizziness.  You have mild pelvic cramps, pelvic pressure, or nagging pain in the abdominal area.  You have persistent nausea, vomiting, or diarrhea.  You have a bad smelling vaginal discharge.  You have pain when you urinate. Get  help right away if:  You have a fever.  You are leaking fluid from your vagina.  You have spotting or bleeding from your vagina.  You have severe abdominal cramping or pain.  You have rapid weight gain or weight loss.  You have shortness of breath with chest pain.  You notice sudden or extreme swelling of your face, hands, ankles, feet, or legs.  You have not felt your baby move in over an hour.  You have severe headaches that do not go away when you take medicine.  You have vision changes. Summary  The second trimester is from week 14 through week 27 (months 4 through 6). It is also a time when the fetus is growing rapidly.  Your body goes through many changes during pregnancy. The changes vary from woman to woman.  Avoid all smoking, herbs, alcohol, and unprescribed drugs. These chemicals affect the formation and growth your baby.  Do not use any tobacco products, such as cigarettes, chewing tobacco, and e-cigarettes. If you need help quitting, ask your health care provider.  Contact your health care provider if you have any questions. Keep all prenatal visits as told by your health care provider. This is important. This information is not intended to replace advice given to you by your health care provider. Make sure you discuss any questions you have with your health care provider. Document Released: 03/06/2001 Document Revised: 08/18/2015 Document Reviewed: 05/13/2012 Elsevier Interactive Patient Education  2017 [place].   Sciatica Rehab Ask your health care provider which exercises are safe for you. Do exercises exactly as told by your health care provider and adjust them as directed. It is normal to feel mild stretching, pulling, tightness, or discomfort as you do these exercises, but you should stop right away if you feel sudden pain or your pain gets worse.Do not begin these exercises until told by your health care provider. Stretching and range of motion  exercises These exercises warm up your muscles and joints and improve the movement and flexibility of your hips and your back. These exercises also help to relieve pain, numbness, and tingling. Exercise A: Sciatic nerve glide 1. Sit in a chair with your head facing down toward your chest. Place your hands behind your back. Let your shoulders slump forward. 2. Slowly straighten one of your knees while you tilt your head back as if you are looking toward the ceiling. Only straighten your leg as far as you can without making your symptoms worse. 3. Hold for __________ seconds. 4. Slowly return to the starting position. 5. Repeat with your other leg. Repeat __________ times. Complete  this exercise __________ times a day. Exercise B: Knee to chest with hip adduction and internal rotation  1. Lie on your back on a firm surface with both legs straight. 2. Bend one of your knees and move it up toward your chest until you feel a gentle stretch in your lower back and buttock. Then, move your knee toward the shoulder that is on the opposite side from your leg. ? Hold your leg in this position by holding onto the front of your knee. 3. Hold for __________ seconds. 4. Slowly return to the starting position. 5. Repeat with your other leg. Repeat __________ times. Complete this exercise __________ times a day. Exercise C: Prone extension on elbows  1. Lie on your abdomen on a firm surface. A bed may be too soft for this exercise. 2. Prop yourself up on your elbows. 3. Use your arms to help lift your chest up until you feel a gentle stretch in your abdomen and your lower back. ? This will place some of your body weight on your elbows. If this is uncomfortable, try stacking pillows under your chest. ? Your hips should stay down, against the surface that you are lying on. Keep your hip and back muscles relaxed. 4. Hold for __________ seconds. 5. Slowly relax your upper body and return to the starting  position. Repeat __________ times. Complete this exercise __________ times a day. Strengthening exercises These exercises build strength and endurance in your back. Endurance is the ability to use your muscles for a long time, even after they get tired. Exercise D: Pelvic tilt 1. Lie on your back on a firm surface. Bend your knees and keep your feet flat. 2. Tense your abdominal muscles. Tip your pelvis up toward the ceiling and flatten your lower back into the floor. ? To help with this exercise, you may place a small towel under your lower back and try to push your back into the towel. 3. Hold for __________ seconds. 4. Let your muscles relax completely before you repeat this exercise. Repeat __________ times. Complete this exercise __________ times a day. Exercise E: Alternating arm and leg raises  1. Get on your hands and knees on a firm surface. If you are on a hard floor, you may want to use padding to cushion your knees, such as an exercise mat. 2. Line up your arms and legs. Your hands should be below your shoulders, and your knees should be below your hips. 3. Lift your left leg behind you. At the same time, raise your right arm and straighten it in front of you. ? Do not lift your leg higher than your hip. ? Do not lift your arm higher than your shoulder. ? Keep your abdominal and back muscles tight. ? Keep your hips facing the ground. ? Do not arch your back. ? Keep your balance carefully, and do not hold your breath. 4. Hold for __________ seconds. 5. Slowly return to the starting position and repeat with your right leg and your left arm. Repeat __________ times. Complete this exercise __________ times a day. Posture and body mechanics  Body mechanics refers to the movements and positions of your body while you do your daily activities. Posture is part of body mechanics. Good posture and healthy body mechanics can help to relieve stress in your body's tissues and joints. Good  posture means that your spine is in its natural S-curve position (your spine is neutral), your shoulders are pulled back slightly, and your  head is not tipped forward. The following are general guidelines for applying improved posture and body mechanics to your everyday activities. Standing   When standing, keep your spine neutral and your feet about hip-width apart. Keep a slight bend in your knees. Your ears, shoulders, and hips should line up.  When you do a task in which you stand in one place for a long time, place one foot up on a stable object that is 2-4 inches (5-10 cm) high, such as a footstool. This helps keep your spine neutral. Sitting   When sitting, keep your spine neutral and keep your feet flat on the floor. Use a footrest, if necessary, and keep your thighs parallel to the floor. Avoid rounding your shoulders, and avoid tilting your head forward.  When working at a desk or a computer, keep your desk at a height where your hands are slightly lower than your elbows. Slide your chair under your desk so you are close enough to maintain good posture.  When working at a computer, place your monitor at a height where you are looking straight ahead and you do not have to tilt your head forward or downward to look at the screen. Resting   When lying down and resting, avoid positions that are most painful for you.  If you have pain with activities such as sitting, bending, stooping, or squatting (flexion-based activities), lie in a position in which your body does not bend very much. For example, avoid curling up on your side with your arms and knees near your chest (fetal position).  If you have pain with activities such as standing for a long time or reaching with your arms (extension-based activities), lie with your spine in a neutral position and bend your knees slightly. Try the following positions: ? Lying on your side with a pillow between your knees. ? Lying on your back with  a pillow under your knees. Lifting   When lifting objects, keep your feet at least shoulder-width apart and tighten your abdominal muscles.  Bend your knees and hips and keep your spine neutral. It is important to lift using the strength of your legs, not your back. Do not lock your knees straight out.  Always ask for help to lift heavy or awkward objects. This information is not intended to replace advice given to you by your health care provider. Make sure you discuss any questions you have with your health care provider. Document Released: 03/12/2005 Document Revised: 11/17/2015 Document Reviewed: 11/26/2014 Elsevier Interactive Patient Education  [employer].

## 2017-05-27 ENCOUNTER — Ambulatory Visit (INDEPENDENT_AMBULATORY_CARE_PROVIDER_SITE_OTHER): Payer: PRIVATE HEALTH INSURANCE | Admitting: *Deleted

## 2017-05-27 VITALS — BP 104/60

## 2017-05-27 DIAGNOSIS — Z3A19 19 weeks gestation of pregnancy: Secondary | ICD-10-CM | POA: Diagnosis not present

## 2017-05-27 DIAGNOSIS — Z331 Pregnant state, incidental: Secondary | ICD-10-CM

## 2017-05-27 DIAGNOSIS — O09212 Supervision of pregnancy with history of pre-term labor, second trimester: Secondary | ICD-10-CM | POA: Diagnosis not present

## 2017-05-27 DIAGNOSIS — Z1389 Encounter for screening for other disorder: Secondary | ICD-10-CM

## 2017-05-27 LAB — POCT URINALYSIS DIPSTICK
GLUCOSE UA: NEGATIVE
Ketones, UA: NEGATIVE
LEUKOCYTES UA: NEGATIVE
Nitrite, UA: NEGATIVE
Protein, UA: NEGATIVE
RBC UA: NEGATIVE

## 2017-05-27 NOTE — Progress Notes (Signed)
Pt in for Makena injection. Given in right arm. Patient tolerated well. No problems or concerns today.

## 2017-05-29 ENCOUNTER — Encounter: Payer: Self-pay | Admitting: *Deleted

## 2017-06-03 ENCOUNTER — Encounter: Payer: Self-pay | Admitting: *Deleted

## 2017-06-03 ENCOUNTER — Ambulatory Visit (INDEPENDENT_AMBULATORY_CARE_PROVIDER_SITE_OTHER): Payer: PRIVATE HEALTH INSURANCE | Admitting: *Deleted

## 2017-06-03 VITALS — BP 106/60 | HR 80

## 2017-06-03 DIAGNOSIS — Z331 Pregnant state, incidental: Secondary | ICD-10-CM

## 2017-06-03 DIAGNOSIS — O09212 Supervision of pregnancy with history of pre-term labor, second trimester: Secondary | ICD-10-CM | POA: Diagnosis not present

## 2017-06-03 DIAGNOSIS — Z3A20 20 weeks gestation of pregnancy: Secondary | ICD-10-CM | POA: Diagnosis not present

## 2017-06-03 DIAGNOSIS — Z1389 Encounter for screening for other disorder: Secondary | ICD-10-CM

## 2017-06-03 LAB — POCT URINALYSIS DIPSTICK
Blood, UA: NEGATIVE
GLUCOSE UA: NEGATIVE
Ketones, UA: NEGATIVE
LEUKOCYTES UA: NEGATIVE
NITRITE UA: NEGATIVE
PROTEIN UA: NEGATIVE

## 2017-06-03 NOTE — Progress Notes (Signed)
Pt in for makena injection. Given in left arm. Patient tolerated well. Baby is moving well, no problems or concerns.

## 2017-06-04 ENCOUNTER — Telehealth: Payer: Self-pay | Admitting: *Deleted

## 2017-06-04 ENCOUNTER — Encounter: Payer: PRIVATE HEALTH INSURANCE | Admitting: Advanced Practice Midwife

## 2017-06-04 NOTE — Telephone Encounter (Signed)
Pt called and stated that she has an elevated blood pressure of 140/80. She has a headache, is dizzy and seeing spots. Discussed patient with Anderson Malta and she recommends that patient see Manus Gunning or Dr. Elonda Husky today. Levada Dy to schedule patient.

## 2017-06-04 NOTE — Telephone Encounter (Signed)
Previous note entered in error. Pt not having high bp etc.

## 2017-06-10 ENCOUNTER — Encounter: Payer: Self-pay | Admitting: *Deleted

## 2017-06-10 ENCOUNTER — Ambulatory Visit (INDEPENDENT_AMBULATORY_CARE_PROVIDER_SITE_OTHER): Payer: PRIVATE HEALTH INSURANCE | Admitting: *Deleted

## 2017-06-10 DIAGNOSIS — O09212 Supervision of pregnancy with history of pre-term labor, second trimester: Secondary | ICD-10-CM

## 2017-06-10 DIAGNOSIS — O09219 Supervision of pregnancy with history of pre-term labor, unspecified trimester: Principal | ICD-10-CM

## 2017-06-10 DIAGNOSIS — Z3A21 21 weeks gestation of pregnancy: Secondary | ICD-10-CM | POA: Diagnosis not present

## 2017-06-10 DIAGNOSIS — O09899 Supervision of other high risk pregnancies, unspecified trimester: Secondary | ICD-10-CM

## 2017-06-10 NOTE — Progress Notes (Signed)
Patient came in for 17p injection. She had a change in insurance beginning March 1 in which she is now being charged $25 for every visit including 17p injection visits. Mother in law is a retired Marine scientist who is with the patient today to be educated on giving the injection. She currently gives injections to her husband and was recently managing his PICC line. 17P demo shown to her. Educated mother in law on cleaning the area to be injected, angle of giving the injection, proper site and how the injection works. Verbalized understanding with no questions. 17P given in right arm with no complications. Patient will received next injection in our office and then will start getting them at home the following week.

## 2017-06-17 ENCOUNTER — Encounter: Payer: Self-pay | Admitting: Women's Health

## 2017-06-17 ENCOUNTER — Other Ambulatory Visit: Payer: Self-pay

## 2017-06-17 ENCOUNTER — Ambulatory Visit (INDEPENDENT_AMBULATORY_CARE_PROVIDER_SITE_OTHER): Payer: PRIVATE HEALTH INSURANCE | Admitting: Women's Health

## 2017-06-17 VITALS — BP 102/60 | HR 79 | Wt 166.0 lb

## 2017-06-17 DIAGNOSIS — O9989 Other specified diseases and conditions complicating pregnancy, childbirth and the puerperium: Secondary | ICD-10-CM

## 2017-06-17 DIAGNOSIS — Z331 Pregnant state, incidental: Secondary | ICD-10-CM

## 2017-06-17 DIAGNOSIS — O09212 Supervision of pregnancy with history of pre-term labor, second trimester: Secondary | ICD-10-CM

## 2017-06-17 DIAGNOSIS — O09219 Supervision of pregnancy with history of pre-term labor, unspecified trimester: Secondary | ICD-10-CM

## 2017-06-17 DIAGNOSIS — Z1389 Encounter for screening for other disorder: Secondary | ICD-10-CM

## 2017-06-17 DIAGNOSIS — R51 Headache: Secondary | ICD-10-CM

## 2017-06-17 DIAGNOSIS — O09899 Supervision of other high risk pregnancies, unspecified trimester: Secondary | ICD-10-CM

## 2017-06-17 DIAGNOSIS — T881XXA Other complications following immunization, not elsewhere classified, initial encounter: Secondary | ICD-10-CM

## 2017-06-17 DIAGNOSIS — Z3A22 22 weeks gestation of pregnancy: Secondary | ICD-10-CM

## 2017-06-17 DIAGNOSIS — Z3482 Encounter for supervision of other normal pregnancy, second trimester: Secondary | ICD-10-CM

## 2017-06-17 LAB — POCT URINALYSIS DIPSTICK
Blood, UA: NEGATIVE
Glucose, UA: NEGATIVE
Ketones, UA: NEGATIVE
Nitrite, UA: NEGATIVE
Protein, UA: NEGATIVE

## 2017-06-17 NOTE — Progress Notes (Signed)
PT given Makena 275mg  SQ left arm without complications. Pt to receive next injection in 1 week

## 2017-06-17 NOTE — Progress Notes (Signed)
LOW-RISK PREGNANCY VISIT Patient name: Emily Chan MRN [id number]  Date of birth: 06/24/1980 Chief Complaint:   Routine Prenatal Visit (17P)  History of Present Illness:   Emily Chan is a 37 y.o. [id number] female at [redacted]w[redacted]d with an Estimated Date of Delivery: 10/19/17 being seen today for ongoing management of a low-risk pregnancy.  Today she reports itchy rash on bilateral upper arms around Makena injection sites- hasn't tried anything to help yet. Has been waking up in the middle of the night with frontal headaches that go away app 51mins after standing up. Occ has to take apap. Denies sinus/allergy issues.  . Vag. Bleeding: None.  Movement: Absent. denies leaking of fluid. Review of Systems:   Pertinent items are noted in HPI Denies abnormal vaginal discharge w/ itching/odor/irritation, headaches, visual changes, shortness of breath, chest pain, abdominal pain, severe nausea/vomiting, or problems with urination or bowel movements unless otherwise stated above. Pertinent History Reviewed:  Reviewed past medical,surgical, social, obstetrical and family history.  Reviewed problem list, medications and allergies. Physical Assessment:   Vitals:   06/17/17 0924  BP: 102/60  Pulse: 79  Weight: 166 lb (75.3 kg)  Body mass index is 29.41 kg/m.        Physical Examination:   General appearance: Well appearing, and in no distress  Mental status: Alert, oriented to person, place, and time  Skin: Warm & dry, fine rash around injection sites bilateraly  Cardiovascular: Normal heart rate noted  Respiratory: Normal respiratory effort, no distress  Abdomen: Soft, gravid, nontender  Pelvic: Cervical exam deferred         Extremities: Edema: None  Fetal Status: Fetal Heart Rate (bpm): 145   Movement: Absent    Results for orders placed or performed in visit on 06/17/17 (from the past 24 hour(s))  POCT urinalysis dipstick   Collection Time: 06/17/17  9:28 AM  Result Value Ref Range   Color, UA     Clarity, UA     Glucose, UA neg    Bilirubin, UA     Ketones, UA neg    Spec Grav, UA  1.010 - 1.025   Blood, UA neg    pH, UA  5.0 - 8.0   Protein, UA neg    Urobilinogen, UA  0.2 or 1.0 E.U./dL   Nitrite, UA neg    Leukocytes, UA Moderate (2+) (A) Negative   Appearance     Odor      Assessment & Plan:  1) Low-risk pregnancy [id number] at [redacted]w[redacted]d with an Estimated Date of Delivery: 10/19/17   2) H/O 35wk PTB, continue weekly Makena, got dose today, is going to have MIL who is a Marine scientist start doing at home b/c her insurance co has started charging her $25/visit  3) Itchy rash around [facility] injection sites> try hydrocortisone cream, let us know if not helping  4) Nightly frontal headaches> that resolve w/ standing, discussed w/ LHE, to try inclined pillow, can also try claritin/zyrtec, if not helping let us know and will consider MRI   Meds: No orders of the defined types were placed in this encounter.  Labs/procedures today: Makena  Plan:  Continue routine obstetrical care   Reviewed: Preterm labor symptoms and general obstetric precautions including but not limited to vaginal bleeding, contractions, leaking of fluid and fetal movement were reviewed in detail with the patient.  All questions were answered  Follow-up: Return in about 1 month (around 07/15/2017) for LROB, PN2,.  Orders Placed This Encounter  Procedures  .  POCT urinalysis dipstick   [name] CNM, [facility] 06/17/2017 10:12 AM

## 2017-06-17 NOTE — Patient Instructions (Signed)
Emily Chan, I greatly value your feedback.  If you receive a survey following your visit with [place] today, we appreciate you taking the time to fill it out.  Thanks, Knute Neu, CNM, WHNP-BC   You will have your sugar test next visit.  Please do not eat or drink anything after midnight the night before you come, not even water.  You will be here for at least two hours.     Call the office [phone]) or go to [facility] if:  You begin to have strong, frequent contractions  Your water breaks.  Sometimes it is a big gush of fluid, sometimes it is just a trickle that keeps getting your panties wet or running down your legs  You have vaginal bleeding.  It is normal to have a small amount of spotting if your cervix was checked.   You don't feel your baby moving like normal.  If you don't, get you something to eat and drink and lay down and focus on feeling your baby move.   If your baby is still not moving like normal, you should call the office or go to [place] [facility] Pregnancy The second trimester is from week 13 through week 28, months 4 through 6. The second trimester is often a time when you feel your best. Your body has also adjusted to being pregnant, and you begin to feel better physically. Usually, morning sickness has lessened or quit completely, you may have more energy, and you may have an increase in appetite. The second trimester is also a time when the fetus is growing rapidly. At the end of the sixth month, the fetus is about 9 inches long and weighs about 1 pounds. You will likely begin to feel the baby move (quickening) between 18 and 20 weeks of the pregnancy. BODY CHANGES Your body goes through many changes during pregnancy. The changes vary from woman to woman.   Your weight will continue to increase. You will notice your lower abdomen bulging out.  You may begin to get stretch marks on your hips, abdomen, and breasts.  You may develop headaches  that can be relieved by medicines approved by your health care provider.  You may urinate more often because the fetus is pressing on your bladder.  You may develop or continue to have heartburn as a result of your pregnancy.  You may develop constipation because certain hormones are causing the muscles that push waste through your intestines to slow down.  You may develop hemorrhoids or swollen, bulging veins (varicose veins).  You may have back pain because of the weight gain and pregnancy hormones relaxing your joints between the bones in your pelvis and as a result of a shift in weight and the muscles that support your balance.  Your breasts will continue to grow and be tender.  Your gums may bleed and may be sensitive to brushing and flossing.  Dark spots or blotches (chloasma, mask of pregnancy) may develop on your face. This will likely fade after the baby is born.  A dark line from your belly button to the pubic area (linea nigra) may appear. This will likely fade after the baby is born.  You may have changes in your hair. These can include thickening of your hair, rapid growth, and changes in texture. Some women also have hair loss during or after pregnancy, or hair that feels dry or thin. Your hair will most likely return to normal after your baby  is born. WHAT TO EXPECT AT YOUR PRENATAL VISITS During a routine prenatal visit:  You will be weighed to make sure you and the fetus are growing normally.  Your blood pressure will be taken.  Your abdomen will be measured to track your baby's growth.  The fetal heartbeat will be listened to.  Any test results from the previous visit will be discussed. Your health care provider may ask you:  How you are feeling.  If you are feeling the baby move.  If you have had any abnormal symptoms, such as leaking fluid, bleeding, severe headaches, or abdominal cramping.  If you have any questions. Other tests that may be performed  during your second trimester include:  Blood tests that check for:  Low iron levels (anemia).  Gestational diabetes (between 24 and 28 weeks).  Rh antibodies.  Urine tests to check for infections, diabetes, or protein in the urine.  An ultrasound to confirm the proper growth and development of the baby.  An amniocentesis to check for possible genetic problems.  Fetal screens for spina bifida and Down syndrome. HOME CARE INSTRUCTIONS   Avoid all smoking, herbs, alcohol, and unprescribed drugs. These chemicals affect the formation and growth of the baby.  Follow your health care provider's instructions regarding medicine use. There are medicines that are either safe or unsafe to take during pregnancy.  Exercise only as directed by your health care provider. Experiencing uterine cramps is a good sign to stop exercising.  Continue to eat regular, healthy meals.  Wear a good support bra for breast tenderness.  Do not use hot tubs, steam rooms, or saunas.  Wear your seat belt at all times when driving.  Avoid raw meat, uncooked cheese, cat litter boxes, and soil used by cats. These carry germs that can cause birth defects in the baby.  Take your prenatal vitamins.  Try taking a stool softener (if your health care provider approves) if you develop constipation. Eat more high-fiber foods, such as fresh vegetables or fruit and whole grains. Drink plenty of fluids to keep your urine clear or pale yellow.  Take warm sitz baths to soothe any pain or discomfort caused by hemorrhoids. Use hemorrhoid cream if your health care provider approves.  If you develop varicose veins, wear support hose. Elevate your feet for 15 minutes, 3-4 times a day. Limit salt in your diet.  Avoid heavy lifting, wear low heel shoes, and practice good posture.  Rest with your legs elevated if you have leg cramps or low back pain.  Visit your dentist if you have not gone yet during your pregnancy. Use a soft  toothbrush to brush your teeth and be gentle when you floss.  A sexual relationship may be continued unless your health care provider directs you otherwise.  Continue to go to all your prenatal visits as directed by your health care provider. SEEK MEDICAL CARE IF:   You have dizziness.  You have mild pelvic cramps, pelvic pressure, or nagging pain in the abdominal area.  You have persistent nausea, vomiting, or diarrhea.  You have a bad smelling vaginal discharge.  You have pain with urination. SEEK IMMEDIATE MEDICAL CARE IF:   You have a fever.  You are leaking fluid from your vagina.  You have spotting or bleeding from your vagina.  You have severe abdominal cramping or pain.  You have rapid weight gain or loss.  You have shortness of breath with chest pain.  You notice sudden or extreme swelling  of your face, hands, ankles, feet, or legs.  You have not felt your baby move in over an hour.  You have severe headaches that do not go away with medicine.  You have vision changes. Document Released: 03/06/2001 Document Revised: 03/17/2013 Document Reviewed: 05/13/2012 [facility] Patient Information 2015 [place], [place]. This information is not intended to replace advice given to you by your health care provider. Make sure you discuss any questions you have with your health care provider.

## 2017-07-03 ENCOUNTER — Encounter: Payer: Self-pay | Admitting: Physician Assistant

## 2017-07-15 ENCOUNTER — Other Ambulatory Visit: Payer: PRIVATE HEALTH INSURANCE

## 2017-07-15 ENCOUNTER — Encounter: Payer: Self-pay | Admitting: Women's Health

## 2017-07-15 ENCOUNTER — Ambulatory Visit (INDEPENDENT_AMBULATORY_CARE_PROVIDER_SITE_OTHER): Payer: PRIVATE HEALTH INSURANCE | Admitting: Women's Health

## 2017-07-15 VITALS — BP 110/60 | HR 94 | Wt 171.0 lb

## 2017-07-15 DIAGNOSIS — Z3A26 26 weeks gestation of pregnancy: Secondary | ICD-10-CM

## 2017-07-15 DIAGNOSIS — Z131 Encounter for screening for diabetes mellitus: Secondary | ICD-10-CM

## 2017-07-15 DIAGNOSIS — O09892 Supervision of other high risk pregnancies, second trimester: Secondary | ICD-10-CM

## 2017-07-15 DIAGNOSIS — O2242 Hemorrhoids in pregnancy, second trimester: Secondary | ICD-10-CM

## 2017-07-15 DIAGNOSIS — R197 Diarrhea, unspecified: Secondary | ICD-10-CM

## 2017-07-15 DIAGNOSIS — O2202 Varicose veins of lower extremity in pregnancy, second trimester: Secondary | ICD-10-CM

## 2017-07-15 DIAGNOSIS — O09212 Supervision of pregnancy with history of pre-term labor, second trimester: Secondary | ICD-10-CM

## 2017-07-15 DIAGNOSIS — O9989 Other specified diseases and conditions complicating pregnancy, childbirth and the puerperium: Secondary | ICD-10-CM

## 2017-07-15 DIAGNOSIS — Z3482 Encounter for supervision of other normal pregnancy, second trimester: Secondary | ICD-10-CM

## 2017-07-15 DIAGNOSIS — R252 Cramp and spasm: Secondary | ICD-10-CM

## 2017-07-15 DIAGNOSIS — O99612 Diseases of the digestive system complicating pregnancy, second trimester: Secondary | ICD-10-CM

## 2017-07-15 DIAGNOSIS — Z1389 Encounter for screening for other disorder: Secondary | ICD-10-CM

## 2017-07-15 DIAGNOSIS — Z331 Pregnant state, incidental: Secondary | ICD-10-CM

## 2017-07-15 LAB — POCT URINALYSIS DIPSTICK
Blood, UA: NEGATIVE
GLUCOSE UA: NEGATIVE
KETONES UA: NEGATIVE
Leukocytes, UA: NEGATIVE
NITRITE UA: NEGATIVE

## 2017-07-15 MED ORDER — HYDROXYPROGESTERONE CAPROATE 275 MG/1.1ML ~~LOC~~ SOAJ
275.0000 mg | Freq: Once | SUBCUTANEOUS | Status: AC
  Administered 2017-07-15: 275 mg via SUBCUTANEOUS

## 2017-07-15 MED ORDER — HYDROCORTISONE 2.5 % RE CREA: 1.0000 "application " | TOPICAL_CREAM | Freq: Two times a day (BID) | RECTAL | 0 refills | Status: DC

## 2017-07-15 NOTE — Patient Instructions (Addendum)
Emily Chan, I greatly value your feedback.  If you receive a survey following your visit with [place] today, we appreciate you taking the time to fill it out.  Thanks, Emily Chan, CNM, WHNP-BC  Begin taking a 81mg  baby aspirin daily   GI probiotics for diarrhea  Tips to Help Leg Cramps  Increase dietary sources of calcium (milk, yogurt, cheese, leafy greens, seafood, legumes, and fruit) and magnesium (dark leafy greens, nuts, seeds, fish, beans, whole grains, avocados, yogurt, bananas, dried fruit, dark chocolate)  Spoonful of regular yellow mustard every night  Pickle juice  Magnesium supplement: 1mmol in the morning, 33mmol at night (can find in the vitamin aisle)  Dorsiflexion of foot: pointing your toes back towards your knee during the cramp       Call the office [phone]) or go to [facility] if:  You begin to have strong, frequent contractions  Your water breaks.  Sometimes it is a big gush of fluid, sometimes it is just a trickle that keeps getting your panties wet or running down your legs  You have vaginal bleeding.  It is normal to have a small amount of spotting if your cervix was checked.   You don't feel your baby moving like normal.  If you don't, get you something to eat and drink and lay down and focus on feeling your baby move.  You should feel at least 10 movements in 2 hours.  If you don't, you should call the office or go to [facility].    Tdap Vaccine  It is recommended that you get the Tdap vaccine during the third trimester of EACH pregnancy to help protect your baby from getting pertussis (whooping cough)  27-36 weeks is the BEST time to do this so that you can pass the protection on to your baby. During pregnancy is better than after pregnancy, but if you are unable to get it during pregnancy it will be offered at the hospital.   You can get this vaccine at the health department or your family doctor  Everyone who will be around your baby  should also be up-to-date on their vaccines. Adults (who are not pregnant) only need 1 dose of Tdap during adulthood.   Third Trimester of Pregnancy The third trimester is from week 29 through week 42, months 7 through 9. The third trimester is a time when the fetus is growing rapidly. At the end of the ninth month, the fetus is about 20 inches in length and weighs 6-10 pounds.  BODY CHANGES Your body goes through many changes during pregnancy. The changes vary from woman to woman.   Your weight will continue to increase. You can expect to gain 25-35 pounds (11-16 kg) by the end of the pregnancy.  You may begin to get stretch marks on your hips, abdomen, and breasts.  You may urinate more often because the fetus is moving lower into your pelvis and pressing on your bladder.  You may develop or continue to have heartburn as a result of your pregnancy.  You may develop constipation because certain hormones are causing the muscles that push waste through your intestines to slow down.  You may develop hemorrhoids or swollen, bulging veins (varicose veins).  You may have pelvic pain because of the weight gain and pregnancy hormones relaxing your joints between the bones in your pelvis. Backaches may result from overexertion of the muscles supporting your posture.  You may have changes in your hair. These can include thickening  of your hair, rapid growth, and changes in texture. Some women also have hair loss during or after pregnancy, or hair that feels dry or thin. Your hair will most likely return to normal after your baby is born.  Your breasts will continue to grow and be tender. A yellow discharge may leak from your breasts called colostrum.  Your belly button may stick out.  You may feel short of breath because of your expanding uterus.  You may notice the fetus "dropping," or moving lower in your abdomen.  You may have a bloody mucus discharge. This usually occurs a few days to a  week before labor begins.  Your cervix becomes thin and soft (effaced) near your due date. WHAT TO EXPECT AT YOUR PRENATAL EXAMS  You will have prenatal exams every 2 weeks until week 36. Then, you will have weekly prenatal exams. During a routine prenatal visit:  You will be weighed to make sure you and the fetus are growing normally.  Your blood pressure is taken.  Your abdomen will be measured to track your baby's growth.  The fetal heartbeat will be listened to.  Any test results from the previous visit will be discussed.  You may have a cervical check near your due date to see if you have effaced. At around 36 weeks, your caregiver will check your cervix. At the same time, your caregiver will also perform a test on the secretions of the vaginal tissue. This test is to determine if a type of bacteria, Group B streptococcus, is present. Your caregiver will explain this further. Your caregiver may ask you:  What your birth plan is.  How you are feeling.  If you are feeling the baby move.  If you have had any abnormal symptoms, such as leaking fluid, bleeding, severe headaches, or abdominal cramping.  If you have any questions. Other tests or screenings that may be performed during your third trimester include:  Blood tests that check for low iron levels (anemia).  Fetal testing to check the health, activity level, and growth of the fetus. Testing is done if you have certain medical conditions or if there are problems during the pregnancy. FALSE LABOR You may feel small, irregular contractions that eventually go away. These are called Braxton Hicks contractions, or false labor. Contractions may last for hours, days, or even weeks before true labor sets in. If contractions come at regular intervals, intensify, or become painful, it is best to be seen by your caregiver.  SIGNS OF LABOR   Menstrual-like cramps.  Contractions that are 5 minutes apart or less.  Contractions that  start on the top of the uterus and spread down to the lower abdomen and back.  A sense of increased pelvic pressure or back pain.  A watery or bloody mucus discharge that comes from the vagina. If you have any of these signs before the 37th week of pregnancy, call your caregiver right away. You need to go to the hospital to get checked immediately. HOME CARE INSTRUCTIONS   Avoid all smoking, herbs, alcohol, and unprescribed drugs. These chemicals affect the formation and growth of the baby.  Follow your caregiver's instructions regarding medicine use. There are medicines that are either safe or unsafe to take during pregnancy.  Exercise only as directed by your caregiver. Experiencing uterine cramps is a good sign to stop exercising.  Continue to eat regular, healthy meals.  Wear a good support bra for breast tenderness.  Do not use hot tubs,  steam rooms, or saunas.  Wear your seat belt at all times when driving.  Avoid raw meat, uncooked cheese, cat litter boxes, and soil used by cats. These carry germs that can cause birth defects in the baby.  Take your prenatal vitamins.  Try taking a stool softener (if your caregiver approves) if you develop constipation. Eat more high-fiber foods, such as fresh vegetables or fruit and whole grains. Drink plenty of fluids to keep your urine clear or pale yellow.  Take warm sitz baths to soothe any pain or discomfort caused by hemorrhoids. Use hemorrhoid cream if your caregiver approves.  If you develop varicose veins, wear support hose. Elevate your feet for 15 minutes, 3-4 times a day. Limit salt in your diet.  Avoid heavy lifting, wear low heal shoes, and practice good posture.  Rest a lot with your legs elevated if you have leg cramps or low back pain.  Visit your dentist if you have not gone during your pregnancy. Use a soft toothbrush to brush your teeth and be gentle when you floss.  A sexual relationship may be continued unless your  caregiver directs you otherwise.  Do not travel far distances unless it is absolutely necessary and only with the approval of your caregiver.  Take prenatal classes to understand, practice, and ask questions about the labor and delivery.  Make a trial run to the hospital.  Pack your hospital bag.  Prepare the baby's nursery.  Continue to go to all your prenatal visits as directed by your caregiver. SEEK MEDICAL CARE IF:  You are unsure if you are in labor or if your water has broken.  You have dizziness.  You have mild pelvic cramps, pelvic pressure, or nagging pain in your abdominal area.  You have persistent nausea, vomiting, or diarrhea.  You have a bad smelling vaginal discharge.  You have pain with urination. SEEK IMMEDIATE MEDICAL CARE IF:   You have a fever.  You are leaking fluid from your vagina.  You have spotting or bleeding from your vagina.  You have severe abdominal cramping or pain.  You have rapid weight loss or gain.  You have shortness of breath with chest pain.  You notice sudden or extreme swelling of your face, hands, ankles, feet, or legs.  You have not felt your baby move in over an hour.  You have severe headaches that do not go away with medicine.  You have vision changes. Document Released: 03/06/2001 Document Revised: 03/17/2013 Document Reviewed: 05/13/2012 [facility] Patient Information 2015 [place], [place]. This information is not intended to replace advice given to you by your health care provider. Make sure you discuss any questions you have with your health care provider.

## 2017-07-15 NOTE — Progress Notes (Signed)
LOW-RISK PREGNANCY VISIT Patient name: Emily Chan MRN [id number]  Date of birth: 11-27-1980 Chief Complaint:   Routine Prenatal Visit (PN2)  History of Present Illness:   Emily Chan is a 37 y.o. [phone] female at [redacted]w[redacted]d with an Estimated Date of Delivery: 10/19/17 being seen today for ongoing management of a low-risk pregnancy.  Today she reports hemorrhoids- preparation H not helping, intermittent diarrhea, varicose veins bilateral legs Lt >Rt not wearing compression hose- too hot/uncomfortable- waits tables, leg cramps- eats a banana daily.  .  .  Movement: Present. denies leaking of fluid. Review of Systems:   Pertinent items are noted in HPI Denies abnormal vaginal discharge w/ itching/odor/irritation, headaches, visual changes, shortness of breath, chest pain, abdominal pain, severe nausea/vomiting, or problems with urination or bowel movements unless otherwise stated above. Pertinent History Reviewed:  Reviewed past medical,surgical, social, obstetrical and family history.  Reviewed problem list, medications and allergies. Physical Assessment:   Vitals:   07/15/17 0941  BP: 110/60  Pulse: 94  Weight: 171 lb (77.6 kg)  Body mass index is 30.29 kg/m.        Physical Examination:   General appearance: Well appearing, and in no distress  Mental status: Alert, oriented to person, place, and time  Skin: Warm & dry  Cardiovascular: Normal heart rate noted  Respiratory: Normal respiratory effort, no distress  Abdomen: Soft, gravid, nontender  Pelvic: Cervical exam deferred         Extremities: Edema: None, large tortuous varicose veins Lt leg, smaller spider veins Rt leg  Fetal Status: Fetal Heart Rate (bpm): 140 Fundal Height: 24 cm Movement: Present    Results for orders placed or performed in visit on 07/15/17 (from the past 24 hour(s))  POCT urinalysis dipstick   Collection Time: 07/15/17  9:44 AM  Result Value Ref Range   Color, UA     Clarity, UA     Glucose, UA  neg    Bilirubin, UA     Ketones, UA neg    Spec Grav, UA  1.010 - 1.025   Blood, UA neg    pH, UA  5.0 - 8.0   Protein, UA trace    Urobilinogen, UA  0.2 or 1.0 E.U./dL   Nitrite, UA neg    Leukocytes, UA Negative Negative   Appearance     Odor      Assessment & Plan:  1) Low-risk pregnancy [id number] at [redacted]w[redacted]d with an Estimated Date of Delivery: 10/19/17   2) Large tortuous varicose veins, discussed w/ LHE, can begin baby ASA to help prevent phlebitis, really needs to wear compression hose, but if won't can wear support hose that go all the way to belly  3) Hemorrhoids> rx anusol HC  4) Intermittent diarrhea> GI probiotics  5) Leg cramps> discussed, can try otc magnesium  6) H/O PTB> continue Makena weekly, got dose today, MIL has been giving at home consistently, brought record w/ her   Meds:  Meds ordered this encounter  Medications  . HYDROXYprogesterone Caproate SOAJ 275 mg  . hydrocortisone (ANUSOL-HC) 2.5 % rectal cream    Sig: Place 1 application rectally 2 (two) times daily.    Dispense:  30 g    Refill:  0    Order Specific Question:   Supervising Provider    Answer:   Tania Ade H [[id number]]   Labs/procedures today: pn2  Plan:  Continue routine obstetrical care   Reviewed: Preterm labor symptoms and general obstetric precautions including but not  limited to vaginal bleeding, contractions, leaking of fluid and fetal movement were reviewed in detail with the patient.  Recommended Tdap at HD/PCP per CDC guidelines. All questions were answered  Follow-up: Return in about 1 month (around 08/12/2017) for [place].  Orders Placed This Encounter  Procedures  . POCT urinalysis dipstick   [name] CNM, [facility] 07/15/2017 10:20 AM

## 2017-07-16 LAB — CBC
HEMATOCRIT: 38.7 % (ref 34.0–46.6)
Hemoglobin: 13.5 g/dL (ref 11.1–15.9)
MCH: 31.3 pg (ref 26.6–33.0)
MCHC: 34.9 g/dL (ref 31.5–35.7)
MCV: 90 fL (ref 79–97)
Platelets: 141 10*3/uL — ABNORMAL LOW (ref 150–379)
RBC: 4.31 x10E6/uL (ref 3.77–5.28)
RDW: 13.6 % (ref 12.3–15.4)
WBC: 6.9 10*3/uL (ref 3.4–10.8)

## 2017-07-16 LAB — HIV ANTIBODY (ROUTINE TESTING W REFLEX): HIV Screen 4th Generation wRfx: NONREACTIVE

## 2017-07-16 LAB — GLUCOSE TOLERANCE, 2 HOURS W/ 1HR
GLUCOSE, 1 HOUR: 110 mg/dL (ref 65–179)
GLUCOSE, 2 HOUR: 95 mg/dL (ref 65–152)
Glucose, Fasting: 79 mg/dL (ref 65–91)

## 2017-07-16 LAB — ANTIBODY SCREEN: ANTIBODY SCREEN: NEGATIVE

## 2017-07-16 LAB — RPR: RPR Ser Ql: NONREACTIVE

## 2017-08-05 ENCOUNTER — Encounter: Payer: Self-pay | Admitting: Women's Health

## 2017-08-05 ENCOUNTER — Ambulatory Visit: Payer: PRIVATE HEALTH INSURANCE

## 2017-08-12 ENCOUNTER — Ambulatory Visit (INDEPENDENT_AMBULATORY_CARE_PROVIDER_SITE_OTHER): Payer: PRIVATE HEALTH INSURANCE | Admitting: Obstetrics and Gynecology

## 2017-08-12 ENCOUNTER — Encounter: Payer: Self-pay | Admitting: Obstetrics and Gynecology

## 2017-08-12 ENCOUNTER — Other Ambulatory Visit: Payer: Self-pay

## 2017-08-12 VITALS — BP 98/54 | HR 89 | Wt 177.0 lb

## 2017-08-12 DIAGNOSIS — O9989 Other specified diseases and conditions complicating pregnancy, childbirth and the puerperium: Secondary | ICD-10-CM | POA: Diagnosis not present

## 2017-08-12 DIAGNOSIS — Z1389 Encounter for screening for other disorder: Secondary | ICD-10-CM

## 2017-08-12 DIAGNOSIS — O09213 Supervision of pregnancy with history of pre-term labor, third trimester: Secondary | ICD-10-CM

## 2017-08-12 DIAGNOSIS — N898 Other specified noninflammatory disorders of vagina: Secondary | ICD-10-CM | POA: Diagnosis not present

## 2017-08-12 DIAGNOSIS — O09899 Supervision of other high risk pregnancies, unspecified trimester: Secondary | ICD-10-CM

## 2017-08-12 DIAGNOSIS — Z3483 Encounter for supervision of other normal pregnancy, third trimester: Secondary | ICD-10-CM

## 2017-08-12 DIAGNOSIS — O09219 Supervision of pregnancy with history of pre-term labor, unspecified trimester: Secondary | ICD-10-CM

## 2017-08-12 DIAGNOSIS — Z3A30 30 weeks gestation of pregnancy: Secondary | ICD-10-CM

## 2017-08-12 DIAGNOSIS — Z331 Pregnant state, incidental: Secondary | ICD-10-CM

## 2017-08-12 DIAGNOSIS — O26893 Other specified pregnancy related conditions, third trimester: Secondary | ICD-10-CM

## 2017-08-12 DIAGNOSIS — O2203 Varicose veins of lower extremity in pregnancy, third trimester: Secondary | ICD-10-CM

## 2017-08-12 DIAGNOSIS — Z141 Cystic fibrosis carrier: Secondary | ICD-10-CM

## 2017-08-12 LAB — POCT WET PREP (WET MOUNT)
KOH Wet Prep POC: NEGATIVE
Trichomonas Wet Prep HPF POC: ABSENT

## 2017-08-12 LAB — POCT URINALYSIS DIPSTICK
Blood, UA: NEGATIVE
Glucose, UA: NEGATIVE
KETONES UA: NEGATIVE
Leukocytes, UA: NEGATIVE
Nitrite, UA: NEGATIVE
Protein, UA: NEGATIVE

## 2017-08-12 NOTE — Progress Notes (Signed)
Pt given Makena 275mg  SQ left arm without complications.

## 2017-08-12 NOTE — Progress Notes (Signed)
Patient ID: Emily Chan, female   DOB: 1980/11/15, 37 y.o.   MRN: [id number]   LOW-RISK PREGNANCY VISIT Patient name: Emily Chan MRN [id number]  Date of birth: 03/16/1981 Chief Complaint:   Routine Prenatal Visit  History of Present Illness:   Emily Chan is a 37 y.o. [id number] female at [redacted]w[redacted]d with an Estimated Date of Delivery: 10/19/17 being seen today for ongoing management of a low-risk pregnancy.  Today she reports itching and white discharge. After using one dose of monistat on 08/06/2017 the odor resolved. She also endorses mild constipation. 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  Contractions: Not present. Vag. Bleeding: None.  Movement: Present. denies leaking of fluid. Review of Systems:   Pertinent items are noted in HPI Denies abnormal vaginal discharge w/ itching/odor/irritation, headaches, visual changes, shortness of breath, chest pain, abdominal pain, severe nausea/vomiting, or problems with urination or bowel movements unless otherwise stated above. Pertinent History Reviewed:  Reviewed past medical,surgical, social, obstetrical and family history.  Reviewed problem list, medications and allergies. Physical Assessment:   Vitals:   08/12/17 0947  BP: (!) 98/54  Pulse: 89  Weight: 177 lb (80.3 kg)  Body mass index is 31.35 kg/m.        Physical Examination:   General appearance: Well appearing, and in no distress  Mental status: Alert, oriented to person, place, and time  Skin: Warm & dry  Cardiovascular: Normal heart rate noted  Respiratory: Normal respiratory effort, no distress  Abdomen: Soft, gravid, nontender  Pelvic: KOH negatvie, normal skin cells, no trich, no clue, light white discharge        Extremities: Edema: None  Fetal Status: Fetal Heart Rate (bpm): 135 Fundal Height: 32 cm Movement: Present    Results for orders placed or performed in visit on 08/12/17 (from the past 24 hour(s))    POCT urinalysis dipstick   Collection Time: 08/12/17  9:49 AM  Result Value Ref Range   Color, UA     Clarity, UA     Glucose, UA neg    Bilirubin, UA     Ketones, UA neg    Spec Grav, UA  1.010 - 1.025   Blood, UA neg    pH, UA  5.0 - 8.0   Protein, UA neg    Urobilinogen, UA  0.2 or 1.0 E.U./dL   Nitrite, UA neg    Leukocytes, UA Negative Negative   Appearance     Odor      Assessment & Plan:  1) Low-risk pregnancy [id number] at [redacted]w[redacted]d with an Estimated Date of Delivery: 10/19/17   2) Varicose veins, compression socks, baby ASA  3) Mild vaginal discharge, negative testing    Meds: No orders of the defined types were placed in this encounter.  Labs/procedures today: Wet prep  Plan:  Continue routine obstetrical care   Follow-up: Return in about 2 weeks (around 08/26/2017) for [place].  Orders Placed This Encounter  Procedures  . POCT urinalysis dipstick   By signing my name below, I, Margit Banda, attest that this documentation has been prepared under the direction and in the presence of Jonnie Kind, MD. Electronically Signed: Margit Banda, Medical Scribe. 08/12/17. 10:22 AM.  I personally performed the services described in this documentation, which was SCRIBED in my presence. The recorded information has been reviewed and considered accurate. It has been edited as necessary during review. Jonnie Kind, MD

## 2017-08-12 NOTE — Addendum Note (Signed)
Addended by: Gaylyn Rong A on: 08/12/2017 11:01 AM   Modules accepted: Orders

## 2017-08-26 ENCOUNTER — Ambulatory Visit (INDEPENDENT_AMBULATORY_CARE_PROVIDER_SITE_OTHER): Payer: PRIVATE HEALTH INSURANCE | Admitting: Women's Health

## 2017-08-26 ENCOUNTER — Other Ambulatory Visit: Payer: Self-pay

## 2017-08-26 ENCOUNTER — Encounter: Payer: Self-pay | Admitting: Women's Health

## 2017-08-26 VITALS — BP 108/66 | HR 86 | Wt 178.0 lb

## 2017-08-26 DIAGNOSIS — Z3A32 32 weeks gestation of pregnancy: Secondary | ICD-10-CM

## 2017-08-26 DIAGNOSIS — O09213 Supervision of pregnancy with history of pre-term labor, third trimester: Secondary | ICD-10-CM

## 2017-08-26 DIAGNOSIS — O09219 Supervision of pregnancy with history of pre-term labor, unspecified trimester: Secondary | ICD-10-CM

## 2017-08-26 DIAGNOSIS — O09899 Supervision of other high risk pregnancies, unspecified trimester: Secondary | ICD-10-CM

## 2017-08-26 DIAGNOSIS — Z3483 Encounter for supervision of other normal pregnancy, third trimester: Secondary | ICD-10-CM

## 2017-08-26 DIAGNOSIS — Z331 Pregnant state, incidental: Secondary | ICD-10-CM

## 2017-08-26 DIAGNOSIS — Z1389 Encounter for screening for other disorder: Secondary | ICD-10-CM

## 2017-08-26 LAB — POCT URINALYSIS DIPSTICK
GLUCOSE UA: NEGATIVE
Protein, UA: NEGATIVE

## 2017-08-26 NOTE — Progress Notes (Signed)
Pt given makena 275mg  SQ left arm without complications.

## 2017-08-26 NOTE — Progress Notes (Signed)
   LOW-RISK PREGNANCY VISIT Patient name: Emily Chan MRN [id number]  Date of birth: [birth year] Chief Complaint:   Routine Prenatal Visit (17P today)  History of Present Illness:   Emily Chan is a 37 y.o. [phone] female at [redacted]w[redacted]d with an Estimated Date of Delivery: 10/19/17 being seen today for ongoing management of a low-risk pregnancy.  Today she reports no complaints. Contractions: Not present. Vag. Bleeding: None.  Movement: Present. denies leaking of fluid. Review of Systems:   Pertinent items are noted in HPI Denies abnormal vaginal discharge w/ itching/odor/irritation, headaches, visual changes, shortness of breath, chest pain, abdominal pain, severe nausea/vomiting, or problems with urination or bowel movements unless otherwise stated above. Pertinent History Reviewed:  Reviewed past medical,surgical, social, obstetrical and family history.  Reviewed problem list, medications and allergies. Physical Assessment:   Vitals:   08/26/17 1005  BP: 108/66  Pulse: 86  Weight: 178 lb (80.7 kg)  Body mass index is 31.53 kg/m.        Physical Examination:   General appearance: Well appearing, and in no distress  Mental status: Alert, oriented to person, place, and time  Skin: Warm & dry  Cardiovascular: Normal heart rate noted  Respiratory: Normal respiratory effort, no distress  Abdomen: Soft, gravid, nontender  Pelvic: Cervical exam deferred         Extremities: Edema: None  Fetal Status: Fetal Heart Rate (bpm): 150 Fundal Height: 32 cm Movement: Present    No results found for this or any previous visit (from the past 24 hour(s)).  Assessment & Plan:  1) Low-risk pregnancy [id number] at [redacted]w[redacted]d with an Estimated Date of Delivery: 10/19/17   2) H/O PTB, weekly Makena, received dose today   Meds: No orders of the defined types were placed in this encounter.  Labs/procedures today: Makena  Plan:  Continue routine obstetrical care   Reviewed: Preterm labor symptoms and  general obstetric precautions including but not limited to vaginal bleeding, contractions, leaking of fluid and fetal movement were reviewed in detail with the patient. Recommended Tdap at HD/PCP per CDC guidelines.  All questions were answered  Follow-up: Return in about 2 weeks (around 09/09/2017) for [place].  Orders Placed This Encounter  Procedures  . POCT urinalysis dipstick   [name] CNM, [facility] 08/26/2017 10:53 AM

## 2017-08-26 NOTE — Patient Instructions (Signed)
Emily Chan, I greatly value your feedback.  If you receive a survey following your visit with [place] today, we appreciate you taking the time to fill it out.  Thanks, Emily Chan, CNM, WHNP-BC   Call the office [phone]) or go to [facility] if:  You begin to have strong, frequent contractions  Your water breaks.  Sometimes it is a big gush of fluid, sometimes it is just a trickle that keeps getting your panties wet or running down your legs  You have vaginal bleeding.  It is normal to have a small amount of spotting if your cervix was checked.   You don't feel your baby moving like normal.  If you don't, get you something to eat and drink and lay down and focus on feeling your baby move.  You should feel at least 10 movements in 2 hours.  If you don't, you should call the office or go to [facility].    Tdap Vaccine  It is recommended that you get the Tdap vaccine during the third trimester of EACH pregnancy to help protect your baby from getting pertussis (whooping cough)  27-36 weeks is the BEST time to do this so that you can pass the protection on to your baby. During pregnancy is better than after pregnancy, but if you are unable to get it during pregnancy it will be offered at the hospital.   You can get this vaccine at the health department or your family doctor  Everyone who will be around your baby should also be up-to-date on their vaccines. Adults (who are not pregnant) only need 1 dose of Tdap during adulthood.   Third Trimester of Pregnancy The third trimester is from week 29 through week 42, months 7 through 9. The third trimester is a time when the fetus is growing rapidly. At the end of the ninth month, the fetus is about 20 inches in length and weighs 6-10 pounds.  BODY CHANGES Your body goes through many changes during pregnancy. The changes vary from woman to woman.   Your weight will continue to increase. You can expect to gain 25-35 pounds (11-16 kg) by  the end of the pregnancy.  You may begin to get stretch marks on your hips, abdomen, and breasts.  You may urinate more often because the fetus is moving lower into your pelvis and pressing on your bladder.  You may develop or continue to have heartburn as a result of your pregnancy.  You may develop constipation because certain hormones are causing the muscles that push waste through your intestines to slow down.  You may develop hemorrhoids or swollen, bulging veins (varicose veins).  You may have pelvic pain because of the weight gain and pregnancy hormones relaxing your joints between the bones in your pelvis. Backaches may result from overexertion of the muscles supporting your posture.  You may have changes in your hair. These can include thickening of your hair, rapid growth, and changes in texture. Some women also have hair loss during or after pregnancy, or hair that feels dry or thin. Your hair will most likely return to normal after your baby is born.  Your breasts will continue to grow and be tender. A yellow discharge may leak from your breasts called colostrum.  Your belly button may stick out.  You may feel short of breath because of your expanding uterus.  You may notice the fetus "dropping," or moving lower in your abdomen.  You may have a bloody mucus  discharge. This usually occurs a few days to a week before labor begins.  Your cervix becomes thin and soft (effaced) near your due date. WHAT TO EXPECT AT YOUR PRENATAL EXAMS  You will have prenatal exams every 2 weeks until week 36. Then, you will have weekly prenatal exams. During a routine prenatal visit:  You will be weighed to make sure you and the fetus are growing normally.  Your blood pressure is taken.  Your abdomen will be measured to track your baby's growth.  The fetal heartbeat will be listened to.  Any test results from the previous visit will be discussed.  You may have a cervical check near your  due date to see if you have effaced. At around 36 weeks, your caregiver will check your cervix. At the same time, your caregiver will also perform a test on the secretions of the vaginal tissue. This test is to determine if a type of bacteria, Group B streptococcus, is present. Your caregiver will explain this further. Your caregiver may ask you:  What your birth plan is.  How you are feeling.  If you are feeling the baby move.  If you have had any abnormal symptoms, such as leaking fluid, bleeding, severe headaches, or abdominal cramping.  If you have any questions. Other tests or screenings that may be performed during your third trimester include:  Blood tests that check for low iron levels (anemia).  Fetal testing to check the health, activity level, and growth of the fetus. Testing is done if you have certain medical conditions or if there are problems during the pregnancy. FALSE LABOR You may feel small, irregular contractions that eventually go away. These are called Braxton Hicks contractions, or false labor. Contractions may last for hours, days, or even weeks before true labor sets in. If contractions come at regular intervals, intensify, or become painful, it is best to be seen by your caregiver.  SIGNS OF LABOR   Menstrual-like cramps.  Contractions that are 5 minutes apart or less.  Contractions that start on the top of the uterus and spread down to the lower abdomen and back.  A sense of increased pelvic pressure or back pain.  A watery or bloody mucus discharge that comes from the vagina. If you have any of these signs before the 37th week of pregnancy, call your caregiver right away. You need to go to the hospital to get checked immediately. HOME CARE INSTRUCTIONS   Avoid all smoking, herbs, alcohol, and unprescribed drugs. These chemicals affect the formation and growth of the baby.  Follow your caregiver's instructions regarding medicine use. There are medicines  that are either safe or unsafe to take during pregnancy.  Exercise only as directed by your caregiver. Experiencing uterine cramps is a good sign to stop exercising.  Continue to eat regular, healthy meals.  Wear a good support bra for breast tenderness.  Do not use hot tubs, steam rooms, or saunas.  Wear your seat belt at all times when driving.  Avoid raw meat, uncooked cheese, cat litter boxes, and soil used by cats. These carry germs that can cause birth defects in the baby.  Take your prenatal vitamins.  Try taking a stool softener (if your caregiver approves) if you develop constipation. Eat more high-fiber foods, such as fresh vegetables or fruit and whole grains. Drink plenty of fluids to keep your urine clear or pale yellow.  Take warm sitz baths to soothe any pain or discomfort caused by hemorrhoids. Use  hemorrhoid cream if your caregiver approves.  If you develop varicose veins, wear support hose. Elevate your feet for 15 minutes, 3-4 times a day. Limit salt in your diet.  Avoid heavy lifting, wear low heal shoes, and practice good posture.  Rest a lot with your legs elevated if you have leg cramps or low back pain.  Visit your dentist if you have not gone during your pregnancy. Use a soft toothbrush to brush your teeth and be gentle when you floss.  A sexual relationship may be continued unless your caregiver directs you otherwise.  Do not travel far distances unless it is absolutely necessary and only with the approval of your caregiver.  Take prenatal classes to understand, practice, and ask questions about the labor and delivery.  Make a trial run to the hospital.  Pack your hospital bag.  Prepare the baby's nursery.  Continue to go to all your prenatal visits as directed by your caregiver. SEEK MEDICAL CARE IF:  You are unsure if you are in labor or if your water has broken.  You have dizziness.  You have mild pelvic cramps, pelvic pressure, or nagging  pain in your abdominal area.  You have persistent nausea, vomiting, or diarrhea.  You have a bad smelling vaginal discharge.  You have pain with urination. SEEK IMMEDIATE MEDICAL CARE IF:   You have a fever.  You are leaking fluid from your vagina.  You have spotting or bleeding from your vagina.  You have severe abdominal cramping or pain.  You have rapid weight loss or gain.  You have shortness of breath with chest pain.  You notice sudden or extreme swelling of your face, hands, ankles, feet, or legs.  You have not felt your baby move in over an hour.  You have severe headaches that do not go away with medicine.  You have vision changes. Document Released: 03/06/2001 Document Revised: 03/17/2013 Document Reviewed: 05/13/2012 [facility] Patient Information 2015 [place], [place]. This information is not intended to replace advice given to you by your health care provider. Make sure you discuss any questions you have with your health care provider.

## 2017-09-09 ENCOUNTER — Ambulatory Visit (INDEPENDENT_AMBULATORY_CARE_PROVIDER_SITE_OTHER): Payer: PRIVATE HEALTH INSURANCE | Admitting: Women's Health

## 2017-09-09 ENCOUNTER — Encounter: Payer: Self-pay | Admitting: Women's Health

## 2017-09-09 ENCOUNTER — Encounter: Payer: PRIVATE HEALTH INSURANCE | Admitting: Women's Health

## 2017-09-09 VITALS — BP 114/63 | HR 80 | Wt 186.0 lb

## 2017-09-09 DIAGNOSIS — Z3A34 34 weeks gestation of pregnancy: Secondary | ICD-10-CM

## 2017-09-09 DIAGNOSIS — Z3483 Encounter for supervision of other normal pregnancy, third trimester: Secondary | ICD-10-CM

## 2017-09-09 DIAGNOSIS — O09213 Supervision of pregnancy with history of pre-term labor, third trimester: Secondary | ICD-10-CM

## 2017-09-09 DIAGNOSIS — Z331 Pregnant state, incidental: Secondary | ICD-10-CM

## 2017-09-09 DIAGNOSIS — Z1389 Encounter for screening for other disorder: Secondary | ICD-10-CM

## 2017-09-09 DIAGNOSIS — O2203 Varicose veins of lower extremity in pregnancy, third trimester: Secondary | ICD-10-CM

## 2017-09-09 LAB — POCT URINALYSIS DIPSTICK
Blood, UA: NEGATIVE
Glucose, UA: NEGATIVE
KETONES UA: NEGATIVE
Leukocytes, UA: NEGATIVE
Nitrite, UA: NEGATIVE
Protein, UA: NEGATIVE

## 2017-09-09 NOTE — Patient Instructions (Signed)
Emily Chan, I greatly value your feedback.  If you receive a survey following your visit with us today, we appreciate you taking the time to fill it out.  Thanks, Kim Cairo Lingenfelter, CNM, WHNP-BC   Call the office ([phone]) or go to [facility] if:  You begin to have strong, frequent contractions  Your water breaks.  Sometimes it is a big gush of fluid, sometimes it is just a trickle that keeps getting your panties wet or running down your legs  You have vaginal bleeding.  It is normal to have a small amount of spotting if your cervix was checked.   You don't feel your baby moving like normal.  If you don't, get you something to eat and drink and lay down and focus on feeling your baby move.  You should feel at least 10 movements in 2 hours.  If you don't, you should call the office or go to [facility].     Preterm Labor and Birth Information The normal length of a pregnancy is 39-41 weeks. Preterm labor is when labor starts before 37 completed weeks of pregnancy. What are the risk factors for preterm labor? Preterm labor is more likely to occur in women who:  Have certain infections during pregnancy such as a bladder infection, sexually transmitted infection, or infection inside the uterus (chorioamnionitis).  Have a shorter-than-normal cervix.  Have gone into preterm labor before.  Have had surgery on their cervix.  Are younger than age 17 or older than age 35.  Are African American.  Are pregnant with twins or multiple babies (multiple gestation).  Take street drugs or smoke while pregnant.  Do not gain enough weight while pregnant.  Became pregnant shortly after having been pregnant.  What are the symptoms of preterm labor? Symptoms of preterm labor include:  Cramps similar to those that can happen during a menstrual period. The cramps may happen with diarrhea.  Pain in the abdomen or lower back.  Regular uterine contractions that may feel like tightening of  the abdomen.  A feeling of increased pressure in the pelvis.  Increased watery or bloody mucus discharge from the vagina.  Water breaking (ruptured amniotic sac).  Why is it important to recognize signs of preterm labor? It is important to recognize signs of preterm labor because babies who are born prematurely may not be fully developed. This can put them at an increased risk for:  Long-term (chronic) heart and lung problems.  Difficulty immediately after birth with regulating body systems, including blood sugar, body temperature, heart rate, and breathing rate.  Bleeding in the brain.  Cerebral palsy.  Learning difficulties.  Death.  These risks are highest for babies who are born before 34 weeks of pregnancy. How is preterm labor treated? Treatment depends on the length of your pregnancy, your condition, and the health of your baby. It may involve:  Having a stitch (suture) placed in your cervix to prevent your cervix from opening too early (cerclage).  Taking or being given medicines, such as: ? Hormone medicines. These may be given early in pregnancy to help support the pregnancy. ? Medicine to stop contractions. ? Medicines to help mature the baby's lungs. These may be prescribed if the risk of delivery is high. ? Medicines to prevent your baby from developing cerebral palsy.  If the labor happens before 34 weeks of pregnancy, you may need to stay in the hospital. What should I do if I think I am in preterm labor? If you   think that you are going into preterm labor, call your health care provider right away. How can I prevent preterm labor in future pregnancies? To increase your chance of having a full-term pregnancy:  Do not use any tobacco products, such as cigarettes, chewing tobacco, and e-cigarettes. If you need help quitting, ask your health care provider.  Do not use street drugs or medicines that have not been prescribed to you during your pregnancy.  Talk  with your health care provider before taking any herbal supplements, even if you have been taking them regularly.  Make sure you gain a healthy amount of weight during your pregnancy.  Watch for infection. If you think that you might have an infection, get it checked right away.  Make sure to tell your health care provider if you have gone into preterm labor before.  This information is not intended to replace advice given to you by your health care provider. Make sure you discuss any questions you have with your health care provider. Document Released: 06/02/2003 Document Revised: 08/23/2015 Document Reviewed: 08/03/2015 Elsevier Interactive Patient Education  2018 [employer].

## 2017-09-09 NOTE — Progress Notes (Signed)
LOW-RISK PREGNANCY VISIT Patient name: Emily Chan MRN [id number]  Date of birth: 06/19/1980 Chief Complaint:   Routine Prenatal Visit  History of Present Illness:   Emily Chan is a 37 y.o. [id number] female at [redacted]w[redacted]d with an Estimated Date of Delivery: 10/19/17 being seen today for ongoing management of a low-risk pregnancy.  Today she reports varicose veins make legs feel sore, can only wear compression hose for about 9 hours. Plans to go to beach 7/1-7/4. Contractions: Not present. Vag. Bleeding: None.  Movement: Present. denies leaking of fluid. Review of Systems:   Pertinent items are noted in HPI Denies abnormal vaginal discharge w/ itching/odor/irritation, headaches, visual changes, shortness of breath, chest pain, abdominal pain, severe nausea/vomiting, or problems with urination or bowel movements unless otherwise stated above. Pertinent History Reviewed:  Reviewed past medical,surgical, social, obstetrical and family history.  Reviewed problem list, medications and allergies. Physical Assessment:   Vitals:   09/09/17 1626  BP: 114/63  Pulse: 80  Weight: 186 lb (84.4 kg)  Body mass index is 32.95 kg/m.        Physical Examination:   General appearance: Well appearing, and in no distress  Mental status: Alert, oriented to person, place, and time  Skin: Warm & dry  Cardiovascular: Normal heart rate noted  Respiratory: Normal respiratory effort, no distress  Abdomen: Soft, gravid, nontender  Pelvic: Cervical exam deferred         Extremities: Edema: None  Fetal Status: Fetal Heart Rate (bpm): 140 Fundal Height: 34 cm Movement: Present    Results for orders placed or performed in visit on 09/09/17 (from the past 24 hour(s))  POCT urinalysis dipstick   Collection Time: 09/09/17  4:27 PM  Result Value Ref Range   Color, UA     Clarity, UA     Glucose, UA Negative Negative   Bilirubin, UA     Ketones, UA neg    Spec Grav, UA  1.010 - 1.025   Blood, UA neg    pH,  UA  5.0 - 8.0   Protein, UA Negative Negative   Urobilinogen, UA  0.2 or 1.0 E.U./dL   Nitrite, UA neg    Leukocytes, UA Negative Negative   Appearance     Odor      Assessment & Plan:  1) Low-risk pregnancy [id number] at [redacted]w[redacted]d with an Estimated Date of Delivery: 10/19/17   2) Varicose veins, continue compression stockings, elevate legs as much as possible  3) Travel> will be 36wks when goes to beach- would naturally be her next visit- so plan to come next week @ 35wks, then 2wks later at 37wks. Discussed she is at a high risk for DVT/PE when pregnancy and traveling increases that risk. Recommended walking around q 1hr, don't cross legs, and dorsiflex feet often to help decrease r/f DVT/PE.  Travel not recommended if ptl s/s.    4) H/O PTB> continue weekly Makena> MIL giving at home   Meds: No orders of the defined types were placed in this encounter.  Labs/procedures today: none  Plan:  Continue routine obstetrical care   Reviewed: Preterm labor symptoms and general obstetric precautions including but not limited to vaginal bleeding, contractions, leaking of fluid and fetal movement were reviewed in detail with the patient.  All questions were answered  Follow-up: Return in about 2 weeks (around 09/23/2017) for [place].  Orders Placed This Encounter  Procedures  . POCT urinalysis dipstick   [name] CNM, [facility] 09/09/2017 4:55 PM

## 2017-09-19 ENCOUNTER — Ambulatory Visit (INDEPENDENT_AMBULATORY_CARE_PROVIDER_SITE_OTHER): Payer: PRIVATE HEALTH INSURANCE | Admitting: Women's Health

## 2017-09-19 ENCOUNTER — Encounter: Payer: Self-pay | Admitting: Women's Health

## 2017-09-19 VITALS — BP 100/59 | HR 84 | Wt 186.0 lb

## 2017-09-19 DIAGNOSIS — Z3A35 35 weeks gestation of pregnancy: Secondary | ICD-10-CM

## 2017-09-19 DIAGNOSIS — Z3483 Encounter for supervision of other normal pregnancy, third trimester: Secondary | ICD-10-CM

## 2017-09-19 DIAGNOSIS — Z1389 Encounter for screening for other disorder: Secondary | ICD-10-CM

## 2017-09-19 DIAGNOSIS — O09213 Supervision of pregnancy with history of pre-term labor, third trimester: Secondary | ICD-10-CM

## 2017-09-19 DIAGNOSIS — R350 Frequency of micturition: Secondary | ICD-10-CM

## 2017-09-19 DIAGNOSIS — O26893 Other specified pregnancy related conditions, third trimester: Secondary | ICD-10-CM

## 2017-09-19 DIAGNOSIS — Z331 Pregnant state, incidental: Secondary | ICD-10-CM

## 2017-09-19 LAB — POCT URINALYSIS DIPSTICK
Blood, UA: NEGATIVE
Glucose, UA: NEGATIVE
Ketones, UA: NEGATIVE
LEUKOCYTES UA: NEGATIVE
NITRITE UA: NEGATIVE
PROTEIN UA: POSITIVE — AB

## 2017-09-19 NOTE — Progress Notes (Signed)
   LOW-RISK PREGNANCY VISIT Patient name: Emily Chan MRN [id number]  Date of birth: March 19, 1981 Chief Complaint:   low risk ob (pain and pressure)  History of Present Illness:   Emily Chan is a 37 y.o. [phone] female at [redacted]w[redacted]d with an Estimated Date of Delivery: 10/19/17 being seen today for ongoing management of a low-risk pregnancy.  Today she reports pressure, lower pelvic pain over weekend- was constant, didn't feel like uc's to her, resolved when she went home rested. Some urinary frequency. No other uti sx.  Contractions: Not present.  .  Movement: Present. denies leaking of fluid. Review of Systems:   Pertinent items are noted in HPI Denies abnormal vaginal discharge w/ itching/odor/irritation, headaches, visual changes, shortness of breath, chest pain, abdominal pain, severe nausea/vomiting, or problems with urination or bowel movements unless otherwise stated above. Pertinent History Reviewed:  Reviewed past medical,surgical, social, obstetrical and family history.  Reviewed problem list, medications and allergies. Physical Assessment:   Vitals:   09/19/17 0849  BP: (!) 100/59  Pulse: 84  Weight: 186 lb (84.4 kg)  Body mass index is 32.95 kg/m.        Physical Examination:   General appearance: Well appearing, and in no distress  Mental status: Alert, oriented to person, place, and time  Skin: Warm & dry  Cardiovascular: Normal heart rate noted  Respiratory: Normal respiratory effort, no distress  Abdomen: Soft, gravid, nontender  Pelvic: Cervical exam performed  Dilation: Fingertip Effacement (%): Thick Station: -3  Extremities: Edema: None  Fetal Status: Fetal Heart Rate (bpm): 131 Fundal Height: 35 cm Movement: Present Presentation: Vertex  Results for orders placed or performed in visit on 09/19/17 (from the past 24 hour(s))  POCT urinalysis dipstick   Collection Time: 09/19/17  8:59 AM  Result Value Ref Range   Color, UA     Clarity, UA     Glucose, UA  Negative Negative   Bilirubin, UA     Ketones, UA neg    Spec Grav, UA  1.010 - 1.025   Blood, UA neg    pH, UA  5.0 - 8.0   Protein, UA Positive (A) Negative   Urobilinogen, UA  0.2 or 1.0 E.U./dL   Nitrite, UA neg    Leukocytes, UA Negative Negative   Appearance     Odor      Assessment & Plan:  1) Low-risk pregnancy [id number] at [redacted]w[redacted]d with an Estimated Date of Delivery: 10/19/17   2) H/O PTB> continue Makena, will do last dose this Sunday (MIL gives), having pressure, no cervical changes  3) Urinary frequency> will send urine cx   Meds: No orders of the defined types were placed in this encounter.  Labs/procedures today: sve, urine cx  Plan:  Continue routine obstetrical care   Reviewed: Preterm labor symptoms and general obstetric precautions including but not limited to vaginal bleeding, contractions, leaking of fluid and fetal movement were reviewed in detail with the patient.  All questions were answered  Follow-up: Return in about 1 week (around 09/26/2017) for [place]. and gbs, gc/ct  Orders Placed This Encounter  Procedures  . Urine Culture  . POCT urinalysis dipstick   [name] CNM, [facility] 09/19/2017 9:19 AM

## 2017-09-19 NOTE — Patient Instructions (Signed)
Emily Chan, I greatly value your feedback.  If you receive a survey following your visit with [place] today, we appreciate you taking the time to fill it out.  Thanks, Knute Neu, CNM, WHNP-BC   Call the office [phone]) or go to [facility] if:  You begin to have strong, frequent contractions  Your water breaks.  Sometimes it is a big gush of fluid, sometimes it is just a trickle that keeps getting your panties wet or running down your legs  You have vaginal bleeding.  It is normal to have a small amount of spotting if your cervix was checked.   You don't feel your baby moving like normal.  If you don't, get you something to eat and drink and lay down and focus on feeling your baby move.  You should feel at least 10 movements in 2 hours.  If you don't, you should call the office or go to [place] and Birth Information The normal length of a pregnancy is 39-41 weeks. Preterm labor is when labor starts before 37 completed weeks of pregnancy. What are the risk factors for preterm labor? Preterm labor is more likely to occur in women who:  Have certain infections during pregnancy such as a bladder infection, sexually transmitted infection, or infection inside the uterus (chorioamnionitis).  Have a shorter-than-normal cervix.  Have gone into preterm labor before.  Have had surgery on their cervix.  Are younger than age 65 or older than age 29.  Are African American.  Are pregnant with twins or multiple babies (multiple gestation).  Take street drugs or smoke while pregnant.  Do not gain enough weight while pregnant.  Became pregnant shortly after having been pregnant.  What are the symptoms of preterm labor? Symptoms of preterm labor include:  Cramps similar to those that can happen during a menstrual period. The cramps may happen with diarrhea.  Pain in the abdomen or lower back.  Regular uterine contractions that may feel like tightening of  the abdomen.  A feeling of increased pressure in the pelvis.  Increased watery or bloody mucus discharge from the vagina.  Water breaking (ruptured amniotic sac).  Why is it important to recognize signs of preterm labor? It is important to recognize signs of preterm labor because babies who are born prematurely may not be fully developed. This can put them at an increased risk for:  Long-term (chronic) heart and lung problems.  Difficulty immediately after birth with regulating body systems, including blood sugar, body temperature, heart rate, and breathing rate.  Bleeding in the brain.  Cerebral palsy.  Learning difficulties.  Death.  These risks are highest for babies who are born before 62 weeks of pregnancy. How is preterm labor treated? Treatment depends on the length of your pregnancy, your condition, and the health of your baby. It may involve:  Having a stitch (suture) placed in your cervix to prevent your cervix from opening too early (cerclage).  Taking or being given medicines, such as: ? Hormone medicines. These may be given early in pregnancy to help support the pregnancy. ? Medicine to stop contractions. ? Medicines to help mature the baby's lungs. These may be prescribed if the risk of delivery is high. ? Medicines to prevent your baby from developing cerebral palsy.  If the labor happens before 34 weeks of pregnancy, you may need to stay in the hospital. What should I do if I think I am in preterm labor? If you  think that you are going into preterm labor, call your health care provider right away. How can I prevent preterm labor in future pregnancies? To increase your chance of having a full-term pregnancy:  Do not use any tobacco products, such as cigarettes, chewing tobacco, and e-cigarettes. If you need help quitting, ask your health care provider.  Do not use street drugs or medicines that have not been prescribed to you during your pregnancy.  Talk  with your health care provider before taking any herbal supplements, even if you have been taking them regularly.  Make sure you gain a healthy amount of weight during your pregnancy.  Watch for infection. If you think that you might have an infection, get it checked right away.  Make sure to tell your health care provider if you have gone into preterm labor before.  This information is not intended to replace advice given to you by your health care provider. Make sure you discuss any questions you have with your health care provider. Document Released: 06/02/2003 Document Revised: 08/23/2015 Document Reviewed: 08/03/2015 Elsevier Interactive Patient Education  2018 [employer].

## 2017-09-21 LAB — URINE CULTURE

## 2017-09-23 ENCOUNTER — Telehealth: Payer: Self-pay | Admitting: *Deleted

## 2017-09-23 NOTE — Telephone Encounter (Signed)
Pt called requesting results of urine culture. DOB verified. Informed pt that results were negative. Pt verbalized understanding.

## 2017-09-30 ENCOUNTER — Encounter: Payer: Self-pay | Admitting: Women's Health

## 2017-09-30 ENCOUNTER — Other Ambulatory Visit: Payer: Self-pay

## 2017-09-30 ENCOUNTER — Ambulatory Visit (INDEPENDENT_AMBULATORY_CARE_PROVIDER_SITE_OTHER): Payer: PRIVATE HEALTH INSURANCE | Admitting: Women's Health

## 2017-09-30 VITALS — BP 110/71 | HR 96 | Wt 186.0 lb

## 2017-09-30 DIAGNOSIS — Z3483 Encounter for supervision of other normal pregnancy, third trimester: Secondary | ICD-10-CM

## 2017-09-30 DIAGNOSIS — Z331 Pregnant state, incidental: Secondary | ICD-10-CM

## 2017-09-30 DIAGNOSIS — Z3A37 37 weeks gestation of pregnancy: Secondary | ICD-10-CM

## 2017-09-30 DIAGNOSIS — Z1389 Encounter for screening for other disorder: Secondary | ICD-10-CM

## 2017-09-30 DIAGNOSIS — Z8751 Personal history of pre-term labor: Secondary | ICD-10-CM

## 2017-09-30 DIAGNOSIS — O09213 Supervision of pregnancy with history of pre-term labor, third trimester: Secondary | ICD-10-CM

## 2017-09-30 LAB — POCT URINALYSIS DIPSTICK
Glucose, UA: NEGATIVE
Ketones, UA: NEGATIVE
Leukocytes, UA: NEGATIVE
Nitrite, UA: NEGATIVE
Protein, UA: POSITIVE — AB
RBC UA: NEGATIVE

## 2017-09-30 LAB — OB RESULTS CONSOLE GBS: GBS: NEGATIVE

## 2017-09-30 MED ORDER — HYDROXYPROGESTERONE CAPROATE 275 MG/1.1ML ~~LOC~~ SOAJ
275.0000 mg | Freq: Once | SUBCUTANEOUS | Status: AC
  Administered 2017-09-22: 275 mg via SUBCUTANEOUS

## 2017-09-30 NOTE — Patient Instructions (Signed)
Harrington Challenger, I greatly value your feedback.  If you receive a survey following your visit with [place] today, we appreciate you taking the time to fill it out.  Thanks, Knute Neu, CNM, WHNP-BC   Call the office [phone]) or go to [facility] if:  You begin to have strong, frequent contractions  Your water breaks.  Sometimes it is a big gush of fluid, sometimes it is just a trickle that keeps getting your panties wet or running down your legs  You have vaginal bleeding.  It is normal to have a small amount of spotting if your cervix was checked.   You don't feel your baby moving like normal.  If you don't, get you something to eat and drink and lay down and focus on feeling your baby move.  You should feel at least 10 movements in 2 hours.  If you don't, you should call the office or go to [place] Contractions Contractions of the uterus can occur throughout pregnancy, but they are not always a sign that you are in labor. You may have practice contractions called Braxton Hicks contractions. These false labor contractions are sometimes confused with true labor. What are [place] contractions? Braxton Hicks contractions are tightening movements that occur in the muscles of the uterus before labor. Unlike true labor contractions, these contractions do not result in opening (dilation) and thinning of the cervix. Toward the end of pregnancy (32-34 weeks), Braxton Hicks contractions can happen more often and may become stronger. These contractions are sometimes difficult to tell apart from true labor because they can be very uncomfortable. You should not feel embarrassed if you go to the hospital with false labor. Sometimes, the only way to tell if you are in true labor is for your health care provider to look for changes in the cervix. The health care provider will do a physical exam and may monitor your contractions. If you are not in true labor, the exam should  show that your cervix is not dilating and your water has not broken. If there are other health problems associated with your pregnancy, it is completely safe for you to be sent home with false labor. You may continue to have Braxton Hicks contractions until you go into true labor. How to tell the difference between true labor and false labor True labor  Contractions last 30-70 seconds.  Contractions become very regular.  Discomfort is usually felt in the top of the uterus, and it spreads to the lower abdomen and low back.  Contractions do not go away with walking.  Contractions usually become more intense and increase in frequency.  The cervix dilates and gets thinner. False labor  Contractions are usually shorter and not as strong as true labor contractions.  Contractions are usually irregular.  Contractions are often felt in the front of the lower abdomen and in the groin.  Contractions may go away when you walk around or change positions while lying down.  Contractions get weaker and are shorter-lasting as time goes on.  The cervix usually does not dilate or become thin. Follow these instructions at home:  Take over-the-counter and prescription medicines only as told by your health care provider.  Keep up with your usual exercises and follow other instructions from your health care provider.  Eat and drink lightly if you think you are going into labor.  If Braxton Hicks contractions are making you uncomfortable: ? Change your position from lying down  or resting to walking, or change from walking to resting. ? Sit and rest in a tub of warm water. ? Drink enough fluid to keep your urine pale yellow. Dehydration may cause these contractions. ? Do slow and deep breathing several times an hour.  Keep all follow-up prenatal visits as told by your health care provider. This is important. Contact a health care provider if:  You have a fever.  You have continuous pain in  your abdomen. Get help right away if:  Your contractions become stronger, more regular, and closer together.  You have fluid leaking or gushing from your vagina.  You pass blood-tinged mucus (bloody show).  You have bleeding from your vagina.  You have low back pain that you never had before.  You feel your baby's head pushing down and causing pelvic pressure.  Your baby is not moving inside you as much as it used to. Summary  Contractions that occur before labor are called Braxton Hicks contractions, false labor, or practice contractions.  Braxton Hicks contractions are usually shorter, weaker, farther apart, and less regular than true labor contractions. True labor contractions usually become progressively stronger and regular and they become more frequent.  Manage discomfort from [facility] contractions by changing position, resting in a warm bath, drinking plenty of water, or practicing deep breathing. This information is not intended to replace advice given to you by your health care provider. Make sure you discuss any questions you have with your health care provider. Document Released: 07/26/2016 Document Revised: 07/26/2016 Document Reviewed: 07/26/2016 Elsevier Interactive Patient Education  2018 [employer].

## 2017-09-30 NOTE — Progress Notes (Signed)
   LOW-RISK PREGNANCY VISIT Patient name: Emily Chan MRN [id number]  Date of birth: 1980/09/09 Chief Complaint:   Routine Prenatal Visit (gbs/gc today)  History of Present Illness:   Emily Chan is a 37 y.o. [phone] female at [redacted]w[redacted]d with an Estimated Date of Delivery: 10/19/17 being seen today for ongoing management of a low-risk pregnancy.  Today she reports no complaints. Contractions: Not present. Vag. Bleeding: None.  Movement: Present. denies leaking of fluid. Review of Systems:   Pertinent items are noted in HPI Denies abnormal vaginal discharge w/ itching/odor/irritation, headaches, visual changes, shortness of breath, chest pain, abdominal pain, severe nausea/vomiting, or problems with urination or bowel movements unless otherwise stated above. Pertinent History Reviewed:  Reviewed past medical,surgical, social, obstetrical and family history.  Reviewed problem list, medications and allergies. Physical Assessment:   Vitals:   09/30/17 0924  BP: 110/71  Pulse: 96  Weight: 186 lb (84.4 kg)  Body mass index is 32.95 kg/m.        Physical Examination:   General appearance: Well appearing, and in no distress  Mental status: Alert, oriented to person, place, and time  Skin: Warm & dry  Cardiovascular: Normal heart rate noted  Respiratory: Normal respiratory effort, no distress  Abdomen: Soft, gravid, nontender  Pelvic: Cervical exam performed  Dilation: 1 Effacement (%): 50 Station: -2  Extremities: Edema: None  Fetal Status: Fetal Heart Rate (bpm): 135 Fundal Height: 37 cm Movement: Present Presentation: Vertex  Results for orders placed or performed in visit on 09/30/17 (from the past 24 hour(s))  POCT urinalysis dipstick   Collection Time: 09/30/17  9:25 AM  Result Value Ref Range   Color, UA     Clarity, UA     Glucose, UA Negative Negative   Bilirubin, UA     Ketones, UA neg    Spec Grav, UA  1.010 - 1.025   Blood, UA neg    pH, UA  5.0 - 8.0   Protein, UA  Positive (A) Negative   Urobilinogen, UA  0.2 or 1.0 E.U./dL   Nitrite, UA neg    Leukocytes, UA Negative Negative   Appearance     Odor      Assessment & Plan:  1) Low-risk pregnancy [id number] at [redacted]w[redacted]d with an Estimated Date of Delivery: 10/19/17   2) H/O PTB, finished w/ Makena!   Meds:  Meds ordered this encounter  Medications  . HYDROXYprogesterone Caproate SOAJ 275 mg   Labs/procedures today: gbs, gc/ct, sve  Plan:  Continue routine obstetrical care   Reviewed: Term labor symptoms and general obstetric precautions including but not limited to vaginal bleeding, contractions, leaking of fluid and fetal movement were reviewed in detail with the patient.  All questions were answered  Follow-up: Return in about 1 week (around 10/07/2017) for [place].  Orders Placed This Encounter  Procedures  . Culture, beta strep (group b only)  . GC/Chlamydia Probe Amp  . POCT urinalysis dipstick   [name] CNM, [facility] 09/30/2017 10:04 AM

## 2017-10-02 LAB — GC/CHLAMYDIA PROBE AMP
Chlamydia trachomatis, NAA: NEGATIVE
NEISSERIA GONORRHOEAE BY PCR: NEGATIVE

## 2017-10-04 LAB — CULTURE, BETA STREP (GROUP B ONLY): Strep Gp B Culture: NEGATIVE

## 2017-10-07 ENCOUNTER — Ambulatory Visit (INDEPENDENT_AMBULATORY_CARE_PROVIDER_SITE_OTHER): Payer: PRIVATE HEALTH INSURANCE | Admitting: Women's Health

## 2017-10-07 ENCOUNTER — Encounter: Payer: Self-pay | Admitting: Women's Health

## 2017-10-07 VITALS — BP 104/66 | HR 80 | Wt 190.0 lb

## 2017-10-07 DIAGNOSIS — Z3483 Encounter for supervision of other normal pregnancy, third trimester: Secondary | ICD-10-CM

## 2017-10-07 DIAGNOSIS — Z1389 Encounter for screening for other disorder: Secondary | ICD-10-CM

## 2017-10-07 DIAGNOSIS — Z3A38 38 weeks gestation of pregnancy: Secondary | ICD-10-CM

## 2017-10-07 DIAGNOSIS — Z331 Pregnant state, incidental: Secondary | ICD-10-CM

## 2017-10-07 LAB — POCT URINALYSIS DIPSTICK
Blood, UA: NEGATIVE
Glucose, UA: NEGATIVE
KETONES UA: NEGATIVE
LEUKOCYTES UA: NEGATIVE
NITRITE UA: NEGATIVE
PROTEIN UA: POSITIVE — AB

## 2017-10-07 NOTE — Patient Instructions (Signed)
Emily Chan, I greatly value your feedback.  If you receive a survey following your visit with [place] today, we appreciate you taking the time to fill it out.  Thanks, Emily Chan, CNM, WHNP-BC   Call the office [phone]) or go to [facility] if:  You begin to have strong, frequent contractions  Your water breaks.  Sometimes it is a big gush of fluid, sometimes it is just a trickle that keeps getting your panties wet or running down your legs  You have vaginal bleeding.  It is normal to have a small amount of spotting if your cervix was checked.   You don't feel your baby moving like normal.  If you don't, get you something to eat and drink and lay down and focus on feeling your baby move.  You should feel at least 10 movements in 2 hours.  If you don't, you should call the office or go to [place] Contractions Contractions of the uterus can occur throughout pregnancy, but they are not always a sign that you are in labor. You may have practice contractions called Braxton Hicks contractions. These false labor contractions are sometimes confused with true labor. What are [place] contractions? Braxton Hicks contractions are tightening movements that occur in the muscles of the uterus before labor. Unlike true labor contractions, these contractions do not result in opening (dilation) and thinning of the cervix. Toward the end of pregnancy (32-34 weeks), Braxton Hicks contractions can happen more often and may become stronger. These contractions are sometimes difficult to tell apart from true labor because they can be very uncomfortable. You should not feel embarrassed if you go to the hospital with false labor. Sometimes, the only way to tell if you are in true labor is for your health care provider to look for changes in the cervix. The health care provider will do a physical exam and may monitor your contractions. If you are not in true labor, the exam should  show that your cervix is not dilating and your water has not broken. If there are other health problems associated with your pregnancy, it is completely safe for you to be sent home with false labor. You may continue to have Braxton Hicks contractions until you go into true labor. How to tell the difference between true labor and false labor True labor  Contractions last 30-70 seconds.  Contractions become very regular.  Discomfort is usually felt in the top of the uterus, and it spreads to the lower abdomen and low back.  Contractions do not go away with walking.  Contractions usually become more intense and increase in frequency.  The cervix dilates and gets thinner. False labor  Contractions are usually shorter and not as strong as true labor contractions.  Contractions are usually irregular.  Contractions are often felt in the front of the lower abdomen and in the groin.  Contractions may go away when you walk around or change positions while lying down.  Contractions get weaker and are shorter-lasting as time goes on.  The cervix usually does not dilate or become thin. Follow these instructions at home:  Take over-the-counter and prescription medicines only as told by your health care provider.  Keep up with your usual exercises and follow other instructions from your health care provider.  Eat and drink lightly if you think you are going into labor.  If Braxton Hicks contractions are making you uncomfortable: ? Change your position from lying down  or resting to walking, or change from walking to resting. ? Sit and rest in a tub of warm water. ? Drink enough fluid to keep your urine pale yellow. Dehydration may cause these contractions. ? Do slow and deep breathing several times an hour.  Keep all follow-up prenatal visits as told by your health care provider. This is important. Contact a health care provider if:  You have a fever.  You have continuous pain in  your abdomen. Get help right away if:  Your contractions become stronger, more regular, and closer together.  You have fluid leaking or gushing from your vagina.  You pass blood-tinged mucus (bloody show).  You have bleeding from your vagina.  You have low back pain that you never had before.  You feel your baby's head pushing down and causing pelvic pressure.  Your baby is not moving inside you as much as it used to. Summary  Contractions that occur before labor are called Braxton Hicks contractions, false labor, or practice contractions.  Braxton Hicks contractions are usually shorter, weaker, farther apart, and less regular than true labor contractions. True labor contractions usually become progressively stronger and regular and they become more frequent.  Manage discomfort from [facility] contractions by changing position, resting in a warm bath, drinking plenty of water, or practicing deep breathing. This information is not intended to replace advice given to you by your health care provider. Make sure you discuss any questions you have with your health care provider. Document Released: 07/26/2016 Document Revised: 07/26/2016 Document Reviewed: 07/26/2016 Elsevier Interactive Patient Education  2018 [employer].

## 2017-10-07 NOTE — Progress Notes (Signed)
   LOW-RISK PREGNANCY VISIT Patient name: Emily Chan MRN [id number]  Date of birth: 06/02/1980 Chief Complaint:   low risk ob (feeling uncomfortable)  History of Present Illness:   Emily Chan is a 37 y.o. [phone] female at [redacted]w[redacted]d with an Estimated Date of Delivery: 10/19/17 being seen today for ongoing management of a low-risk pregnancy.  Today she reports just feeling uncomfortable . Contractions: Not present.  .  Movement: Present. denies leaking of fluid. Review of Systems:   Pertinent items are noted in HPI Denies abnormal vaginal discharge w/ itching/odor/irritation, headaches, visual changes, shortness of breath, chest pain, abdominal pain, severe nausea/vomiting, or problems with urination or bowel movements unless otherwise stated above. Pertinent History Reviewed:  Reviewed past medical,surgical, social, obstetrical and family history.  Reviewed problem list, medications and allergies. Physical Assessment:   Vitals:   10/07/17 0842  BP: 104/66  Pulse: 80  Weight: 190 lb (86.2 kg)  Body mass index is 33.66 kg/m.        Physical Examination:   General appearance: Well appearing, and in no distress  Mental status: Alert, oriented to person, place, and time  Skin: Warm & dry  Cardiovascular: Normal heart rate noted  Respiratory: Normal respiratory effort, no distress  Abdomen: Soft, gravid, nontender  Pelvic: Cervical exam performed  Dilation: 1.5 Effacement (%): 50 Station: -2  Extremities: Edema: None  Fetal Status: Fetal Heart Rate (bpm): 144 Fundal Height: 38 cm Movement: Present Presentation: Vertex  No results found for this or any previous visit (from the past 24 hour(s)).  Assessment & Plan:  1) Low-risk pregnancy [id number] at [redacted]w[redacted]d with an Estimated Date of Delivery: 10/19/17    Meds: No orders of the defined types were placed in this encounter.  Labs/procedures today: sve  Plan:  Continue routine obstetrical care   Reviewed: Term labor symptoms and  general obstetric precautions including but not limited to vaginal bleeding, contractions, leaking of fluid and fetal movement were reviewed in detail with the patient.  All questions were answered  Follow-up: Return in about 1 week (around 10/14/2017) for [place].  Orders Placed This Encounter  Procedures  . POCT urinalysis dipstick   [name] CNM, [facility] 10/07/2017 8:58 AM

## 2017-10-14 ENCOUNTER — Encounter: Payer: Self-pay | Admitting: Women's Health

## 2017-10-14 ENCOUNTER — Ambulatory Visit (INDEPENDENT_AMBULATORY_CARE_PROVIDER_SITE_OTHER): Payer: PRIVATE HEALTH INSURANCE | Admitting: Women's Health

## 2017-10-14 VITALS — BP 108/71 | HR 102 | Wt 193.4 lb

## 2017-10-14 DIAGNOSIS — Z3483 Encounter for supervision of other normal pregnancy, third trimester: Secondary | ICD-10-CM

## 2017-10-14 DIAGNOSIS — O480 Post-term pregnancy: Secondary | ICD-10-CM

## 2017-10-14 DIAGNOSIS — Z331 Pregnant state, incidental: Secondary | ICD-10-CM

## 2017-10-14 DIAGNOSIS — O2243 Hemorrhoids in pregnancy, third trimester: Secondary | ICD-10-CM

## 2017-10-14 DIAGNOSIS — Z1389 Encounter for screening for other disorder: Secondary | ICD-10-CM

## 2017-10-14 DIAGNOSIS — Z3A39 39 weeks gestation of pregnancy: Secondary | ICD-10-CM

## 2017-10-14 LAB — POCT URINALYSIS DIPSTICK OB
GLUCOSE, UA: NEGATIVE — AB
Ketones, UA: NEGATIVE
LEUKOCYTES UA: NEGATIVE
Nitrite, UA: NEGATIVE
RBC UA: NEGATIVE

## 2017-10-14 NOTE — Patient Instructions (Signed)
Harrington Challenger, I greatly value your feedback.  If you receive a survey following your visit with [place] today, we appreciate you taking the time to fill it out.  Thanks, Knute Neu, CNM, WHNP-BC   Call the office [phone]) or go to [facility] if:  You begin to have strong, frequent contractions  Your water breaks.  Sometimes it is a big gush of fluid, sometimes it is just a trickle that keeps getting your panties wet or running down your legs  You have vaginal bleeding.  It is normal to have a small amount of spotting if your cervix was checked.   You don't feel your baby moving like normal.  If you don't, get you something to eat and drink and lay down and focus on feeling your baby move.  You should feel at least 10 movements in 2 hours.  If you don't, you should call the office or go to [place] Contractions Contractions of the uterus can occur throughout pregnancy, but they are not always a sign that you are in labor. You may have practice contractions called Braxton Hicks contractions. These false labor contractions are sometimes confused with true labor. What are [place] contractions? Braxton Hicks contractions are tightening movements that occur in the muscles of the uterus before labor. Unlike true labor contractions, these contractions do not result in opening (dilation) and thinning of the cervix. Toward the end of pregnancy (32-34 weeks), Braxton Hicks contractions can happen more often and may become stronger. These contractions are sometimes difficult to tell apart from true labor because they can be very uncomfortable. You should not feel embarrassed if you go to the hospital with false labor. Sometimes, the only way to tell if you are in true labor is for your health care provider to look for changes in the cervix. The health care provider will do a physical exam and may monitor your contractions. If you are not in true labor, the exam should  show that your cervix is not dilating and your water has not broken. If there are other health problems associated with your pregnancy, it is completely safe for you to be sent home with false labor. You may continue to have Braxton Hicks contractions until you go into true labor. How to tell the difference between true labor and false labor True labor  Contractions last 30-70 seconds.  Contractions become very regular.  Discomfort is usually felt in the top of the uterus, and it spreads to the lower abdomen and low back.  Contractions do not go away with walking.  Contractions usually become more intense and increase in frequency.  The cervix dilates and gets thinner. False labor  Contractions are usually shorter and not as strong as true labor contractions.  Contractions are usually irregular.  Contractions are often felt in the front of the lower abdomen and in the groin.  Contractions may go away when you walk around or change positions while lying down.  Contractions get weaker and are shorter-lasting as time goes on.  The cervix usually does not dilate or become thin. Follow these instructions at home:  Take over-the-counter and prescription medicines only as told by your health care provider.  Keep up with your usual exercises and follow other instructions from your health care provider.  Eat and drink lightly if you think you are going into labor.  If Braxton Hicks contractions are making you uncomfortable: ? Change your position from lying down  or resting to walking, or change from walking to resting. ? Sit and rest in a tub of warm water. ? Drink enough fluid to keep your urine pale yellow. Dehydration may cause these contractions. ? Do slow and deep breathing several times an hour.  Keep all follow-up prenatal visits as told by your health care provider. This is important. Contact a health care provider if:  You have a fever.  You have continuous pain in  your abdomen. Get help right away if:  Your contractions become stronger, more regular, and closer together.  You have fluid leaking or gushing from your vagina.  You pass blood-tinged mucus (bloody show).  You have bleeding from your vagina.  You have low back pain that you never had before.  You feel your baby's head pushing down and causing pelvic pressure.  Your baby is not moving inside you as much as it used to. Summary  Contractions that occur before labor are called Braxton Hicks contractions, false labor, or practice contractions.  Braxton Hicks contractions are usually shorter, weaker, farther apart, and less regular than true labor contractions. True labor contractions usually become progressively stronger and regular and they become more frequent.  Manage discomfort from [facility] contractions by changing position, resting in a warm bath, drinking plenty of water, or practicing deep breathing. This information is not intended to replace advice given to you by your health care provider. Make sure you discuss any questions you have with your health care provider. Document Released: 07/26/2016 Document Revised: 07/26/2016 Document Reviewed: 07/26/2016 Elsevier Interactive Patient Education  2018 [employer].

## 2017-10-14 NOTE — Progress Notes (Signed)
   LOW-RISK PREGNANCY VISIT Patient name: Emily Chan MRN [id number]  Date of birth: 04-Jan-1981 Chief Complaint:   low risk ob (blood in stool for last 2 days, not sure from hemorriods)  History of Present Illness:   Emily Chan is a 37 y.o. [phone] female at [redacted]w[redacted]d with an Estimated Date of Delivery: 10/19/17 being seen today for ongoing management of a low-risk pregnancy.  Today she reports bright red bleeding w/ bm's x 2 days, small amt. . Contractions: Not present.  .  Movement: Present. denies leaking of fluid. Review of Systems:   Pertinent items are noted in HPI Denies abnormal vaginal discharge w/ itching/odor/irritation, headaches, visual changes, shortness of breath, chest pain, abdominal pain, severe nausea/vomiting, or problems with urination or bowel movements unless otherwise stated above. Pertinent History Reviewed:  Reviewed past medical,surgical, social, obstetrical and family history.  Reviewed problem list, medications and allergies. Physical Assessment:   Vitals:   10/14/17 1341  BP: 108/71  Pulse: (!) 102  Weight: 193 lb 6.4 oz (87.7 kg)  Body mass index is 34.26 kg/m.        Physical Examination:   General appearance: Well appearing, and in no distress  Mental status: Alert, oriented to person, place, and time  Skin: Warm & dry  Cardiovascular: Normal heart rate noted  Respiratory: Normal respiratory effort, no distress  Abdomen: Soft, gravid, nontender  Pelvic: Cervical exam performed  Dilation: 1.5 Effacement (%): 50 Station: -2   Rectal: small non-thrombosed external hemorrhoids  Extremities: Edema: None  Fetal Status: Fetal Heart Rate (bpm): 130 Fundal Height: 39 cm Movement: Present Presentation: Vertex  Results for orders placed or performed in visit on 10/14/17 (from the past 24 hour(s))  POC Urinalysis Dipstick OB   Collection Time: 10/14/17  1:46 PM  Result Value Ref Range   Color, UA     Clarity, UA     Glucose, UA Negative (A) (none)   Bilirubin, UA     Ketones, UA neg    Spec Grav, UA  1.010 - 1.025   Blood, UA neg    pH, UA  5.0 - 8.0   POC Protein UA Small (1+) (A) Negative, Trace   Urobilinogen, UA  0.2 or 1.0 E.U./dL   Nitrite, UA neg    Leukocytes, UA Negative Negative   Appearance     Odor      Assessment & Plan:  1) Low-risk pregnancy [id number] at [redacted]w[redacted]d with an Estimated Date of Delivery: 10/19/17   2) Hemorrhoids w/ small amt bleeding, continue anusol, prevent constipation   Meds: No orders of the defined types were placed in this encounter.  Labs/procedures today: sve  Plan:  Continue routine obstetrical care   Reviewed: Term labor symptoms and general obstetric precautions including but not limited to vaginal bleeding, contractions, leaking of fluid and fetal movement were reviewed in detail with the patient.  All questions were answered  Follow-up: Return in about 1 week (around 10/21/2017) for LROB w/ bpp u/s.  Orders Placed This Encounter  Procedures  . US FETAL BPP WO NON STRESS  . POC Urinalysis Dipstick OB   [name] CNM, [facility] 10/14/2017 2:08 PM

## 2017-10-21 ENCOUNTER — Telehealth: Payer: Self-pay | Admitting: Women's Health

## 2017-10-21 NOTE — Telephone Encounter (Signed)
Patient called stating she has decreased fetal movement but is still getting 10 movements in 2 hours.  Baby is active just "not as much since Saturday." says she has had intermittent periods of contractions that started on Saturday but have since stopped.  When that baby stopped moving or slowed, says she drinks water or juice and that baby starts moving.  Advised patient she could have NST in our office or go to [facility].  Pt stated she was in [place] and would call back.

## 2017-10-21 NOTE — Telephone Encounter (Signed)
Pt is 40 weeks has appt tomorrow/ baby is not moving as much/ not like normal/ please call pt and advise if we need to work her in today or okay til appt tomorrow

## 2017-10-22 ENCOUNTER — Inpatient Hospital Stay (HOSPITAL_COMMUNITY)
Admission: AD | Admit: 2017-10-22 | Discharge: 2017-10-25 | DRG: 807 | Disposition: A | Discharge status: Home / Self Care | Payer: PRIVATE HEALTH INSURANCE | Attending: Obstetrics and Gynecology | Admitting: Obstetrics and Gynecology

## 2017-10-22 ENCOUNTER — Ambulatory Visit (INDEPENDENT_AMBULATORY_CARE_PROVIDER_SITE_OTHER): Payer: PRIVATE HEALTH INSURANCE | Admitting: Obstetrics & Gynecology

## 2017-10-22 ENCOUNTER — Ambulatory Visit (INDEPENDENT_AMBULATORY_CARE_PROVIDER_SITE_OTHER): Payer: PRIVATE HEALTH INSURANCE

## 2017-10-22 ENCOUNTER — Encounter: Payer: Self-pay | Admitting: Obstetrics & Gynecology

## 2017-10-22 ENCOUNTER — Encounter (HOSPITAL_COMMUNITY): Payer: Self-pay | Admitting: *Deleted

## 2017-10-22 ENCOUNTER — Other Ambulatory Visit: Payer: Self-pay

## 2017-10-22 ENCOUNTER — Telehealth (HOSPITAL_COMMUNITY): Payer: Self-pay | Admitting: *Deleted

## 2017-10-22 VITALS — BP 122/81 | HR 76 | Wt 199.0 lb

## 2017-10-22 DIAGNOSIS — O358XX0 Maternal care for other (suspected) fetal abnormality and damage, not applicable or unspecified: Principal | ICD-10-CM | POA: Diagnosis present

## 2017-10-22 DIAGNOSIS — O09899 Supervision of other high risk pregnancies, unspecified trimester: Secondary | ICD-10-CM

## 2017-10-22 DIAGNOSIS — Z3483 Encounter for supervision of other normal pregnancy, third trimester: Secondary | ICD-10-CM

## 2017-10-22 DIAGNOSIS — Z3A40 40 weeks gestation of pregnancy: Secondary | ICD-10-CM | POA: Diagnosis not present

## 2017-10-22 DIAGNOSIS — Z1389 Encounter for screening for other disorder: Secondary | ICD-10-CM

## 2017-10-22 DIAGNOSIS — O480 Post-term pregnancy: Secondary | ICD-10-CM

## 2017-10-22 DIAGNOSIS — O09219 Supervision of pregnancy with history of pre-term labor, unspecified trimester: Secondary | ICD-10-CM

## 2017-10-22 DIAGNOSIS — Z331 Pregnant state, incidental: Secondary | ICD-10-CM

## 2017-10-22 DIAGNOSIS — E669 Obesity, unspecified: Secondary | ICD-10-CM | POA: Diagnosis present

## 2017-10-22 DIAGNOSIS — O9089 Other complications of the puerperium, not elsewhere classified: Secondary | ICD-10-CM | POA: Diagnosis present

## 2017-10-22 DIAGNOSIS — O99214 Obesity complicating childbirth: Secondary | ICD-10-CM | POA: Diagnosis present

## 2017-10-22 DIAGNOSIS — M542 Cervicalgia: Secondary | ICD-10-CM | POA: Diagnosis present

## 2017-10-22 DIAGNOSIS — Z141 Cystic fibrosis carrier: Secondary | ICD-10-CM

## 2017-10-22 LAB — POCT URINALYSIS DIPSTICK OB
Blood, UA: NEGATIVE
Glucose, UA: NEGATIVE — AB
KETONES UA: NEGATIVE
Leukocytes, UA: NEGATIVE
NITRITE UA: NEGATIVE

## 2017-10-22 NOTE — Progress Notes (Signed)
[id number] [redacted]w[redacted]d Estimated Date of Delivery: 10/19/17  Blood pressure 122/81, pulse 76, weight 199 lb (90.3 kg), last menstrual period 01/12/2017.   BP weight and urine results all reviewed and noted.  Please refer to the obstetrical flow sheet for the fundal height and fetal heart rate documentation:  Patient reports good fetal movement, denies any bleeding and no rupture of membranes symptoms or regular contractions. Patient is without complaints. All questions were answered.  Orders Placed This Encounter  Procedures  . POC Urinalysis Dipstick OB    Plan:  Continued routine obstetrical care, cx 2/20/-3/vtx/soft/post  Return in about 6 weeks (around 12/03/2017) for post partum visit.

## 2017-10-22 NOTE — Progress Notes (Addendum)
[place] 22+5 wks,cephalic,anterior pl gr 2,afi 16 cm,normal ovaries bilat,bilat hydronephrosis,right renal pelvis 12.4 mm,left renal pelvis 13.1 mm,ureters were not visualized,FHR 146 bpm,BPP 8/8

## 2017-10-22 NOTE — Telephone Encounter (Signed)
Preadmission screen  

## 2017-10-22 NOTE — Treatment Plan (Signed)
   Induction Assessment Scheduling Form: Fax to [facility]:  [phone]  Jeanann Balinski                                                                                   DOB:  July 08, 1980                                                            MRN:  [id number]                                                                     Phone #:      [phone]                      Provider:  [facility]  GP:  [id number]                                                            Estimated Date of Delivery: 10/19/17  Dating Criteria: LMP + 7 week sonogram    Medical Indications for induction:  Post dates Admission Date/Time:  10/27/2017 0730 Gestational age on admission:  [redacted]w[redacted]d   Filed Weights   10/22/17 1333  Weight: 199 lb (90.3 kg)   HIV:  Non Reactive (04/22 0929) GYB:WLSLHTDS    2 cm dilated, 20 effaced, -2 station, cervical position post, consistency soft, Bishop score 6, presenting part vertex   Method of induction(proposed):  choice   Scheduling Provider Signature:  Florian Buff, MD                                            Today's Date:  10/22/2017

## 2017-10-22 NOTE — MAU Note (Signed)
Pt states she had a membrane sweep today and started cramping around 5-6pm and has gotten worse since. States she is now having contractions 5-10. Pt reports some spotting. Denies LOF. Reports good fetal movement. Cervix was 2cm.

## 2017-10-23 ENCOUNTER — Inpatient Hospital Stay (HOSPITAL_COMMUNITY): Payer: PRIVATE HEALTH INSURANCE | Admitting: Anesthesiology

## 2017-10-23 ENCOUNTER — Other Ambulatory Visit: Payer: Self-pay

## 2017-10-23 ENCOUNTER — Encounter (HOSPITAL_COMMUNITY): Payer: Self-pay

## 2017-10-23 DIAGNOSIS — Z3483 Encounter for supervision of other normal pregnancy, third trimester: Secondary | ICD-10-CM | POA: Diagnosis present

## 2017-10-23 DIAGNOSIS — M542 Cervicalgia: Secondary | ICD-10-CM | POA: Diagnosis present

## 2017-10-23 DIAGNOSIS — O480 Post-term pregnancy: Secondary | ICD-10-CM | POA: Diagnosis present

## 2017-10-23 DIAGNOSIS — Z3A40 40 weeks gestation of pregnancy: Secondary | ICD-10-CM | POA: Diagnosis not present

## 2017-10-23 DIAGNOSIS — E669 Obesity, unspecified: Secondary | ICD-10-CM | POA: Diagnosis present

## 2017-10-23 DIAGNOSIS — O99214 Obesity complicating childbirth: Secondary | ICD-10-CM | POA: Diagnosis present

## 2017-10-23 DIAGNOSIS — Z141 Cystic fibrosis carrier: Secondary | ICD-10-CM | POA: Diagnosis not present

## 2017-10-23 DIAGNOSIS — O9089 Other complications of the puerperium, not elsewhere classified: Secondary | ICD-10-CM | POA: Diagnosis present

## 2017-10-23 DIAGNOSIS — O358XX0 Maternal care for other (suspected) fetal abnormality and damage, not applicable or unspecified: Secondary | ICD-10-CM | POA: Diagnosis present

## 2017-10-23 LAB — CBC
HCT: 38.5 % (ref 36.0–46.0)
HEMOGLOBIN: 13.8 g/dL (ref 12.0–15.0)
MCH: 32 pg (ref 26.0–34.0)
MCHC: 35.8 g/dL (ref 30.0–36.0)
MCV: 89.3 fL (ref 78.0–100.0)
Platelets: 134 10*3/uL — ABNORMAL LOW (ref 150–400)
RBC: 4.31 MIL/uL (ref 3.87–5.11)
RDW: 13.4 % (ref 11.5–15.5)
WBC: 13.2 10*3/uL — ABNORMAL HIGH (ref 4.0–10.5)

## 2017-10-23 LAB — TYPE AND SCREEN
ABO/RH(D): O POS
ANTIBODY SCREEN: NEGATIVE

## 2017-10-23 LAB — RPR: RPR Ser Ql: NONREACTIVE

## 2017-10-23 MED ORDER — EPHEDRINE 5 MG/ML INJ
10.0000 mg | INTRAVENOUS | Status: DC | PRN
  Filled 2017-10-23: qty 2

## 2017-10-23 MED ORDER — LACTATED RINGERS IV SOLN: 500.0000 mL | INTRAVENOUS | Status: DC | PRN

## 2017-10-23 MED ORDER — IBUPROFEN 600 MG PO TABS
600.0000 mg | ORAL_TABLET | Freq: Four times a day (QID) | ORAL | Status: DC
  Administered 2017-10-23 – 2017-10-25 (×7): 600 mg via ORAL
  Filled 2017-10-23 (×7): qty 1

## 2017-10-23 MED ORDER — LIDOCAINE HCL (PF) 1 % IJ SOLN
INTRAMUSCULAR | Status: DC | PRN
  Administered 2017-10-23 (×2): 4 mL via EPIDURAL

## 2017-10-23 MED ORDER — PHENYLEPHRINE 40 MCG/ML (10ML) SYRINGE FOR IV PUSH (FOR BLOOD PRESSURE SUPPORT)
PREFILLED_SYRINGE | INTRAVENOUS | Status: AC
  Filled 2017-10-23: qty 20

## 2017-10-23 MED ORDER — LACTATED RINGERS IV SOLN
500.0000 mL | Freq: Once | INTRAVENOUS | Status: AC
  Administered 2017-10-23: 500 mL via INTRAVENOUS

## 2017-10-23 MED ORDER — OXYTOCIN 40 UNITS IN LACTATED RINGERS INFUSION - SIMPLE MED
2.5000 [IU]/h | INTRAVENOUS | Status: DC
  Filled 2017-10-23: qty 1000

## 2017-10-23 MED ORDER — ONDANSETRON HCL 4 MG PO TABS: 4.0000 mg | ORAL_TABLET | ORAL | Status: DC | PRN

## 2017-10-23 MED ORDER — FLEET ENEMA 7-19 GM/118ML RE ENEM: 1.0000 | ENEMA | RECTAL | Status: DC | PRN

## 2017-10-23 MED ORDER — IBUPROFEN 600 MG PO TABS
600.0000 mg | ORAL_TABLET | Freq: Four times a day (QID) | ORAL | Status: DC | PRN
  Administered 2017-10-23: 600 mg via ORAL
  Filled 2017-10-23: qty 1

## 2017-10-23 MED ORDER — ONDANSETRON HCL 4 MG/2ML IJ SOLN: 4.0000 mg | INTRAMUSCULAR | Status: DC | PRN

## 2017-10-23 MED ORDER — DIPHENHYDRAMINE HCL 50 MG/ML IJ SOLN: 12.5000 mg | INTRAMUSCULAR | Status: DC | PRN

## 2017-10-23 MED ORDER — LACTATED RINGERS IV SOLN
INTRAVENOUS | Status: DC
  Administered 2017-10-23: via INTRAVENOUS

## 2017-10-23 MED ORDER — HYDROXYZINE HCL 50 MG PO TABS
50.0000 mg | ORAL_TABLET | Freq: Four times a day (QID) | ORAL | Status: DC | PRN
  Filled 2017-10-23: qty 1

## 2017-10-23 MED ORDER — FENTANYL 2.5 MCG/ML BUPIVACAINE 1/10 % EPIDURAL INFUSION (WH - ANES)
14.0000 mL/h | INTRAMUSCULAR | Status: DC | PRN
  Administered 2017-10-23: 14 mL/h via EPIDURAL
  Filled 2017-10-23: qty 100

## 2017-10-23 MED ORDER — PHENYLEPHRINE 40 MCG/ML (10ML) SYRINGE FOR IV PUSH (FOR BLOOD PRESSURE SUPPORT)
80.0000 ug | PREFILLED_SYRINGE | INTRAVENOUS | Status: DC | PRN
  Filled 2017-10-23: qty 5

## 2017-10-23 MED ORDER — ZOLPIDEM TARTRATE 5 MG PO TABS: 5.0000 mg | ORAL_TABLET | Freq: Every evening | ORAL | Status: DC | PRN

## 2017-10-23 MED ORDER — ACETAMINOPHEN 325 MG PO TABS
650.0000 mg | ORAL_TABLET | ORAL | Status: DC | PRN
  Administered 2017-10-24: 650 mg via ORAL
  Filled 2017-10-23: qty 2

## 2017-10-23 MED ORDER — LIDOCAINE HCL (PF) 1 % IJ SOLN
30.0000 mL | INTRAMUSCULAR | Status: DC | PRN
  Filled 2017-10-23: qty 30

## 2017-10-23 MED ORDER — SENNOSIDES-DOCUSATE SODIUM 8.6-50 MG PO TABS
2.0000 | ORAL_TABLET | ORAL | Status: DC
  Administered 2017-10-23 – 2017-10-24 (×2): 2 via ORAL
  Filled 2017-10-23 (×2): qty 2

## 2017-10-23 MED ORDER — ACETAMINOPHEN 325 MG PO TABS: 650.0000 mg | ORAL_TABLET | ORAL | Status: DC | PRN

## 2017-10-23 MED ORDER — ONDANSETRON HCL 4 MG/2ML IJ SOLN
4.0000 mg | Freq: Four times a day (QID) | INTRAMUSCULAR | Status: DC | PRN
  Administered 2017-10-23 (×2): 4 mg via INTRAVENOUS
  Filled 2017-10-23 (×3): qty 2

## 2017-10-23 MED ORDER — PRENATAL MULTIVITAMIN CH
1.0000 | ORAL_TABLET | Freq: Every day | ORAL | Status: DC
  Administered 2017-10-24: 1 via ORAL
  Filled 2017-10-23: qty 1

## 2017-10-23 MED ORDER — SIMETHICONE 80 MG PO CHEW: 80.0000 mg | CHEWABLE_TABLET | ORAL | Status: DC | PRN

## 2017-10-23 MED ORDER — LACTATED RINGERS IV SOLN: 500.0000 mL | Freq: Once | INTRAVENOUS | Status: AC

## 2017-10-23 MED ORDER — OXYTOCIN BOLUS FROM INFUSION
500.0000 mL | Freq: Once | INTRAVENOUS | Status: AC
  Administered 2017-10-23: 500 mL/h via INTRAVENOUS

## 2017-10-23 MED ORDER — COCONUT OIL OIL
1.0000 "application " | TOPICAL_OIL | Status: DC | PRN
  Filled 2017-10-23: qty 120

## 2017-10-23 MED ORDER — WITCH HAZEL-GLYCERIN EX PADS: 1.0000 "application " | MEDICATED_PAD | CUTANEOUS | Status: DC | PRN

## 2017-10-23 MED ORDER — DIBUCAINE 1 % RE OINT: 1.0000 "application " | TOPICAL_OINTMENT | RECTAL | Status: DC | PRN

## 2017-10-23 MED ORDER — OXYCODONE-ACETAMINOPHEN 5-325 MG PO TABS: 1.0000 | ORAL_TABLET | ORAL | Status: DC | PRN

## 2017-10-23 MED ORDER — TETANUS-DIPHTH-ACELL PERTUSSIS 5-2.5-18.5 LF-MCG/0.5 IM SUSP: 0.5000 mL | Freq: Once | INTRAMUSCULAR | Status: DC

## 2017-10-23 MED ORDER — SOD CITRATE-CITRIC ACID 500-334 MG/5ML PO SOLN
30.0000 mL | ORAL | Status: DC | PRN
  Administered 2017-10-23: 30 mL via ORAL
  Filled 2017-10-23: qty 15

## 2017-10-23 MED ORDER — DIPHENHYDRAMINE HCL 25 MG PO CAPS: 25.0000 mg | ORAL_CAPSULE | Freq: Four times a day (QID) | ORAL | Status: DC | PRN

## 2017-10-23 MED ORDER — OXYCODONE-ACETAMINOPHEN 5-325 MG PO TABS: 2.0000 | ORAL_TABLET | ORAL | Status: DC | PRN

## 2017-10-23 MED ORDER — BENZOCAINE-MENTHOL 20-0.5 % EX AERO
1.0000 "application " | INHALATION_SPRAY | CUTANEOUS | Status: DC | PRN
  Administered 2017-10-24: 1 via TOPICAL
  Filled 2017-10-23: qty 56

## 2017-10-23 MED ORDER — FENTANYL 2.5 MCG/ML BUPIVACAINE 1/10 % EPIDURAL INFUSION (WH - ANES)
INTRAMUSCULAR | Status: AC
  Filled 2017-10-23: qty 100

## 2017-10-23 MED ORDER — FENTANYL CITRATE (PF) 100 MCG/2ML IJ SOLN: 50.0000 ug | INTRAMUSCULAR | Status: DC | PRN

## 2017-10-23 NOTE — Progress Notes (Signed)
Patient ID: Nashika Coker, female   DOB: [birth year], 37 y.o.   MRN: [id number] Nikiyah Fackler is a 37 y.o. [phone] at [redacted]w[redacted]d admitted for active labor  Subjective: Comfortable w/ epidural  Objective: BP 107/70   Pulse 97   Temp 98.2 F (36.8 C) (Oral)   Resp 14   Ht 5\' 3"  (1.6 m)   Wt 90.3 kg (199 lb)   LMP 01/12/2017 (Exact Date)   SpO2 97%   BMI 35.25 kg/m  No intake/output data recorded.  FHT:  FHR: 145 bpm, variability: minimal ,  accelerations:  Abscent,  decelerations:  Present none UC:   regular, every 2-3 minutes  SVE:   7/80/-3  Labs: Lab Results  Component Value Date   WBC 13.2 (H) 10/23/2017   HGB 13.8 10/23/2017   HCT 38.5 10/23/2017   MCV 89.3 10/23/2017   PLT 134 (L) 10/23/2017    Assessment / Plan: Spontaneous labor, progressing normally  Labor: Progressing normally Fetal Wellbeing:  Category II Pain Control:  Epidural Pre-eclampsia: n/a I/D:  n/a Anticipated MOD:  NSVD  [name] CNM, WHNP-BC 10/23/2017, 0230

## 2017-10-23 NOTE — Progress Notes (Signed)
Patient ID: Emily Chan, female   DOB: 1980-09-05, 37 y.o.   MRN: [id number] Emily Chan is a 37 y.o. [phone] at [redacted]w[redacted]d admitted for active labor  Subjective: Reports neck pain  Objective: Temp 98.7, HR 92, RR 16, BP 98/64 FHT:  FHR: 155 bpm, variability: moderate,  accelerations:  Present,  decelerations:  Present variables, after SVE, turned to Lt lateral w/ peanut ball, variables still present but very mild UC:   q 2-64mins  SVE:   9.5/100/0  Labs: Lab Results  Component Value Date   WBC 13.2 (H) 10/23/2017   HGB 13.8 10/23/2017   HCT 38.5 10/23/2017   MCV 89.3 10/23/2017   PLT 134 (L) 10/23/2017    Assessment / Plan: Spontaneous labor, progressing normally, neck pain, changed positions, applied heating compress  Labor: Progressing normally Fetal Wellbeing:  Category II Pain Control:  Epidural Pre-eclampsia: n/a I/D:  n/a Anticipated MOD:  NSVD  [name] CNM, WHNP-BC 10/23/2017,0805

## 2017-10-23 NOTE — Progress Notes (Signed)
Patient ID: Emily Chan, female   DOB: 19-Feb-1981, 37 y.o.   MRN: [id number] Emily Chan is a 37 y.o. [phone] at [redacted]w[redacted]d admitted for active labor  Subjective: Comfortable w/ epidural, some pressure  Objective: BP 107/70   Pulse 97   Temp 98.2 F (36.8 C) (Oral)   Resp 14   Ht 5\' 3"  (1.6 m)   Wt 90.3 kg (199 lb)   LMP 01/12/2017 (Exact Date)   SpO2 97%   BMI 35.25 kg/m  No intake/output data recorded.  FHT:  FHR: 160 bpm, variability: minimal ,  accelerations:  Abscent,  decelerations:  Present late UC:   regular, every 2-3 minutes  SVE:   Dilation: 7 Effacement (%): 80 Station: -2 Exam by:: K Jahayra Mazo  AROM copious amt clear fluid, then positioned on Rt lateral, infant not tolerating, so changed to Lt lateral w/ good return to baseline, better variability, no lates-only mild variables  Labs: Lab Results  Component Value Date   WBC 13.2 (H) 10/23/2017   HGB 13.8 10/23/2017   HCT 38.5 10/23/2017   MCV 89.3 10/23/2017   PLT 134 (L) 10/23/2017    Assessment / Plan: Spontaneous labor, progressing normally, now AROM'd  Labor: Progressing normally Fetal Wellbeing:  Category II Pain Control:  Epidural Pre-eclampsia: n/a I/D:  n/a Anticipated MOD:  NSVD  [name] CNM, WHNP-BC 10/23/2017, 5:19 AM

## 2017-10-23 NOTE — Anesthesia Postprocedure Evaluation (Signed)
Anesthesia Post Note  Patient: Annastasia Haskins  Procedure(s) Performed: AN AD HOC LABOR EPIDURAL     Patient location during evaluation: Mother Baby Anesthesia Type: Epidural Level of consciousness: awake Pain management: pain level controlled Vital Signs Assessment: post-procedure vital signs reviewed and stable Respiratory status: spontaneous breathing Cardiovascular status: stable Postop Assessment: no headache, no backache, epidural receding, patient able to bend at knees, no apparent nausea or vomiting, adequate PO intake and able to ambulate Anesthetic complications: no    Last Vitals:  Vitals:   10/23/17 1402 10/23/17 1736  BP: 121/70 113/76  Pulse: 92 83  Resp:    Temp: 36.8 C 36.6 C  SpO2:      Last Pain:  Vitals:   10/23/17 1739  TempSrc:   PainSc: 2    Pain Goal: Patients Stated Pain Goal: 3 (10/23/17 0630)               Angelly Spearing

## 2017-10-23 NOTE — H&P (Signed)
Emily Chan is a 37 y.o. [phone] female at [redacted]w[redacted]d by LMP c/w 7wk u/s presenting in active labor. Membranes swept at appt today, was 2cm.   Reports active fetal movement, contractions: regular, vaginal bleeding: none, membranes: intact. Initiated prenatal care at [facility] at 10 wks.   Most recent u/s yesterday for postdates, bpp 8/8, new bilateral fetal hydronephrosis, Rt 12.64mm/Lt 13.22mm.   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 Abdominal cramping affecting pregnancy 08/24/2014  . Abnormal Pap smear   . Bleeding in early pregnancy 09/02/2014   Pelvic rest  . Contraception management 06/16/2012  . Contraceptive management 05/03/2015  . Hemorrhoids 05/03/2015  . History of abnormal cervical Pap smear 08/18/2015  . Hx: UTI (urinary tract infection)   . Miscarriage   . Numbness in both legs 08/18/2015   Has numbness and tingling in both legs since epidural, on and off esp at night  . Pregnant 01/28/2014  . Vaginal irritation 12/25/2012  . Vaginal Pap smear, abnormal   . Yeast infection 12/25/2012    Past Surgical History: Past Surgical History:  Procedure Laterality Date  . APPENDECTOMY    . WISDOM TOOTH EXTRACTION      Obstetrical History: OB History    Gravida  3   Para  1   Term      Preterm  1   AB  1   Living  1     SAB  1   TAB      Ectopic      Multiple  0   Live Births  1        Obstetric Comments  14.3wks missed ab, CRL measures 8wks        Social History: Social History   Socioeconomic History  . Marital status: Married    Spouse name: Not on file  . Number of children: Not on file  . Years of education: Not on file  . Highest education level: Not on file  Occupational History  . Not on file  Social Needs  . Financial resource strain: Not on file  . Food  insecurity:    Worry: Not on file    Inability: Not on file  . Transportation needs:    Medical: Not on file    Non-medical: Not on file  Tobacco Use  . Smoking status: Never Smoker  . Smokeless tobacco: Never Used  Substance and Sexual Activity  . Alcohol use: No    Frequency: Never    Comment: occ.  . Drug use: No  . Sexual activity: Yes    Birth control/protection: None  Lifestyle  . Physical activity:    Days per week: Not on file    Minutes per session: Not on file  . Stress: Not on file  Relationships  . Social connections:    Talks on phone: Not on file    Gets together: Not on file    Attends religious service: Not on file    Active member of club or organization: Not on file    Attends meetings of clubs or organizations: Not on file    Relationship status: Not on file  Other Topics Concern  . Not on file  Social History Narrative  . Not on file    Family History: Family History  Problem Relation Age of Onset  .  Cancer Paternal Aunt        melenoma  . Diabetes Maternal Grandmother   . Hypertension Maternal Grandmother   . Cancer Maternal Grandfather        Brain and Lung  . Diabetes Maternal Grandfather   . Cancer Paternal Grandmother        breast  . Cancer Maternal Uncle     Allergies: Allergies  Allergen Reactions  . Sulfa Antibiotics Rash    Medications Prior to Admission  Medication Sig Dispense Refill Last Dose  . aspirin EC 81 MG tablet Take 81 mg by mouth daily.   Taking  . Calcium Carbonate Antacid (TUMS PO) Take by mouth as needed.   Taking  . hydrocortisone (ANUSOL-HC) 2.5 % rectal cream Place 1 application rectally 2 (two) times daily. (Patient taking differently: Place 1 application rectally as needed. ) 30 g 0 Taking  . Prenatal Vit-Fe Fumarate-FA (MULTIVITAMIN-PRENATAL) 27-0.8 MG TABS tablet Take 1 tablet by mouth daily at 12 noon.   Taking    Review of Systems  Pertinent pos/neg as indicated in HPI  Blood pressure 137/90,  pulse (!) 102, temperature 97.7 F (36.5 C), temperature source Oral, resp. rate 18, height 5\' 3"  (1.6 m), weight 90.3 kg (199 lb), last menstrual period 01/12/2017, SpO2 98 %. General appearance: alert, cooperative and mild distress Lungs: clear to auscultation bilaterally Heart: regular rate and rhythm Abdomen: gravid, soft, non-tender Extremities: t edema DTR's 2+  Fetal monitoring: FHR: 135 bpm, variability: moderate,  Accelerations: Present,  decelerations:  Absent Uterine activity: q 2-23mins Dilation: 6 Effacement (%): 80 Station: Ballotable Exam by:: Jeanette Caprice, RN 2 Presentation: cephalic   Prenatal labs: ABO, Rh: O/Positive/-- (01/03 1542) Antibody: Negative (04/22 0929) Rubella: 2.57 (01/03 1542) RPR: Non Reactive (04/22 0929)  HBsAg: Negative (01/03 1542)  HIV: Non Reactive (04/22 0929)  GBS:   neg  2hr GTT: 79/110/95 Genetic screening:  declined Anatomy US: normal female, new finding of bilateral hydronephrosis on u/s today, Rt 12.23mm/Lt 13.8mm.   Results for orders placed or performed in visit on 10/22/17 (from the past 24 hour(s))  POC Urinalysis Dipstick OB   Collection Time: 10/22/17  1:38 PM  Result Value Ref Range   Color, UA     Clarity, UA     Glucose, UA Negative (A) (none)   Bilirubin, UA     Ketones, UA neg    Spec Grav, UA  1.010 - 1.025   Blood, UA neg    pH, UA  5.0 - 8.0   POC Protein UA Trace Negative, Trace   Urobilinogen, UA  0.2 or 1.0 E.U./dL   Nitrite, UA neg    Leukocytes, UA Negative Negative   Appearance     Odor       Assessment:  [redacted]w[redacted]d SIUP  [id number]  Active labor  Cat 1 FHR  GBS  neg  Plan:  Admit to [facility]  IV pain meds/epidural prn active labor  Expectant management  Anticipate NSVB   Plans to breastfeed  Contraception: POPs/vasectomy  Circumcision: yes  [name] CNM, WHNP-BC 10/23/2017, 12:15 AM

## 2017-10-23 NOTE — Lactation Note (Addendum)
This note was copied from a baby's chart. Lactation Consultation Note  Patient Name: Emily Chan HFGBM'S Date: 10/23/2017 Reason for consult: Initial assessment;1st time breastfeeding;Term  P2 mother whose infant is now 13 hours old.  Mother did not breastfeed her first child.    Baby sleeping in bassinet as I arrived.  Mother stated that she fed him right after delivery but he has not been interested since that time.  She tried again an hour ago.  Encouraged her to feed 8-12 times/24 hours or sooner if he shows feeding cues.  Reviewed feeding cues.  Explained the importance of STS and hand expression before and after feedings.  Provided colostrum container and spoon for any EBM mother may obtain with hand expression.  She will call for latch assistance as needed and I suggested an RN/LC observe at least once every 8 hours.  Mother verbalized understanding of feeding plan and will call.    Mom made aware of O/P services, breastfeeding support groups, community resources, and our phone # for post-discharge questions. Father present.   Maternal Data Formula Feeding for Exclusion: No Has patient been taught Hand Expression?: Yes Does the patient have breastfeeding experience prior to this delivery?: No  Feeding Feeding Type: Breast Fed Length of feed: 0 min  LATCH Score                   Interventions Interventions: Breast feeding basics reviewed;Assisted with latch;Skin to skin;Breast massage;Hand express  Lactation Tools Discussed/Used WIC Program: No   Consult Status Consult Status: Follow-up Date: 10/24/17 Follow-up type: In-patient    Little Ishikawa 10/23/2017, 4:57 PM

## 2017-10-23 NOTE — Anesthesia Preprocedure Evaluation (Signed)
Anesthesia Evaluation  Patient identified by MRN, date of birth, ID band Patient awake    Reviewed: Allergy & Precautions, Patient's Chart, lab work & pertinent test results  Airway Mallampati: II  TM Distance: >3 FB Neck ROM: Full    Dental no notable dental hx. (+) Teeth Intact   Pulmonary neg pulmonary ROS,    Pulmonary exam normal breath sounds clear to auscultation       Cardiovascular negative cardio ROS Normal cardiovascular exam Rhythm:Regular Rate:Normal     Neuro/Psych Has intermittent numbness and tingling both legs since last delivery negative psych ROS   GI/Hepatic Neg liver ROS, GERD  Medicated and Controlled,  Endo/Other  Obesity  Renal/GU negative Renal ROS  negative genitourinary   Musculoskeletal negative musculoskeletal ROS (+)   Abdominal (+) + obese,   Peds  Hematology Thrombocytopenia- mild   Anesthesia Other Findings   Reproductive/Obstetrics (+) Pregnancy                             Anesthesia Physical Anesthesia Plan  ASA: II  Anesthesia Plan: Epidural   Post-op Pain Management:    Induction:   PONV Risk Score and Plan:   Airway Management Planned: Natural Airway  Additional Equipment:   Intra-op Plan:   Post-operative Plan:   Informed Consent: I have reviewed the patients History and Physical, chart, labs and discussed the procedure including the risks, benefits and alternatives for the proposed anesthesia with the patient or authorized representative who has indicated his/her understanding and acceptance.     Plan Discussed with: Surgeon and CRNA  Anesthesia Plan Comments:         Anesthesia Quick Evaluation

## 2017-10-23 NOTE — MAU Note (Signed)
Pt. States she had membranes swept in office. States ctx started to get worse @ 9:30. Denies leaking. Has bloody show.

## 2017-10-23 NOTE — Anesthesia Procedure Notes (Signed)
Epidural Patient location during procedure: OB Start time: 10/23/2017 1:10 AM  Staffing Anesthesiologist: Josephine Igo, MD Performed: anesthesiologist   Preanesthetic Checklist Completed: patient identified, site marked, surgical consent, pre-op evaluation, timeout performed, IV checked, risks and benefits discussed and monitors and equipment checked  Epidural Patient position: sitting Prep: site prepped and draped and DuraPrep Patient monitoring: continuous pulse ox and blood pressure Approach: midline Location: L3-L4 Injection technique: LOR air  Needle:  Needle type: Tuohy  Needle gauge: 17 G Needle length: 9 cm and 9 Needle insertion depth: 5 cm cm Catheter type: closed end flexible Catheter size: 19 Gauge Catheter at skin depth: 10 cm Test dose: negative and Other  Assessment Events: blood not aspirated, injection not painful, no injection resistance, negative IV test and no paresthesia  Additional Notes Patient identified. Risks and benefits discussed including failed block, incomplete  Pain control, post dural puncture headache, nerve damage, paralysis, blood pressure Changes, nausea, vomiting, reactions to medications-both toxic and allergic and post Partum back pain. All questions were answered. Patient expressed understanding and wished to proceed. Sterile technique was used throughout procedure. Epidural site was Dressed with sterile barrier dressing. No paresthesias, signs of intravascular injection Or signs of intrathecal spread were encountered.  Patient was more comfortable after the epidural was dosed. Please see RN's note for documentation of vital signs and FHR which are stable.

## 2017-10-24 NOTE — Lactation Note (Signed)
This note was copied from a baby's chart.  Lactation Consultation Note  Patient Name: Emily Chan [id number] Date: 10/24/2017 Reason for consult: Follow-up assessment  P2 mother whose infant is now 84 hours old.  Family had wanted an early discharge but baby has had some questionable emesis, per mom, so they will be staying until tomorrow.  Mother was breastfeeding as I arrived.  I assessed the latch to be good with flanged lips, active sucking and good deep latch.  Mother had no pain with latching and no assistance was needed.  Per mom, her breasts are soft and non tender and just slight nipple discomfort.  Comfort gels provided with instructions for use.  While talking with mom, a tech came in to take baby to the nursery for a procedure so mother will finish breastfeeding when he returns.  Encouraged continued STS, breast massage and hand expression after feedings.  Mother will call for assistance as needed.  Father present and supportive.   Maternal Data Formula Feeding for Exclusion: No Has patient been taught Hand Expression?: Yes  Feeding Feeding Type: Breast Fed Length of feed: 5 min(feeding when I arrived )  LATCH Score Latch: Grasps breast easily, tongue down, lips flanged, rhythmical sucking.  Audible Swallowing: Spontaneous and intermittent  Type of Nipple: Everted at rest and after stimulation  Comfort (Breast/Nipple): Soft / non-tender  Hold (Positioning): No assistance needed to correctly position infant at breast.  LATCH Score: 10  Interventions Interventions: Breast feeding basics reviewed;Skin to skin;Breast massage  Lactation Tools Discussed/Used     Consult Status Consult Status: Follow-up Date: 10/25/17 Follow-up type: In-patient    Shametra Cumberland R Drayven Marchena 10/24/2017, 10:01 AM

## 2017-10-24 NOTE — Progress Notes (Signed)
Post Partum Day #1 Subjective: no complaints, up ad lib and tolerating PO; breastfeeding going well; pt concerned re infant spitting up 'green'- had MSF; plans on POPs/vasectomy  Objective: Blood pressure 108/62, pulse 72, temperature 98.1 F (36.7 C), temperature source Oral, resp. rate 16, height 5\' 3"  (1.6 m), weight 90.3 kg (199 lb), last menstrual period 01/12/2017, SpO2 98 %, unknown if currently breastfeeding.  Physical Exam:  General: alert, cooperative and no distress Lochia: appropriate Uterine Fundus: firm DVT Evaluation: No evidence of DVT seen on physical exam.  Recent Labs    10/23/17 0030  HGB 13.8  HCT 38.5    Assessment/Plan: Plan for discharge tomorrow unless peds clears infant for d/c today   LOS: 1 day   Serita Grammes CNM 10/24/2017, 9:55 AM

## 2017-10-25 MED ORDER — IBUPROFEN 600 MG PO TABS: 600.0000 mg | ORAL_TABLET | Freq: Four times a day (QID) | ORAL | 0 refills | Status: DC

## 2017-10-25 MED ORDER — NORETHINDRONE 0.35 MG PO TABS: 1.0000 | ORAL_TABLET | Freq: Every day | ORAL | 11 refills | Status: DC

## 2017-10-25 NOTE — Discharge Summary (Signed)
OB Discharge Summary     Patient Name: Emily Chan DOB: 1980-08-24 MRN: [id number]  Date of admission: 10/22/2017 Delivering MD: Julianne Handler   Date of discharge: 10/25/2017  Admitting diagnosis: 40.3 WEEKS HEAVY CRAMPING Intrauterine pregnancy: [redacted]w[redacted]d     Secondary diagnosis:  Active Problems:   Normal labor   SVD (spontaneous vaginal delivery)  Additional problems:      Discharge diagnosis: Term Pregnancy Delivered                                                                                                Post partum procedures:  Augmentation: AROM  Complications: None  Hospital course:  Onset of Labor With Vaginal Delivery     37 y.o. yo [id number] at [redacted]w[redacted]d was admitted in Active Labor on 10/22/2017. Patient had an uncomplicated labor course as follows:  Membrane Rupture Time/Date: 5:07 AM ,10/23/2017   Intrapartum Procedures: Episiotomy: None [1]                                         Lacerations:  2nd degree [3];Perineal [11]  Patient had a delivery of a Viable infant. 10/23/2017  Information for the patient's newborn:  Adilee, Lemme [[id number]]       Pateint had an uncomplicated postpartum course.  She is ambulating, tolerating a regular diet, passing flatus, and urinating well. Patient is discharged home in stable condition on 10/25/17.   Physical exam  Vitals:   10/24/17 0526 10/24/17 1333 10/24/17 2129 10/25/17 0615  BP: 108/62 118/80 132/88 113/66  Pulse: 72 79 78 72  Resp:  16 16 20   Temp: 98.1 F (36.7 C) 97.8 F (36.6 C) 98.3 F (36.8 C) 98 F (36.7 C)  TempSrc: Oral Oral Oral Oral  SpO2:      Weight:      Height:       General: alert, cooperative and no distress Lochia: appropriate Uterine Fundus: firm Incision: N/A DVT Evaluation: No evidence of DVT seen on physical exam. Negative Homan's sign. No cords or calf tenderness. Labs: Lab Results  Component Value Date   WBC 13.2 (H) 10/23/2017   HGB 13.8 10/23/2017   HCT 38.5  10/23/2017   MCV 89.3 10/23/2017   PLT 134 (L) 10/23/2017   CMP Latest Ref Rng & Units 08/29/2008  Glucose 70 - 99 mg/dL 105(H)  BUN 6 - 23 mg/dL 13  Creatinine 0.4 - 1.2 mg/dL 0.73  Sodium 135 - 145 mEq/L 135  Potassium 3.5 - 5.1 mEq/L 3.4(L)  Chloride 96 - 112 mEq/L 104  CO2 19 - 32 mEq/L 23  Calcium 8.4 - 10.5 mg/dL 9.3    Discharge instruction: per After Visit Summary and "Baby and Me Booklet".  After visit meds:  Allergies as of 10/25/2017      Reactions   Sulfa Antibiotics Rash      Medication List    STOP taking these medications   aspirin EC 81 MG tablet   hydrocortisone 2.5 % rectal cream Commonly  known as:  ANUSOL-HC   multivitamin-prenatal 27-0.8 MG Tabs tablet   TUMS PO     TAKE these medications   ibuprofen 600 MG tablet Commonly known as:  ADVIL,MOTRIN Take 1 tablet (600 mg total) by mouth every 6 (six) hours.   norethindrone 0.35 MG tablet Commonly known as:  ORTHO MICRONOR Take 1 tablet (0.35 mg total) by mouth daily.       Diet: routine diet  Activity: Advance as tolerated. Pelvic rest for 6 weeks.   Outpatient follow up: Follow up Appt: Future Appointments  Date Time Provider [place]  12/02/2017 11:15 AM [name], CNM [facility]   Follow up Visit:No follow-ups on file.  Postpartum contraception: Progesterone only pills  Newborn Data: Live born female  Birth Weight: 8 lb 1.1 oz (3660 g) APGAR: 3, 5  Newborn Delivery   Birth date/time:  10/23/2017 10:39:00 Delivery type:  Vaginal, Spontaneous     Baby Feeding: Breast Disposition:home with mother   10/25/2017 Christin Fudge, CNM

## 2017-10-25 NOTE — Discharge Instructions (Signed)
Postpartum Care After Vaginal Delivery °The period of time right after you deliver your newborn is called the postpartum period. °What kind of medical care will I receive? °· You may continue to receive fluids and medicines through an IV tube inserted into one of your veins. °· If an incision was made near your vagina (episiotomy) or if you had some vaginal tearing during delivery, cold compresses may be placed on your episiotomy or your tear. This helps to reduce pain and swelling. °· You may be given a squirt bottle to use when you go to the bathroom. You may use this until you are comfortable wiping as usual. To use the squirt bottle, follow these steps: °? Before you urinate, fill the squirt bottle with warm water. Do not use hot water. °? After you urinate, while you are sitting on the toilet, use the squirt bottle to rinse the area around your urethra and vaginal opening. This rinses away any urine and blood. °? You may do this instead of wiping. As you start healing, you may use the squirt bottle before wiping yourself. Make sure to wipe gently. °? Fill the squirt bottle with clean water every time you use the bathroom. °· You will be given sanitary pads to wear. °How can I expect to feel? °· You may not feel the need to urinate for several hours after delivery. °· You will have some soreness and pain in your abdomen and vagina. °· If you are breastfeeding, you may have uterine contractions every time you breastfeed for up to several weeks postpartum. Uterine contractions help your uterus return to its normal size. °· It is normal to have vaginal bleeding (lochia) after delivery. The amount and appearance of lochia is often similar to a menstrual period in the first week after delivery. It will gradually decrease over the next few weeks to a dry, yellow-brown discharge. For most women, lochia stops completely by 6-8 weeks after delivery. Vaginal bleeding can vary from woman to woman. °· Within the first few  days after delivery, you may have breast engorgement. This is when your breasts feel heavy, full, and uncomfortable. Your breasts may also throb and feel hard, tightly stretched, warm, and tender. After this occurs, you may have milk leaking from your breasts. Your health care provider can help you relieve discomfort due to breast engorgement. Breast engorgement should go away within a few days. °· You may feel more sad or worried than normal due to hormonal changes after delivery. These feelings should not last more than a few days. If these feelings do not go away after several days, speak with your health care provider. °How should I care for myself? °· Tell your health care provider if you have pain or discomfort. °· Drink enough water to keep your urine clear or pale yellow. °· Wash your hands thoroughly with soap and water for at least 20 seconds after changing your sanitary pads, after using the toilet, and before holding or feeding your baby. °· If you are not breastfeeding, avoid touching your breasts a lot. Doing this can make your breasts produce more milk. °· If you become weak or lightheaded, or you feel like you might faint, ask for help before: °? Getting out of bed. °? Showering. °· Change your sanitary pads frequently. Watch for any changes in your flow, such as a sudden increase in volume, a change in color, the passing of large blood clots. If you pass a blood clot from your vagina, save it   to show to your health care provider. Do not flush blood clots down the toilet without having your health care provider look at them. °· Make sure that all your vaccinations are up to date. This can help protect you and your baby from getting certain diseases. You may need to have immunizations done before you leave the hospital. °· If desired, talk with your health care provider about methods of family planning or birth control (contraception). °How can I start bonding with my baby? °Spending as much time as  possible with your baby is very important. During this time, you and your baby can get to know each other and develop a bond. Having your baby stay with you in your room (rooming in) can give you time to get to know your baby. Rooming in can also help you become comfortable caring for your baby. Breastfeeding can also help you bond with your baby. °How can I plan for returning home with my baby? °· Make sure that you have a car seat installed in your vehicle. °? Your car seat should be checked by a certified car seat installer to make sure that it is installed safely. °? Make sure that your baby fits into the car seat safely. °· Ask your health care provider any questions you have about caring for yourself or your baby. Make sure that you are able to contact your health care provider with any questions after leaving the hospital. °This information is not intended to replace advice given to you by your health care provider. Make sure you discuss any questions you have with your health care provider. °Document Released: 01/07/2007 Document Revised: 08/15/2015 Document Reviewed: 02/14/2015 °Elsevier Interactive Patient Education © 2018 [employer] ° °

## 2017-10-25 NOTE — Lactation Note (Signed)
This note was copied from a baby's chart. Lactation Consultation Note  Patient Name: Emily Chan [id number] Date: 10/25/2017 Reason for consult: Follow-up assessment;Nipple pain/trauma;Term   Follow up with mom of 6 hour old infant. Parents were packing up and ready to walk out the door when [facility] arrived. Infant in deep sleep in mom's arms.   Infant with 8 BF for 10-35 minutes, formula x 2 via bottle of 7-19 ml, EBM x 1 of 2 cc, 4 voids, 3 stools, 1 emesis in the last 24 hours. Infant weight 7 pounds 7.6 ounces with weight loss of 7% since birth. LATCH scores 9-10.   Mom reports she gave formula last night since he was cluster feeding and her nipples are very sore. Mom reports her nipple is longer post feeding, she denies compression or asymmetrical nipple. Reviewed deepening latch by positioning, pillow and head support and flanging of infant lips after latch. Reviewed normalcy of cluster feeding and that infant may do again tonight. Mom denies breasts feeling fuller. Mom reports she does not like the comfort gels and is using Coconut oil to her nipples. Enc EBM prior to coconut oil. Mom showed me her right nipple and it was noted to be pink and intact.   Reviewed I/O, Signs of dehydration in the infant, signs your infant is getting enough, Pacifier usage, engorgement prevention/treatment, and breast milk expression and storage. Mom reports she has a Medela PIS at home for use.   Mom reports she has decreased milk supply with older child. Reviewed supply and demand and importance of offering the breast first with each feeding prior to offering formula. Enc mom to pump and offer infant EBM instead of formula to help keep her supply up, mom voiced understanding. Reviewed milk coming to volume.   Offered to assist mom with feeding prior to d/c, they did not wish to have help at this time. Reviewed [place], mom aware of OP services, BF Support Groups and [place] phone #. Enc mom to call back in the  next few days if her nipples are not feeling better.    Maternal Data Has patient been taught Hand Expression?: Yes Does the patient have breastfeeding experience prior to this delivery?: Yes  Feeding Feeding Type: Breast Fed Length of feed: 20 min  LATCH Score Latch: Grasps breast easily, tongue down, lips flanged, rhythmical sucking.  Audible Swallowing: Spontaneous and intermittent  Type of Nipple: Everted at rest and after stimulation  Comfort (Breast/Nipple): Soft / non-tender  Hold (Positioning): No assistance needed to correctly position infant at breast.  LATCH Score: 10  Interventions Interventions: Breast feeding basics reviewed;Support pillows;Position options;Skin to skin;Breast compression;Hand express;Adjust position;Breast massage;Expressed milk;Coconut oil  Lactation Tools Discussed/Used WIC Program: No   Consult Status Consult Status: Complete Follow-up type: Call as needed    Donn Pierini 10/25/2017, 11:00 AM

## 2017-10-27 ENCOUNTER — Inpatient Hospital Stay (HOSPITAL_COMMUNITY): Admission: RE | Admit: 2017-10-27 | Payer: PRIVATE HEALTH INSURANCE | Source: Ambulatory Visit

## 2017-12-02 ENCOUNTER — Encounter: Payer: Self-pay | Admitting: Women's Health

## 2017-12-02 ENCOUNTER — Ambulatory Visit (INDEPENDENT_AMBULATORY_CARE_PROVIDER_SITE_OTHER): Payer: PRIVATE HEALTH INSURANCE | Admitting: Women's Health

## 2017-12-02 ENCOUNTER — Other Ambulatory Visit: Payer: Self-pay

## 2017-12-02 DIAGNOSIS — K429 Umbilical hernia without obstruction or gangrene: Secondary | ICD-10-CM

## 2017-12-02 NOTE — Progress Notes (Signed)
   POSTPARTUM VISIT Patient name: Emily Chan MRN [id number]  Date of birth: [birth year] Chief Complaint:   Postpartum Care  History of Present Illness:   Emily Chan is a 37 y.o. [phone] Caucasian female being seen today for a postpartum visit. She is 5 weeks postpartum following a spontaneous vaginal delivery at 40.4 gestational weeks. Anesthesia: epidural. I have fully reviewed the prenatal and intrapartum course. Pregnancy uncomplicated. Postpartum course has been uncomplicated. Does report pain in area of tailbone when sitting down on soft surfaces only. Also occasional, not daily, pain around umbilicus. Bleeding no bleeding. Bowel function is normal. Bladder function is normal.  Patient is not sexually active. Last sexual activity: prior to birth of baby.  Contraception method is oral progesterone-only contraceptive.  [place] Postpartum Depression Screening: negative. Score 3.   Last pap 09/06/16.  Results were normal- all others have been abnormal No LMP recorded.  Baby's course has been complicated by hydronephrosis, goes back to urologist in Oct. Baby is feeding by breast & bottle.  Review of Systems:   Pertinent items are noted in HPI Denies Abnormal vaginal discharge w/ itching/odor/irritation, headaches, visual changes, shortness of breath, chest pain, abdominal pain, severe nausea/vomiting, or problems with urination or bowel movements. Pertinent History Reviewed:  Reviewed past medical,surgical, obstetrical and family history.  Reviewed problem list, medications and allergies. OB History  Gravida Para Term Preterm AB Living  3 2 1 1 1 2   SAB TAB Ectopic Multiple Live Births  1     0 2    # Outcome Date GA Lbr Len/2nd Weight Sex Delivery Anes PTL Lv  3 Term 10/23/17 [redacted]w[redacted]d 11:45 / 01:24 8 lb 1.1 oz (3.66 kg) M Vag-Spont EPI  LIV  2 Preterm 03/24/15 [redacted]w[redacted]d 10:50 / 01:03 5 lb 9.1 oz (2.525 kg) M Vag-Spont EPI N LIV  1 SAB 03/29/14 [redacted]w[redacted]d           Obstetric Comments    14.3wks missed ab, CRL measures 8wks   Physical Assessment:   Vitals:   12/02/17 1132  BP: 117/76  Pulse: 73  Weight: 177 lb (80.3 kg)  Height: 5\' 3"  (1.6 m)  Body mass index is 31.35 kg/m.       Physical Examination:   General appearance: alert, well appearing, and in no distress  Mental status: alert, oriented to person, place, and time  Skin: warm & dry   Cardiovascular: normal heart rate noted   Respiratory: normal respiratory effort, no distress   Breasts: deferred, no complaints   Abdomen: soft, non-tender, small reducible umbilical hernia  Pelvic: VULVA: normal appearing vulva with no masses, tenderness or lesions, UTERUS: uterus is normal size, shape, consistency and nontender  Rectal: small external non-thrombosed hemorrhoids  Extremities: no edema       No results found for this or any previous visit (from the past 24 hour(s)).  Assessment & Plan:  1) Postpartum exam 2) 5 wks s/p SVB 3) Breast & bottlefeeding 4) Depression screening 5) Contraception surveillance> on POPs, continue as directed 6) Small reducible umbilical hernia 7) Pain in tailbone when sitting on soft surfaces> donut pillow, time- if not improving let us know  Meds: No orders of the defined types were placed in this encounter.   Follow-up: Return for within next month for , Pap & physical.   No orders of the defined types were placed in this encounter.   [place] 12/02/2017 12:21 PM

## 2017-12-02 NOTE — Patient Instructions (Signed)
Tips To Increase Milk Supply  Lots of water! Enough so that your urine is clear  Plenty of calories, if you're not getting enough calories, your milk supply can decrease  Breastfeed/pump often, every 2-3 hours x 20-64mins  Fenugreek 3 pills 3 times a day, this may make your urine smell like maple syrup  Mother's Milk Tea  Lactation cookies, google for the recipe  Real oatmeal  Body Armour drink

## 2017-12-30 ENCOUNTER — Other Ambulatory Visit (HOSPITAL_COMMUNITY)
Admission: RE | Admit: 2017-12-30 | Discharge: 2017-12-30 | Disposition: A | Discharge status: Home / Self Care | Payer: PRIVATE HEALTH INSURANCE | Source: Ambulatory Visit | Attending: Obstetrics & Gynecology | Admitting: Obstetrics & Gynecology

## 2017-12-30 ENCOUNTER — Other Ambulatory Visit: Payer: Self-pay

## 2017-12-30 ENCOUNTER — Ambulatory Visit (INDEPENDENT_AMBULATORY_CARE_PROVIDER_SITE_OTHER): Payer: PRIVATE HEALTH INSURANCE | Admitting: Women's Health

## 2017-12-30 ENCOUNTER — Encounter: Payer: Self-pay | Admitting: Women's Health

## 2017-12-30 VITALS — BP 123/76 | HR 76 | Ht 63.0 in | Wt 175.0 lb

## 2017-12-30 DIAGNOSIS — Z8742 Personal history of other diseases of the female genital tract: Secondary | ICD-10-CM

## 2017-12-30 DIAGNOSIS — Z01419 Encounter for gynecological examination (general) (routine) without abnormal findings: Secondary | ICD-10-CM

## 2017-12-30 NOTE — Progress Notes (Signed)
   WELL-WOMAN EXAMINATION Patient name: Emily Chan MRN [id number]  Date of birth: 09/03/1980 Chief Complaint:   Gynecologic Exam  History of Present Illness:   Emily Chan is a 37 y.o. [phone] Caucasian female being seen today for a routine well-woman exam.  Current complaints: none  PCP: Luan Pulling      does not desire labs Patient's last menstrual period was 12/22/2017. The current method of family planning is oral progesterone-only contraceptive Last pap 08/27/16. Results were: normal, however all previous were abnormal Last mammogram: never. Results were: n/a Last colonoscopy: never. Results were: n/a  Review of Systems:   Pertinent items are noted in HPI Denies any headaches, blurred vision, fatigue, shortness of breath, chest pain, abdominal pain, abnormal vaginal discharge/itching/odor/irritation, problems with periods, bowel movements, urination, or intercourse unless otherwise stated above. Pertinent History Reviewed:  Reviewed past medical,surgical, social and family history.  Reviewed problem list, medications and allergies. Physical Assessment:   Vitals:   12/30/17 1104  BP: 123/76  Pulse: 76  Weight: 175 lb (79.4 kg)  Height: 5\' 3"  (1.6 m)  Body mass index is 31 kg/m.        Physical Examination:   General appearance - well appearing, and in no distress  Mental status - alert, oriented to person, place, and time  Psych:  She has a normal mood and affect  Skin - warm and dry, normal color, no suspicious lesions noted  Chest - effort normal, all lung fields clear to auscultation bilaterally  Heart - normal rate and regular rhythm  Neck:  midline trachea, no thyromegaly or nodules  Breasts - breasts appear normal, no suspicious masses, no skin or nipple changes or  axillary nodes  Abdomen - soft, nontender, nondistended, no masses or organomegaly  Pelvic - VULVA: normal appearing vulva with no masses, tenderness or lesions  VAGINA: normal appearing vagina with  normal color and discharge, no lesions  CERVIX: normal appearing cervix without discharge or lesions, no CMT  Thin prep pap is done w/ HR HPV cotesting  UTERUS: uterus is felt to be normal size, shape, consistency and nontender   ADNEXA: No adnexal masses or tenderness noted.  Extremities:  No swelling or varicosities noted  No results found for this or any previous visit (from the past 24 hour(s)).  Assessment & Plan:  1) Well-Woman Exam  2) H/O abnormal paps  Labs/procedures today: pap  Mammogram @36yo  or sooner if problems Colonoscopy @36yo  or sooner if problems  No orders of the defined types were placed in this encounter.   Follow-up: Return in about 1 year (around 12/31/2018) for Physical.  [place], WHNP-BC 12/30/2017 11:27 AM

## 2017-12-30 NOTE — Addendum Note (Signed)
Addended by: Gaylyn Rong A on: 12/30/2017 11:49 AM   Modules accepted: Orders

## 2017-12-31 LAB — CYTOLOGY - PAP
DIAGNOSIS: NEGATIVE
HPV: NOT DETECTED

## 2018-01-06 ENCOUNTER — Other Ambulatory Visit: Payer: Self-pay

## 2018-01-06 ENCOUNTER — Ambulatory Visit (INDEPENDENT_AMBULATORY_CARE_PROVIDER_SITE_OTHER): Payer: PRIVATE HEALTH INSURANCE | Admitting: Emergency Medicine

## 2018-01-06 ENCOUNTER — Encounter: Payer: Self-pay | Admitting: Emergency Medicine

## 2018-01-06 VITALS — BP 97/67 | HR 89 | Temp 98.5°F | Resp 16 | Ht 63.25 in | Wt 173.8 lb

## 2018-01-06 DIAGNOSIS — J069 Acute upper respiratory infection, unspecified: Secondary | ICD-10-CM

## 2018-01-06 DIAGNOSIS — R05 Cough: Secondary | ICD-10-CM | POA: Diagnosis not present

## 2018-01-06 DIAGNOSIS — R059 Cough, unspecified: Secondary | ICD-10-CM | POA: Insufficient documentation

## 2018-01-06 NOTE — Progress Notes (Signed)
Emily Chan 37 y.o.   Chief Complaint  Patient presents with  . Cough    X 2 days  productive yellow mucus  . Sore Throat    HISTORY OF PRESENT ILLNESS: This is a 37 y.o. female complaining of 2-day history of productive cough and sore throat.  2 kids at home also sick, 33-month-old and 74-year-old.  Breast-feeding.  No other significant symptoms.  Denies nausea or vomiting.  Denies diarrhea.  Denies fever or chills.  HPI   Prior to Admission medications   Medication Sig Start Date End Date Taking? Authorizing Provider  norethindrone (ORTHO MICRONOR) 0.35 MG tablet Take 1 tablet (0.35 mg total) by mouth daily. 10/25/17  Yes Cresenzo-Dishmon, Joaquim Lai, CNM    Allergies  Allergen Reactions  . Sulfa Antibiotics Rash    Patient Active Problem List   Diagnosis Date Noted  . 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 Abdominal cramping affecting pregnancy 08/24/2014  . Abnormal Pap smear   . Bleeding in early pregnancy 09/02/2014   Pelvic rest  . Contraception management 06/16/2012  . Contraceptive management 05/03/2015  . Hemorrhoids 05/03/2015  . History of abnormal cervical Pap smear 08/18/2015  . Hx: UTI (urinary tract infection)   . Miscarriage   . Numbness in both legs 08/18/2015   Has numbness and tingling in both legs since epidural, on and off esp at night  . Pregnant 01/28/2014  . Vaginal irritation 12/25/2012  . Vaginal Pap smear, abnormal   . Yeast infection 12/25/2012    Past Surgical History:  Procedure Laterality Date  . APPENDECTOMY    . WISDOM TOOTH EXTRACTION      Social History   Socioeconomic History  . Marital status: Married    Spouse name: Not on file  . Number of children: Not on file  . Years of education: Not on file  . Highest education level: Not on file  Occupational History  . Not on file  Social Needs  . Financial resource strain: Not on file  . Food insecurity:      Worry: Not on file    Inability: Not on file  . Transportation needs:    Medical: Not on file    Non-medical: Not on file  Tobacco Use  . Smoking status: Never Smoker  . Smokeless tobacco: Never Used  Substance and Sexual Activity  . Alcohol use: No    Frequency: Never    Comment: occ.  . Drug use: No  . Sexual activity: Yes    Birth control/protection: None  Lifestyle  . Physical activity:    Days per week: Not on file    Minutes per session: Not on file  . Stress: Not on file  Relationships  . Social connections:    Talks on phone: Not on file    Gets together: Not on file    Attends religious service: Not on file    Active member of club or organization: Not on file    Attends meetings of clubs or organizations: Not on file    Relationship status: Not on file  . Intimate partner violence:    Fear of current or ex partner: Not on file    Emotionally abused: Not on file    Physically abused: Not on file    Forced sexual activity: Not on file  Other Topics Concern  . Not on file  Social History Narrative  .  Not on file    Family History  Problem Relation Age of Onset  . Cancer Paternal Aunt        melenoma  . Diabetes Maternal Grandmother   . Hypertension Maternal Grandmother   . Cancer Maternal Grandfather        Brain and Lung  . Diabetes Maternal Grandfather   . Cancer Paternal Grandmother        breast  . Cancer Maternal Uncle      Review of Systems  Constitutional: Negative.  Negative for chills and fever.  HENT: Positive for congestion and sore throat.   Eyes: Negative.   Respiratory: Positive for cough.   Cardiovascular: Negative.  Negative for chest pain and palpitations.  Gastrointestinal: Negative.  Negative for abdominal pain, diarrhea, nausea and vomiting.  Genitourinary: Negative.  Negative for dysuria.  Musculoskeletal: Negative.  Negative for back pain, myalgias and neck pain.  Skin: Negative.  Negative for rash.  Neurological:  Negative.  Negative for dizziness and headaches.  Endo/Heme/Allergies: Negative.   All other systems reviewed and are negative.   Vitals:   01/06/18 1501  BP: 97/67  Pulse: 89  Resp: 16  Temp: 98.5 F (36.9 C)  SpO2: 96%    Physical Exam  Constitutional: She is oriented to person, place, and time. She appears well-developed and well-nourished.  HENT:  Head: Normocephalic and atraumatic.  Right Ear: External ear normal.  Left Ear: External ear normal.  Nose: Nose normal.  Mouth/Throat: Oropharynx is clear and moist. No oropharyngeal exudate.  Eyes: Pupils are equal, round, and reactive to light. Conjunctivae and EOM are normal.  Neck: Normal range of motion. Neck supple. No thyromegaly present.  Cardiovascular: Normal rate, regular rhythm and normal heart sounds.  Pulmonary/Chest: Effort normal and breath sounds normal.  Abdominal: Soft. Bowel sounds are normal. She exhibits no distension. There is no tenderness.  Musculoskeletal: Normal range of motion.  Lymphadenopathy:    She has no cervical adenopathy.  Neurological: She is alert and oriented to person, place, and time. No sensory deficit. She exhibits normal muscle tone.  Skin: Skin is warm and dry. Capillary refill takes less than 2 seconds.  Psychiatric: She has a normal mood and affect. Her behavior is normal.  Vitals reviewed.  A total of 25 minutes was spent in the room with the patient, greater than 50% of which was in counseling/coordination of care regarding differential diagnosis, medications, prognosis, and need for follow-up if no better or worse.    ASSESSMENT & PLAN: Emily Chan was seen today for cough and sore throat.  Diagnoses and all orders for this visit:  Acute upper respiratory infection  Cough    Patient Instructions       If you have lab work done today you will be contacted with your lab results within the next 2 weeks.  If you have not heard from [place] then please contact us. The fastest  way to get your results is to register for My Chart.   IF you received an x-ray today, you will receive an invoice from [facility]. Please contact [facility] at [phone] with questions or concerns regarding your invoice.   IF you received labwork today, you will receive an invoice from [facility]. Please contact [facility] at [phone] with questions or concerns regarding your invoice.   Our billing staff will not be able to assist you with questions regarding bills from these companies.  You will be contacted with the lab results as soon as they are  available. The fastest way to get your results is to activate your My Chart account. Instructions are located on the last page of this paperwork. If you have not heard from [place] regarding the results in 2 weeks, please contact this office.     Upper Respiratory Infection, Adult Most upper respiratory infections (URIs) are caused by a virus. A URI affects the nose, throat, and upper air passages. The most common type of URI is often called "the common cold." Follow these instructions at home:  Take medicines only as told by your doctor.  Gargle warm saltwater or take cough drops to comfort your throat as told by your doctor.  Use a warm mist humidifier or inhale steam from a shower to increase air moisture. This may make it easier to breathe.  Drink enough fluid to keep your pee (urine) clear or pale yellow.  Eat soups and other clear broths.  Have a healthy diet.  Rest as needed.  Go back to work when your fever is gone or your doctor says it is okay. ? You may need to stay home longer to avoid giving your URI to others. ? You can also wear a face mask and wash your hands often to prevent spread of the virus.  Use your inhaler more if you have asthma.  Do not use any tobacco products, including cigarettes, chewing tobacco, or electronic cigarettes. If you need help quitting, ask your doctor. Contact a doctor  if:  You are getting worse, not better.  Your symptoms are not helped by medicine.  You have chills.  You are getting more short of breath.  You have brown or red mucus.  You have yellow or brown discharge from your nose.  You have pain in your face, especially when you bend forward.  You have a fever.  You have puffy (swollen) neck glands.  You have pain while swallowing.  You have white areas in the back of your throat. Get help right away if:  You have very bad or constant: ? Headache. ? Ear pain. ? Pain in your forehead, behind your eyes, and over your cheekbones (sinus pain). ? Chest pain.  You have long-lasting (chronic) lung disease and any of the following: ? Wheezing. ? Long-lasting cough. ? Coughing up blood. ? A change in your usual mucus.  You have a stiff neck.  You have changes in your: ? Vision. ? Hearing. ? Thinking. ? Mood. This information is not intended to replace advice given to you by your health care provider. Make sure you discuss any questions you have with your health care provider. Document Released: 08/29/2007 Document Revised: 11/13/2015 Document Reviewed: 06/17/2013 Elsevier Interactive Patient Education  2018 Elsevier Inc.      Agustina Caroli, MD [facility]

## 2018-01-06 NOTE — Patient Instructions (Addendum)
   If you have lab work done today you will be contacted with your lab results within the next 2 weeks.  If you have not heard from us then please contact us. The fastest way to get your results is to register for My Chart.   IF you received an x-ray today, you will receive an invoice from [facility]. Please contact [facility] at [phone] with questions or concerns regarding your invoice.   IF you received labwork today, you will receive an invoice from [facility]. Please contact [facility] at [phone] with questions or concerns regarding your invoice.   Our billing staff will not be able to assist you with questions regarding bills from these companies.  You will be contacted with the lab results as soon as they are available. The fastest way to get your results is to activate your My Chart account. Instructions are located on the last page of this paperwork. If you have not heard from us regarding the results in 2 weeks, please contact this office.     Upper Respiratory Infection, Adult Most upper respiratory infections (URIs) are caused by a virus. A URI affects the nose, throat, and upper air passages. The most common type of URI is often called "the common cold." Follow these instructions at home:  Take medicines only as told by your doctor.  Gargle warm saltwater or take cough drops to comfort your throat as told by your doctor.  Use a warm mist humidifier or inhale steam from a shower to increase air moisture. This may make it easier to breathe.  Drink enough fluid to keep your pee (urine) clear or pale yellow.  Eat soups and other clear broths.  Have a healthy diet.  Rest as needed.  Go back to work when your fever is gone or your doctor says it is okay. ? You may need to stay home longer to avoid giving your URI to others. ? You can also wear a face mask and wash your hands often to prevent spread of the virus.  Use your inhaler more if you  have asthma.  Do not use any tobacco products, including cigarettes, chewing tobacco, or electronic cigarettes. If you need help quitting, ask your doctor. Contact a doctor if:  You are getting worse, not better.  Your symptoms are not helped by medicine.  You have chills.  You are getting more short of breath.  You have brown or red mucus.  You have yellow or brown discharge from your nose.  You have pain in your face, especially when you bend forward.  You have a fever.  You have puffy (swollen) neck glands.  You have pain while swallowing.  You have white areas in the back of your throat. Get help right away if:  You have very bad or constant: ? Headache. ? Ear pain. ? Pain in your forehead, behind your eyes, and over your cheekbones (sinus pain). ? Chest pain.  You have long-lasting (chronic) lung disease and any of the following: ? Wheezing. ? Long-lasting cough. ? Coughing up blood. ? A change in your usual mucus.  You have a stiff neck.  You have changes in your: ? Vision. ? Hearing. ? Thinking. ? Mood. This information is not intended to replace advice given to you by your health care provider. Make sure you discuss any questions you have with your health care provider. Document Released: 08/29/2007 Document Revised: 11/13/2015 Document Reviewed: 06/17/2013 Elsevier Interactive Patient Education  2018 Elsevier   Inc.  

## 2018-10-02 ENCOUNTER — Other Ambulatory Visit: Payer: PRIVATE HEALTH INSURANCE

## 2018-10-02 ENCOUNTER — Other Ambulatory Visit: Payer: Self-pay

## 2018-10-02 DIAGNOSIS — Z20822 Contact with and (suspected) exposure to covid-19: Secondary | ICD-10-CM

## 2018-10-07 LAB — NOVEL CORONAVIRUS, NAA: SARS-CoV-2, NAA: NOT DETECTED

## 2018-10-20 ENCOUNTER — Other Ambulatory Visit: Payer: Self-pay | Admitting: Advanced Practice Midwife

## 2018-12-24 ENCOUNTER — Other Ambulatory Visit: Payer: Self-pay | Admitting: *Deleted

## 2018-12-24 DIAGNOSIS — Z20822 Contact with and (suspected) exposure to covid-19: Secondary | ICD-10-CM

## 2018-12-25 LAB — NOVEL CORONAVIRUS, NAA: SARS-CoV-2, NAA: NOT DETECTED

## 2019-01-05 ENCOUNTER — Other Ambulatory Visit: Payer: PRIVATE HEALTH INSURANCE | Admitting: Women's Health

## 2019-01-12 ENCOUNTER — Other Ambulatory Visit: Payer: Self-pay

## 2019-01-12 ENCOUNTER — Other Ambulatory Visit (HOSPITAL_COMMUNITY)
Admission: RE | Admit: 2019-01-12 | Discharge: 2019-01-12 | Disposition: A | Discharge status: Home / Self Care | Payer: PRIVATE HEALTH INSURANCE | Source: Ambulatory Visit | Attending: Obstetrics and Gynecology | Admitting: Obstetrics and Gynecology

## 2019-01-12 ENCOUNTER — Ambulatory Visit: Payer: PRIVATE HEALTH INSURANCE | Admitting: Women's Health

## 2019-01-12 ENCOUNTER — Encounter: Payer: Self-pay | Admitting: Women's Health

## 2019-01-12 VITALS — BP 112/68 | HR 63 | Ht 63.25 in | Wt 127.0 lb

## 2019-01-12 DIAGNOSIS — Z01419 Encounter for gynecological examination (general) (routine) without abnormal findings: Secondary | ICD-10-CM | POA: Insufficient documentation

## 2019-01-12 DIAGNOSIS — Z30011 Encounter for initial prescription of contraceptive pills: Secondary | ICD-10-CM

## 2019-01-12 MED ORDER — LO LOESTRIN FE 1 MG-10 MCG / 10 MCG PO TABS: 1.0000 | ORAL_TABLET | Freq: Every day | ORAL | 3 refills | Status: DC

## 2019-01-12 NOTE — Addendum Note (Signed)
Addended by: Linton Rump on: 01/12/2019 12:23 PM   Modules accepted: Orders

## 2019-01-12 NOTE — Progress Notes (Signed)
WELL-WOMAN EXAMINATION Patient name: Emily Chan MRN [id number]  Date of birth: [birth year] Chief Complaint:   Gynecologic Exam  History of Present Illness:   Emily Chan is a 38 y.o. [phone] Caucasian female being seen today for a routine well-woman exam.  Current complaints: occ pain at umbilicus, will last a few hours then go away, has been happening since January. No other sx.   PCP: Dr. Luan Pulling      does not desire labs Patient's last menstrual period was 01/03/2019. The current method of family planning is oral progesterone-only contraceptive- was rx'd when breastfeeding, never changed, wants to switch to COCs. Does not smoke, no h/o HTN, DVT/PE, CVA, MI, or migraines w/ aura.  Last pap 12/30/17. Results were: neg w/ -HRHPV, wants pap, had abnormal in 2017 Last mammogram: never. Results were: normal. Family h/o breast cancer: Yes, PGM in 41s Last colonoscopy: never. Results were: n/a. Family h/o colorectal cancer: No Review of Systems:   Pertinent items are noted in HPI Denies any headaches, blurred vision, fatigue, shortness of breath, chest pain, abdominal pain, abnormal vaginal discharge/itching/odor/irritation, problems with periods, bowel movements, urination, or intercourse unless otherwise stated above. Pertinent History Reviewed:  Reviewed past medical,surgical, social and family history.  Reviewed problem list, medications and allergies. Physical Assessment:   Vitals:   01/12/19 1143  BP: 112/68  Pulse: 63  Weight: 127 lb (57.6 kg)  Height: 5' 3.25" (1.607 m)  Body mass index is 22.32 kg/m.        Physical Examination:   General appearance - well appearing, and in no distress  Mental status - alert, oriented to person, place, and time  Psych:  She has a normal mood and affect  Skin - warm and dry, normal color, no suspicious lesions noted  Chest - effort normal, all lung fields clear to auscultation bilaterally  Heart - normal rate and regular rhythm  Neck:  midline trachea, no thyromegaly or nodules  Breasts - breasts appear normal, no suspicious masses, no skin or nipple changes or  axillary nodes  Abdomen - soft, nontender, nondistended, no masses or organomegaly. No umbilical hernia noted, possibly some diastasis recti  Pelvic - VULVA: normal appearing vulva with no masses, tenderness or lesions  VAGINA: normal appearing vagina with normal color and discharge, no lesions  CERVIX: normal appearing cervix without discharge or lesions, no CMT  Thin prep pap is done w/ HR HPV cotesting  UTERUS: uterus is felt to be normal size, shape, consistency and nontender   ADNEXA: No adnexal masses or tenderness noted.  Extremities:  No swelling or varicosities noted  No results found for this or any previous visit (from the past 24 hour(s)).  Assessment & Plan:  1) Well-Woman Exam  2) Occ umbilical pain> no hernia noted, possible diastasis recti, let us know if worsens  3) Contraception management> rx LoLoestrin  Labs/procedures today: pap  Mammogram @37yo  or sooner if problems Colonoscopy @37yo  or sooner if problems  No orders of the defined types were placed in this encounter.   Meds:  Meds ordered this encounter  Medications  . LO LOESTRIN FE 1 MG-10 MCG / 10 MCG tablet    Sig: Take 1 tablet by mouth daily.    Dispense:  3 Package    Refill:  3    For co-pay card, pt to text "Lo Loestrin Fe " to [phone]              Co-pay card must be run in second  position  "other coverage code 3"  if denied d/t PA, step edit, or insurance denial    Order Specific Question:   Supervising Provider    Answer:   Florian Buff [[id number]]    Follow-up: Return in about 1 year (around 01/12/2020) for Physical.  [place], WHNP-BC 01/12/2019 12:21 PM

## 2019-01-15 LAB — CYTOLOGY - PAP
Chlamydia: NEGATIVE
Comment: NEGATIVE
Comment: NEGATIVE
Comment: NORMAL
Diagnosis: NEGATIVE
High risk HPV: NEGATIVE
Neisseria Gonorrhea: NEGATIVE

## 2019-01-22 ENCOUNTER — Other Ambulatory Visit: Payer: Self-pay | Admitting: Women's Health

## 2019-01-22 MED ORDER — TAYTULLA 1-20 MG-MCG(24) PO CAPS: 1.0000 | ORAL_CAPSULE | Freq: Every day | ORAL | 11 refills | Status: DC

## 2019-01-23 ENCOUNTER — Other Ambulatory Visit: Payer: Self-pay | Admitting: Women's Health

## 2019-01-23 MED ORDER — NORGESTIM-ETH ESTRAD TRIPHASIC 0.18/0.215/0.25 MG-25 MCG PO TABS: 1.0000 | ORAL_TABLET | Freq: Every day | ORAL | 11 refills | Status: DC

## 2019-03-31 ENCOUNTER — Other Ambulatory Visit: Payer: PRIVATE HEALTH INSURANCE

## 2019-04-02 ENCOUNTER — Ambulatory Visit: Payer: PRIVATE HEALTH INSURANCE | Attending: Internal Medicine

## 2019-04-02 ENCOUNTER — Other Ambulatory Visit: Payer: Self-pay

## 2019-04-02 DIAGNOSIS — Z20822 Contact with and (suspected) exposure to covid-19: Secondary | ICD-10-CM

## 2019-04-03 LAB — NOVEL CORONAVIRUS, NAA: SARS-CoV-2, NAA: NOT DETECTED

## 2019-10-07 ENCOUNTER — Encounter (HOSPITAL_COMMUNITY): Payer: Self-pay

## 2019-10-07 ENCOUNTER — Ambulatory Visit (HOSPITAL_COMMUNITY)
Admission: EM | Admit: 2019-10-07 | Discharge: 2019-10-07 | Disposition: A | Discharge status: Home / Self Care | Payer: Commercial Managed Care - PPO

## 2019-10-07 ENCOUNTER — Other Ambulatory Visit: Payer: Self-pay

## 2019-10-07 DIAGNOSIS — B084 Enteroviral vesicular stomatitis with exanthem: Secondary | ICD-10-CM

## 2019-10-07 DIAGNOSIS — R21 Rash and other nonspecific skin eruption: Secondary | ICD-10-CM

## 2019-10-07 MED ORDER — NAPROXEN 500 MG PO TABS: 500.0000 mg | ORAL_TABLET | Freq: Two times a day (BID) | ORAL | 0 refills | Status: DC

## 2019-10-07 MED ORDER — HYDROXYZINE HCL 25 MG PO TABS: 12.5000 mg | ORAL_TABLET | Freq: Four times a day (QID) | ORAL | 0 refills | Status: DC

## 2019-10-07 NOTE — Discharge Instructions (Addendum)
We will manage this as a viral syndrome caused by Hand Foot and Mouth Disease. For sore throat or cough try using a honey-based tea. Use 3 teaspoons of honey with juice squeezed from half lemon. Place shaved pieces of ginger into 1/2-1 cup of water and warm over stove top. Then mix the ingredients and repeat every 4 hours as needed. Please take ibuprofen 400mg -600mg  every 6 hours with food alternating with OR taken together with Tylenol 500mg -650mg  every 6 hours for fevers, aches and pains. Hydrate very well with at least 2 liters of water. Eat light meals such as soups (chicken and noodles, vegetable, chicken and wild rice).  Do not eat foods that you are allergic to.  Start an antihistamine like Zyrtec, Allegra or Claritin for postnasal drainage, sinus congestion.  You can take this together with pseudoephedrine (Sudafed) at a dose of 60 mg 3 times a day oral 120 mg twice daily as needed for the same kind of congestion.  However, limit your use of pseudoephedrine if you have high blood pressure or avoid altogether if you have abnormal heart rhythms, heart condition.

## 2019-10-07 NOTE — ED Provider Notes (Signed)
[place]   MRN: [id number] DOB: [birth year]  Subjective:   Emily Chan is a 39 y.o. female presenting for 2 day hx of throat pain, itching rash on her hands since yesterday. Her kids were diagnosed with HFM disease ~1 week ago. She has concerns about allergic reaction as she was recently clearing brush in her yard.   No current facility-administered medications for this encounter.  Current Outpatient Medications:  .  acetaminophen (TYLENOL) 500 MG tablet, Take 500 mg by mouth every 6 (six) hours as needed., Disp: , Rfl:  .  diphenhydrAMINE (BENADRYL) 50 MG capsule, Take 50 mg by mouth every 6 (six) hours as needed., Disp: , Rfl:  .  Multiple Vitamin (MULTIVITAMIN) tablet, Take 1 tablet by mouth daily., Disp: , Rfl:  .  Norgestimate-Ethinyl Estradiol Triphasic 0.18/0.215/0.25 MG-25 MCG tab, Take 1 tablet by mouth daily., Disp: 1 Package, Rfl: 11   Allergies  Allergen Reactions  . Sulfa Antibiotics Rash    Past Medical History:  Diagnosis Date  . Abdominal cramping affecting pregnancy 08/24/2014  . Abnormal Pap smear   . Bleeding in early pregnancy 09/02/2014   Pelvic rest  . Contraception management 06/16/2012  . Contraceptive management 05/03/2015  . Hemorrhoids 05/03/2015  . History of abnormal cervical Pap smear 08/18/2015  . Hx: UTI (urinary tract infection)   . Miscarriage   . Numbness in both legs 08/18/2015   Has numbness and tingling in both legs since epidural, on and off esp at night  . Pregnant 01/28/2014  . Vaginal irritation 12/25/2012  . Vaginal Pap smear, abnormal   . Yeast infection 12/25/2012     Past Surgical History:  Procedure Laterality Date  . APPENDECTOMY    . WISDOM TOOTH EXTRACTION      Family History  Problem Relation Age of Onset  . Cancer Paternal Aunt        melenoma  . Diabetes Maternal Grandmother   . Hypertension Maternal Grandmother   . Cancer Maternal Grandfather        Brain and Lung  . Diabetes Maternal Grandfather   .  Cancer Paternal Grandmother        breast  . Cancer Maternal Uncle     Social History   Tobacco Use  . Smoking status: Never Smoker  . Smokeless tobacco: Never Used  Vaping Use  . Vaping Use: Never used  Substance Use Topics  . Alcohol use: No    Comment: occ.  . Drug use: No    ROS   Objective:   Vitals: BP 112/77 (BP Location: Right Arm)   Pulse 89   Temp 98.3 F (36.8 C) (Oral)   Resp 16   SpO2 99%   Physical Exam Constitutional:      General: She is not in acute distress.    Appearance: Normal appearance. She is well-developed. She is not ill-appearing, toxic-appearing or diaphoretic.  HENT:     Head: Normocephalic and atraumatic.     Nose: Nose normal.     Mouth/Throat:     Mouth: Mucous membranes are moist.     Pharynx: Oropharynx is clear.  Eyes:     General: No scleral icterus.    Extraocular Movements: Extraocular movements intact.     Pupils: Pupils are equal, round, and reactive to light.  Cardiovascular:     Rate and Rhythm: Normal rate and regular rhythm.     Pulses: Normal pulses.     Heart sounds: Normal heart sounds. No murmur heard.  No friction rub. No gallop.   Pulmonary:     Effort: Pulmonary effort is normal. No respiratory distress.     Breath sounds: Normal breath sounds. No stridor. No wheezing, rhonchi or rales.  Skin:    General: Skin is warm and dry.     Findings: Rash (round macular lesions over hands, feet; no lesions of the mouth) present.  Neurological:     General: No focal deficit present.     Mental Status: She is alert and oriented to person, place, and time.  Psychiatric:        Mood and Affect: Mood normal.        Behavior: Behavior normal.        Thought Content: Thought content normal.     Assessment and Plan :   PDMP not reviewed this encounter.  1. Rash and nonspecific skin eruption   2. Hand, foot and mouth disease     Recommended supportive care for HFM disease. Offered hydroxyzine to address concerns  about poison sumac, contact dermatitis from clearing brush but counseled that this is less likely given her symptoms set and physical exam findings. Counseled patient on potential for adverse effects with medications prescribed/recommended today, ER and return-to-clinic precautions discussed, patient verbalized understanding.    Jaynee Eagles, PA-C 10/07/19 1624

## 2019-10-07 NOTE — ED Triage Notes (Signed)
Pt presents with burning and itching rash in the hands x 1 day; sore throat x 2 days. States can be exposure to sumac or hand and mouth disease as her kids ahd it last week.

## 2019-11-28 ENCOUNTER — Other Ambulatory Visit: Payer: Self-pay | Admitting: Women's Health

## 2020-01-18 ENCOUNTER — Encounter: Payer: Self-pay | Admitting: Women's Health

## 2020-01-18 ENCOUNTER — Ambulatory Visit (INDEPENDENT_AMBULATORY_CARE_PROVIDER_SITE_OTHER): Payer: Commercial Managed Care - PPO | Admitting: Women's Health

## 2020-01-18 VITALS — BP 120/72 | HR 59 | Ht 63.0 in | Wt 136.8 lb

## 2020-01-18 DIAGNOSIS — Z01419 Encounter for gynecological examination (general) (routine) without abnormal findings: Secondary | ICD-10-CM

## 2020-01-18 DIAGNOSIS — Z1329 Encounter for screening for other suspected endocrine disorder: Secondary | ICD-10-CM

## 2020-01-18 DIAGNOSIS — R5383 Other fatigue: Secondary | ICD-10-CM

## 2020-01-18 DIAGNOSIS — Z1321 Encounter for screening for nutritional disorder: Secondary | ICD-10-CM | POA: Diagnosis not present

## 2020-01-18 NOTE — Progress Notes (Signed)
WELL-WOMAN EXAMINATION Patient name: Patina Spanier MRN [id number]  Date of birth: 22-Mar-1981 Chief Complaint:   Gynecologic Exam  History of Present Illness:   Rosebud Koenen is a 39 y.o. [phone] Caucasian female being seen today for a routine well-woman exam.  Current complaints: wants to switch birth control, can't remember to take pills. Not having sex, b/c she is too tired.  Doesn't know if she's overdoing it w/ work or what. Pain LLQ mid cycle and right before period starts, only last a few hours. Denies abnormal discharge, itching/odor/irritation.    Depression screen [facility] 2/9 01/18/2020 01/12/2019 01/06/2018 12/30/2017 03/28/2017  Decreased Interest 0 0 0 0 0  Down, Depressed, Hopeless 0 0 0 0 0  PHQ - 2 Score 0 0 0 0 0  Altered sleeping 1 - - - 1  Tired, decreased energy 0 - - - 2  Change in appetite 0 - - - 0  Feeling bad or failure about yourself  0 - - - 0  Trouble concentrating 0 - - - 0  Moving slowly or fidgety/restless 0 - - - 0  Suicidal thoughts 0 - - - 0  PHQ-9 Score 1 - - - 3  Difficult doing work/chores - - - - Not difficult at all     PCP: none      does desire labs to evaluate fatigue Patient's last menstrual period was 12/28/2019 (exact date). The current method of family planning is OCP (estrogen/progesterone), wants IUD  Last pap 01/12/19. Results were: normal. H/O abnormal pap: yes 2017, multiple normal since Last mammogram: never. Results were: N/A. Family h/o breast cancer: yes PGM Last colonoscopy: never. Results were: N/A. Family h/o colorectal cancer: no Review of Systems:   Pertinent items are noted in HPI Denies any headaches, blurred vision, fatigue, shortness of breath, chest pain, abdominal pain, abnormal vaginal discharge/itching/odor/irritation, problems with periods, bowel movements, urination, or intercourse unless otherwise stated above. Pertinent History Reviewed:  Reviewed past medical,surgical, social and family history.  Reviewed  problem list, medications and allergies. Physical Assessment:   Vitals:   01/18/20 0924  BP: 120/72  Pulse: (!) 59  Weight: 136 lb 12.8 oz (62.1 kg)  Height: 5\' 3"  (1.6 m)  Body mass index is 24.23 kg/m.        Physical Examination:   General appearance - well appearing, and in no distress  Mental status - alert, oriented to person, place, and time  Psych:  She has a normal mood and affect  Skin - warm and dry, normal color, no suspicious lesions noted  Chest - effort normal, all lung fields clear to auscultation bilaterally  Heart - normal rate and regular rhythm  Neck:  midline trachea, no thyromegaly or nodules  Breasts - breasts appear normal, no suspicious masses, no skin or nipple changes or  axillary nodes  Abdomen - soft, nontender, nondistended, no masses or organomegaly  Pelvic - VULVA: normal appearing vulva with no masses, tenderness or lesions  VAGINA: normal appearing vagina with normal color and discharge, no lesions  CERVIX: normal appearing cervix without discharge or lesions, no CMT  Thin prep pap is not done   UTERUS: uterus is felt to be normal size, shape, consistency and nontender   ADNEXA: No adnexal masses or tenderness noted.  Extremities:  No swelling or varicosities noted  Chaperone: Amanda Rash & Angel  No results found for this or any previous visit (from the past 24 hour(s)).  Assessment & Plan:  1) Well-Woman Exam  2) Fatigue> will check cbc, tsh, vit D  3) LLQ pain mid cycle and just before period> ?[id number]  4) Contraception counseling> wants Liletta IUD, wants to contact insurance to see what they cover, will let us know if definitely wants, abstinence 2wks prior to insertion  Labs/procedures today: labs as below  Mammogram @39yo  or sooner if problems Colonoscopy @39yo  or sooner if problems  Orders Placed This Encounter  Procedures  . CBC  . TSH  . VITAMIN D 25 Hydroxy (Vit-D Deficiency, Fractures)    Meds: No orders of the  defined types were placed in this encounter.   Follow-up: Return in about 1 year (around 01/17/2021) for Physical.  [place], [facility] 01/18/2020 9:57 AM

## 2020-01-19 LAB — CBC
Hematocrit: 34.1 % (ref 34.0–46.6)
Hemoglobin: 10.5 g/dL — ABNORMAL LOW (ref 11.1–15.9)
MCH: 22.8 pg — ABNORMAL LOW (ref 26.6–33.0)
MCHC: 30.8 g/dL — ABNORMAL LOW (ref 31.5–35.7)
MCV: 74 fL — ABNORMAL LOW (ref 79–97)
Platelets: 234 10*3/uL (ref 150–450)
RBC: 4.6 x10E6/uL (ref 3.77–5.28)
RDW: 14.2 % (ref 11.7–15.4)
WBC: 5.7 10*3/uL (ref 3.4–10.8)

## 2020-01-19 LAB — TSH: TSH: 0.901 u[IU]/mL (ref 0.450–4.500)

## 2020-01-19 LAB — VITAMIN D 25 HYDROXY (VIT D DEFICIENCY, FRACTURES): Vit D, 25-Hydroxy: 34 ng/mL (ref 30.0–100.0)

## 2020-02-15 ENCOUNTER — Telehealth: Payer: Self-pay

## 2020-02-15 NOTE — Telephone Encounter (Signed)
Pt is calling to advise she started her period. We dont have available appts this week, pt is on the sch got 11-30

## 2020-02-23 ENCOUNTER — Ambulatory Visit (INDEPENDENT_AMBULATORY_CARE_PROVIDER_SITE_OTHER): Payer: Commercial Managed Care - PPO | Admitting: Women's Health

## 2020-02-23 ENCOUNTER — Other Ambulatory Visit: Payer: Self-pay

## 2020-02-23 ENCOUNTER — Encounter: Payer: Self-pay | Admitting: Women's Health

## 2020-02-23 VITALS — BP 133/79 | HR 75 | Ht 63.0 in | Wt 140.0 lb

## 2020-02-23 DIAGNOSIS — Z30430 Encounter for insertion of intrauterine contraceptive device: Secondary | ICD-10-CM | POA: Insufficient documentation

## 2020-02-23 DIAGNOSIS — Z3202 Encounter for pregnancy test, result negative: Secondary | ICD-10-CM | POA: Diagnosis not present

## 2020-02-23 DIAGNOSIS — D649 Anemia, unspecified: Secondary | ICD-10-CM

## 2020-02-23 LAB — POCT URINE PREGNANCY: Preg Test, Ur: NEGATIVE

## 2020-02-23 MED ORDER — LEVONORGESTREL 19.5 MCG/DAY IU IUD
INTRAUTERINE_SYSTEM | Freq: Once | INTRAUTERINE | Status: AC
  Administered 2020-02-23: 1 via INTRAUTERINE

## 2020-02-23 NOTE — Progress Notes (Signed)
   IUD INSERTION Patient name: Siyona Coto MRN [id number]  Date of birth: 12/30/1980 Subjective Findings:   Kaden Dunkel is a 39 y.o. [phone] Caucasian female being seen today for insertion of a Liletta IUD. Anemia dx at last visit, started on Fe. Depression screen [facility] 2/9 01/18/2020 01/12/2019 01/06/2018 12/30/2017 03/28/2017  Decreased Interest 0 0 0 0 0  Down, Depressed, Hopeless 0 0 0 0 0  PHQ - 2 Score 0 0 0 0 0  Altered sleeping 1 - - - 1  Tired, decreased energy 0 - - - 2  Change in appetite 0 - - - 0  Feeling bad or failure about yourself  0 - - - 0  Trouble concentrating 0 - - - 0  Moving slowly or fidgety/restless 0 - - - 0  Suicidal thoughts 0 - - - 0  PHQ-9 Score 1 - - - 3  Difficult doing work/chores - - - - Not difficult at all    Patient's last menstrual period was 02/12/2020 (exact date). Last sexual intercourse was >2wks ago Last pap10/19/20. Results were:  normal  The risks and benefits of the method and placement have been thouroughly reviewed with the patient and all questions were answered.  Specifically the patient is aware of failure rate of 03/998, expulsion of the IUD and of possible perforation.  The patient is aware of irregular bleeding due to the method and understands the incidence of irregular bleeding diminishes with time.  Signed copy of informed consent in chart.  Pertinent History Reviewed:   Reviewed past medical,surgical, social, obstetrical and family history.  Reviewed problem list, medications and allergies. Objective Findings & Procedure:   Vitals:   02/23/20 0931  BP: 133/79  Pulse: 75  Weight: 140 lb (63.5 kg)  Height: 5\' 3"  (1.6 m)  Body mass index is 24.8 kg/m.  Results for orders placed or performed in visit on 02/23/20 (from the past 24 hour(s))  POCT urine pregnancy   Collection Time: 02/23/20  9:34 AM  Result Value Ref Range   Preg Test, Ur Negative Negative     Time out was performed.  A graves speculum was placed in  the vagina.  The cervix was visualized, prepped using Betadine, and grasped with a single tooth tenaculum. The uterus was found to be retroflexed and it sounded to 8 cm.  Liletta  IUD placed per manufacturer's recommendations. The strings were trimmed to approximately 3 cm. The patient tolerated the procedure well.   Informal transvaginal sonogram was performed and the proper placement of the IUD was verified by me and Museum/gallery conservator, ultrasonographer  Chaperone: Celene Squibb   Assessment & Plan:   1) Liletta IUD insertion The patient was given post procedure instructions, including signs and symptoms of infection and to check for the strings after each menses or each month, and refraining from intercourse or anything in the vagina for 3 days. She was given a care card with date IUD placed, and date IUD to be removed. She is scheduled for a f/u appointment in 4 weeks.  2) Anemia> Fe QOD w/ OJ, will repeat CBC at visit in 4wks  Orders Placed This Encounter  Procedures  . POCT urine pregnancy    Return in about 4 weeks (around 03/22/2020) for IUD F/U, in person, CNM.  Sims, [facility] 02/23/2020 10:16 AM

## 2020-02-23 NOTE — Patient Instructions (Signed)
Nothing in vagina for 3 days (no sex, douching, tampons, etc...)  Check your strings once a month to make sure you can feel them, if you are not able to please let us know  If you develop a fever of 100.4 or more in the next few weeks, or if you develop severe abdominal pain, please let [place] know  Use a backup method of birth control, such as condoms, for 2 weeks     Intrauterine Device Insertion, Care After  This sheet gives you information about how to care for yourself after your procedure. Your health care provider may also give you more specific instructions. If you have problems or questions, contact your health care provider. What can I expect after the procedure? After the procedure, it is common to have:  Cramps and pain in the abdomen.  Light bleeding (spotting) or heavier bleeding that is like your menstrual period. This may last for up to a few days.  Lower back pain.  Dizziness.  Headaches.  Nausea. Follow these instructions at home:  Before resuming sexual activity, check to make sure that you can feel the IUD string(s). You should be able to feel the end of the string(s) below the opening of your cervix. If your IUD string is in place, you may resume sexual activity. ? If you had a hormonal IUD inserted more than 7 days after your most recent period started, you will need to use a backup method of birth control for 7 days after IUD insertion. Ask your health care provider whether this applies to you.  Continue to check that the IUD is still in place by feeling for the string(s) after every menstrual period, or once a month.  Take over-the-counter and prescription medicines only as told by your health care provider.  Do not drive or use heavy machinery while taking prescription pain medicine.  Keep all follow-up visits as told by your health care provider. This is important. Contact a health care provider if:  You have bleeding that is heavier or lasts longer  than a normal menstrual cycle.  You have a fever.  You have cramps or abdominal pain that get worse or do not get better with medicine.  You develop abdominal pain that is new or is not in the same area of earlier cramping and pain.  You feel lightheaded or weak.  You have abnormal or bad-smelling discharge from your vagina.  You have pain during sexual activity.  You have any of the following problems with your IUD string(s): ? The string bothers or hurts you or your sexual partner. ? You cannot feel the string. ? The string has gotten longer.  You can feel the IUD in your vagina.  You think you may be pregnant, or you miss your menstrual period.  You think you may have an STI (sexually transmitted infection). Get help right away if:  You have flu-like symptoms.  You have a fever and chills.  You can feel that your IUD has slipped out of place. Summary  After the procedure, it is common to have cramps and pain in the abdomen. It is also common to have light bleeding (spotting) or heavier bleeding that is like your menstrual period.  Continue to check that the IUD is still in place by feeling for the string(s) after every menstrual period, or once a month.  Keep all follow-up visits as told by your health care provider. This is important.  Contact your health care provider  if you have problems with your IUD string(s), such as the string getting longer or bothering you or your sexual partner. This information is not intended to replace advice given to you by your health care provider. Make sure you discuss any questions you have with your health care provider. Document Revised: 02/22/2017 Document Reviewed: 02/01/2016 Elsevier Patient Education  2020 [employer].

## 2020-03-16 ENCOUNTER — Encounter: Payer: Self-pay | Admitting: *Deleted

## 2020-03-22 ENCOUNTER — Encounter: Payer: Self-pay | Admitting: Women's Health

## 2020-03-22 ENCOUNTER — Ambulatory Visit (INDEPENDENT_AMBULATORY_CARE_PROVIDER_SITE_OTHER): Payer: Commercial Managed Care - PPO | Admitting: Women's Health

## 2020-03-22 ENCOUNTER — Other Ambulatory Visit: Payer: Self-pay

## 2020-03-22 VITALS — BP 115/72 | HR 80 | Ht 63.0 in | Wt 138.0 lb

## 2020-03-22 DIAGNOSIS — Z30431 Encounter for routine checking of intrauterine contraceptive device: Secondary | ICD-10-CM | POA: Diagnosis not present

## 2020-03-22 DIAGNOSIS — D649 Anemia, unspecified: Secondary | ICD-10-CM

## 2020-03-22 NOTE — Progress Notes (Signed)
° °  GYN VISIT Patient name: Emily Chan MRN [id number]  Date of birth: [birth year] Chief Complaint:   Follow-up (IUD)  History of Present Illness:   Emily Chan is a 39 y.o. [phone] Caucasian female being seen today for IUD f/u.   Liletta IUD inserted 02/23/20. Not able to feel strings. No pain/problems. On Fe QOD for anemia, plan was to recheck CBC today. She is asymptomatic.   Depression screen [facility] 2/9 01/18/2020 01/12/2019 01/06/2018 12/30/2017 03/28/2017  Decreased Interest 0 0 0 0 0  Down, Depressed, Hopeless 0 0 0 0 0  PHQ - 2 Score 0 0 0 0 0  Altered sleeping 1 - - - 1  Tired, decreased energy 0 - - - 2  Change in appetite 0 - - - 0  Feeling bad or failure about yourself  0 - - - 0  Trouble concentrating 0 - - - 0  Moving slowly or fidgety/restless 0 - - - 0  Suicidal thoughts 0 - - - 0  PHQ-9 Score 1 - - - 3  Difficult doing work/chores - - - - Not difficult at all    Patient's last menstrual period was 03/17/2020 (exact date). The current method of family planning is IUD.  Last pap 01/12/19. Results were:  normal Review of Systems:   Pertinent items are noted in HPI Denies fever/chills, dizziness, headaches, visual disturbances, fatigue, shortness of breath, chest pain, abdominal pain, vomiting, abnormal vaginal discharge/itching/odor/irritation, problems with periods, bowel movements, urination, or intercourse unless otherwise stated above.  Pertinent History Reviewed:  Reviewed past medical,surgical, social, obstetrical and family history.  Reviewed problem list, medications and allergies. Physical Assessment:   Vitals:   03/22/20 0853  BP: 115/72  Pulse: 80  Weight: 138 lb (62.6 kg)  Height: 5\' 3"  (1.6 m)  Body mass index is 24.45 kg/m.       Physical Examination:   General appearance: alert, well appearing, and in no distress  Mental status: alert, oriented to person, place, and time  Skin: warm & dry   Cardiovascular: normal heart rate noted  Respiratory:  normal respiratory effort, no distress  Abdomen: soft, non-tender   Pelvic: VULVA: normal appearing vulva with no masses, tenderness or lesions, VAGINA: normal appearing vagina with normal color and discharge, no lesions, CERVIX: normal appearing cervix without discharge or lesions, IUD strings visible and appropriate length  Extremities: no edema   Chaperone: Angel Neas    No results found for this or any previous visit (from the past 24 hour(s)).  Assessment & Plan:  1) IUD check> in place  2) Anemia> on Fe QOD, repeat CBC today  Meds: No orders of the defined types were placed in this encounter.   Orders Placed This Encounter  Procedures   CBC    Return in about 1 year (around 03/22/2021) for Physical.  03/24/2021 CNM, [facility]NP-BC 03/22/2020 9:16 AM

## 2020-03-23 LAB — CBC
Hematocrit: 39.7 % (ref 34.0–46.6)
Hemoglobin: 13.1 g/dL (ref 11.1–15.9)
MCH: 26.9 pg (ref 26.6–33.0)
MCHC: 33 g/dL (ref 31.5–35.7)
MCV: 82 fL (ref 79–97)
Platelets: 183 10*3/uL (ref 150–450)
RBC: 4.87 x10E6/uL (ref 3.77–5.28)
RDW: 19.7 % — ABNORMAL HIGH (ref 11.7–15.4)
WBC: 4.7 10*3/uL (ref 3.4–10.8)

## 2020-04-01 ENCOUNTER — Other Ambulatory Visit: Payer: Commercial Managed Care - PPO

## 2020-04-02 ENCOUNTER — Other Ambulatory Visit: Payer: Commercial Managed Care - PPO

## 2020-04-02 ENCOUNTER — Other Ambulatory Visit: Payer: Self-pay

## 2020-04-02 DIAGNOSIS — Z20822 Contact with and (suspected) exposure to covid-19: Secondary | ICD-10-CM

## 2020-04-05 LAB — NOVEL CORONAVIRUS, NAA: SARS-CoV-2, NAA: DETECTED — AB

## 2021-01-19 ENCOUNTER — Encounter: Payer: Self-pay | Admitting: Women's Health

## 2021-01-19 ENCOUNTER — Other Ambulatory Visit: Payer: Self-pay

## 2021-01-19 ENCOUNTER — Ambulatory Visit (INDEPENDENT_AMBULATORY_CARE_PROVIDER_SITE_OTHER): Payer: Commercial Managed Care - PPO | Admitting: Women's Health

## 2021-01-19 VITALS — BP 126/73 | HR 73 | Ht 63.0 in | Wt 143.0 lb

## 2021-01-19 DIAGNOSIS — Z01419 Encounter for gynecological examination (general) (routine) without abnormal findings: Secondary | ICD-10-CM

## 2021-01-19 DIAGNOSIS — N6320 Unspecified lump in the left breast, unspecified quadrant: Secondary | ICD-10-CM

## 2021-01-19 NOTE — Progress Notes (Signed)
WELL-WOMAN EXAMINATION Patient name: Emily Chan MRN [id number]  Date of birth: 1980/05/10 Chief Complaint:   Gynecologic Exam (Last pap 01/12/19  normal)  History of Present Illness:   Emily Chan is a 40 y.o. [phone] Caucasian female being seen today for a routine well-woman exam.  Current complaints: none  PCP: none      does not desire labs Patient's last menstrual period was 12/30/2020. The current method of family planning is IUD, Liletta inserted 02/23/20 Last pap 01/12/19. Results were: NILM w/ HRHPV negative. H/O abnormal pap: yes 2017, multiple normals since Last mammogram: never. Results were: N/A. Family h/o breast cancer: yes PGM Last colonoscopy: never. Results were: N/A. Family h/o colorectal cancer: no  Depression screen [facility] 2/9 01/19/2021 01/18/2020 01/12/2019 01/06/2018 12/30/2017  Decreased Interest 0 0 0 0 0  Down, Depressed, Hopeless 0 0 0 0 0  PHQ - 2 Score 0 0 0 0 0  Altered sleeping 0 1 - - -  Tired, decreased energy 0 0 - - -  Change in appetite 0 0 - - -  Feeling bad or failure about yourself  0 0 - - -  Trouble concentrating 0 0 - - -  Moving slowly or fidgety/restless 0 0 - - -  Suicidal thoughts 0 0 - - -  PHQ-9 Score 0 1 - - -  Difficult doing work/chores - - - - -     GAD 7 : Generalized Anxiety Score 01/19/2021 01/18/2020  Nervous, Anxious, on Edge 0 0  Control/stop worrying 0 0  Worry too much - different things 0 1  Trouble relaxing 0 1  Restless 0 0  Easily annoyed or irritable 0 1  Afraid - awful might happen 0 1  Total GAD 7 Score 0 4     Review of Systems:   Pertinent items are noted in HPI Denies any headaches, blurred vision, fatigue, shortness of breath, chest pain, abdominal pain, abnormal vaginal discharge/itching/odor/irritation, problems with periods, bowel movements, urination, or intercourse unless otherwise stated above. Pertinent History Reviewed:  Reviewed past medical,surgical, social and family history.   Reviewed problem list, medications and allergies. Physical Assessment:   Vitals:   01/19/21 0919  BP: 126/73  Pulse: 73  Weight: 143 lb (64.9 kg)  Height: 5\' 3"  (1.6 m)  Body mass index is 25.33 kg/m.        Physical Examination:   General appearance - well appearing, and in no distress  Mental status - alert, oriented to person, place, and time  Psych:  She has a normal mood and affect  Skin - warm and dry, normal color, no suspicious lesions noted  Chest - effort normal, all lung fields clear to auscultation bilaterally  Heart - normal rate and regular rhythm  Neck:  midline trachea, no thyromegaly or nodules  Breasts - Rt breast appears normal, no suspicious masses, no skin or nipple changes or axillary nodes; Lt breast 1cm firm nontender mass 3 o'clock 95fb from nipple  Abdomen - soft, nontender, nondistended, no masses or organomegaly  Pelvic - VULVA: normal appearing vulva with no masses, tenderness or lesions  VAGINA: normal appearing vagina with normal color and discharge, no lesions  CERVIX: normal appearing cervix without discharge or lesions, no CMT  Thin prep pap is not done HR HPV cotesting  UTERUS: uterus is felt to be normal size, shape, consistency and nontender   ADNEXA: No adnexal masses or tenderness noted.  Extremities:  No swelling or varicosities noted  Chaperone:  Levy Pupa    No results found for this or any previous visit (from the past 24 hour(s)).  Assessment & Plan:  1) Well-Woman Exam  2) Lt breast mass> will get breast u/s & mammo, orders placed, note routed to [facility] to schedule and notify pt  Labs/procedures today: exam  Mammogram: diagnostic mammo and breast u/s ordered, or sooner if problems Colonoscopy: @ 39yo, or sooner if problems  Orders Placed This Encounter  Procedures   MM DIAG BREAST TOMO BILATERAL   US BREAST LTD UNI LEFT INC AXILLA   US BREAST LTD UNI RIGHT INC AXILLA    Meds: No orders of the defined types were placed in  this encounter.   Follow-up: Return in about 1 year (around 01/19/2022) for Pap & physical.  [name] CNM, [facility] 01/19/2021 9:56 AM

## 2021-02-28 ENCOUNTER — Other Ambulatory Visit: Payer: Self-pay

## 2021-02-28 ENCOUNTER — Other Ambulatory Visit (HOSPITAL_COMMUNITY): Payer: Self-pay | Admitting: Women's Health

## 2021-02-28 ENCOUNTER — Encounter (HOSPITAL_COMMUNITY): Payer: Self-pay

## 2021-02-28 ENCOUNTER — Ambulatory Visit (HOSPITAL_COMMUNITY)
Admission: RE | Admit: 2021-02-28 | Discharge: 2021-02-28 | Disposition: A | Discharge status: Home / Self Care | Payer: Commercial Managed Care - PPO | Source: Ambulatory Visit | Attending: Women's Health | Admitting: Women's Health

## 2021-02-28 DIAGNOSIS — N6320 Unspecified lump in the left breast, unspecified quadrant: Secondary | ICD-10-CM | POA: Diagnosis present

## 2021-02-28 DIAGNOSIS — R928 Other abnormal and inconclusive findings on diagnostic imaging of breast: Secondary | ICD-10-CM

## 2021-03-02 ENCOUNTER — Other Ambulatory Visit: Payer: Self-pay

## 2021-03-02 ENCOUNTER — Ambulatory Visit (HOSPITAL_COMMUNITY)
Admission: RE | Admit: 2021-03-02 | Discharge: 2021-03-02 | Disposition: A | Discharge status: Home / Self Care | Payer: Commercial Managed Care - PPO | Source: Ambulatory Visit | Attending: Women's Health | Admitting: Women's Health

## 2021-03-02 ENCOUNTER — Other Ambulatory Visit (HOSPITAL_COMMUNITY): Payer: Self-pay | Admitting: Women's Health

## 2021-03-02 DIAGNOSIS — R928 Other abnormal and inconclusive findings on diagnostic imaging of breast: Secondary | ICD-10-CM

## 2021-03-02 DIAGNOSIS — D242 Benign neoplasm of left breast: Secondary | ICD-10-CM | POA: Insufficient documentation

## 2021-03-03 LAB — SURGICAL PATHOLOGY

## 2021-09-20 ENCOUNTER — Ambulatory Visit: Payer: Commercial Managed Care - PPO | Admitting: Emergency Medicine

## 2021-10-30 ENCOUNTER — Encounter: Payer: Self-pay | Admitting: Women's Health

## 2021-10-30 ENCOUNTER — Other Ambulatory Visit (HOSPITAL_COMMUNITY)
Admission: RE | Admit: 2021-10-30 | Discharge: 2021-10-30 | Disposition: A | Discharge status: Home / Self Care | Payer: Commercial Managed Care - PPO | Source: Ambulatory Visit | Attending: Women's Health | Admitting: Women's Health

## 2021-10-30 ENCOUNTER — Ambulatory Visit (INDEPENDENT_AMBULATORY_CARE_PROVIDER_SITE_OTHER): Payer: Commercial Managed Care - PPO | Admitting: Women's Health

## 2021-10-30 VITALS — BP 123/84 | HR 71 | Ht 63.0 in | Wt 150.0 lb

## 2021-10-30 DIAGNOSIS — R102 Pelvic and perineal pain: Secondary | ICD-10-CM | POA: Diagnosis present

## 2021-10-30 NOTE — Progress Notes (Signed)
GYN VISIT Patient name: Emily Chan MRN [id number]  Date of birth: [birth year] Chief Complaint:   Pelvic Pain (Started Thursday night, pelvic cramping and pressure. )  History of Present Illness:   Shamika Pedregon is a 41 y.o. [phone] Caucasian female being seen today for report of pelvic cramping and pressure since Thurs, worse on Friday. Denies abnormal discharge, itching/odor/irritation.  No sex in about a year. Not having periods w/ IUD, sometimes may spot a little, some breast tenderness. None right now.     No LMP recorded. (Menstrual status: IUD). The current method of family planning is IUD Mirena 02/23/20 Last pap 01/12/19. Results were: NILM w/ HRHPV negative     01/19/2021    9:22 AM 01/18/2020    9:27 AM 01/12/2019   11:45 AM 01/06/2018    3:03 PM 12/30/2017   11:04 AM  Depression screen PHQ 2/9  Decreased Interest 0 0 0 0 0  Down, Depressed, Hopeless 0 0 0 0 0  PHQ - 2 Score 0 0 0 0 0  Altered sleeping 0 1     Tired, decreased energy 0 0     Change in appetite 0 0     Feeling bad or failure about yourself  0 0     Trouble concentrating 0 0     Moving slowly or fidgety/restless 0 0     Suicidal thoughts 0 0     PHQ-9 Score 0 1           01/19/2021    9:22 AM 01/18/2020    9:28 AM  GAD 7 : Generalized Anxiety Score  Nervous, Anxious, on Edge 0 0  Control/stop worrying 0 0  Worry too much - different things 0 1  Trouble relaxing 0 1  Restless 0 0  Easily annoyed or irritable 0 1  Afraid - awful might happen 0 1  Total GAD 7 Score 0 4     Review of Systems:   Pertinent items are noted in HPI Denies fever/chills, dizziness, headaches, visual disturbances, fatigue, shortness of breath, chest pain, abdominal pain, vomiting, abnormal vaginal discharge/itching/odor/irritation, problems with periods, bowel movements, urination, or intercourse unless otherwise stated above.  Pertinent History Reviewed:  Reviewed past medical,surgical, social, obstetrical and  family history.  Reviewed problem list, medications and allergies. Physical Assessment:   Vitals:   10/30/21 1405  BP: 123/84  Pulse: 71  Weight: 150 lb (68 kg)  Height: '5\' 3"'$  (1.6 m)  Body mass index is 26.57 kg/m.       Physical Examination:   General appearance: alert, well appearing, and in no distress  Mental status: alert, oriented to person, place, and time  Skin: warm & dry   Cardiovascular: normal heart rate noted  Respiratory: normal respiratory effort, no distress  Abdomen: soft, non-tender   Pelvic: VULVA: normal appearing vulva with no masses, tenderness or lesions, VAGINA: normal appearing vagina with normal color and discharge, no lesions, CERVIX: normal appearing cervix without discharge or lesions, IUD strings visible, tucked behind cx at 6 o'clock UTERUS: uterus is normal size, shape, consistency and nontender, ADNEXA: normal adnexa in size, nontender and no masses  Extremities: no edema   Chaperone: Celene Squibb    No results found for this or any previous visit (from the past 24 hour(s)).  Assessment & Plan:  1) Pelvic cramping & pressure x4-5d> CV swab, if neg and pain continues will get pelvic u/s  Meds: No orders of the defined types were placed in  this encounter.   No orders of the defined types were placed in this encounter.   Return for 10/28 or after for , Pap & physical w/ me.  [name] CNM, [facility] 10/30/2021 2:31 PM

## 2021-11-01 LAB — CERVICOVAGINAL ANCILLARY ONLY
Bacterial Vaginitis (gardnerella): NEGATIVE
Candida Glabrata: NEGATIVE
Candida Vaginitis: POSITIVE — AB
Chlamydia: NEGATIVE
Comment: NEGATIVE
Comment: NEGATIVE
Comment: NEGATIVE
Comment: NEGATIVE
Comment: NEGATIVE
Comment: NORMAL
Neisseria Gonorrhea: NEGATIVE
Trichomonas: NEGATIVE

## 2021-11-07 MED ORDER — FLUCONAZOLE 150 MG PO TABS: 150.0000 mg | ORAL_TABLET | Freq: Once | ORAL | 0 refills | Status: AC

## 2021-11-07 NOTE — Addendum Note (Signed)
Addended by: Wells Guiles R on: 11/07/2021 01:26 PM   Modules accepted: Orders

## 2021-11-21 ENCOUNTER — Encounter: Payer: Self-pay | Admitting: Emergency Medicine

## 2021-11-21 ENCOUNTER — Ambulatory Visit (INDEPENDENT_AMBULATORY_CARE_PROVIDER_SITE_OTHER): Payer: Commercial Managed Care - PPO | Admitting: Emergency Medicine

## 2021-11-21 VITALS — BP 120/72 | HR 83 | Temp 98.6°F | Ht 63.0 in | Wt 152.4 lb

## 2021-11-21 DIAGNOSIS — Z1329 Encounter for screening for other suspected endocrine disorder: Secondary | ICD-10-CM | POA: Diagnosis not present

## 2021-11-21 DIAGNOSIS — Z13228 Encounter for screening for other metabolic disorders: Secondary | ICD-10-CM | POA: Diagnosis not present

## 2021-11-21 DIAGNOSIS — Z0000 Encounter for general adult medical examination without abnormal findings: Secondary | ICD-10-CM | POA: Diagnosis not present

## 2021-11-21 DIAGNOSIS — Z1159 Encounter for screening for other viral diseases: Secondary | ICD-10-CM

## 2021-11-21 DIAGNOSIS — Z13220 Encounter for screening for lipoid disorders: Secondary | ICD-10-CM

## 2021-11-21 DIAGNOSIS — Z130 Encounter for screening for diseases of the blood and blood-forming organs and certain disorders involving the immune mechanism: Secondary | ICD-10-CM

## 2021-11-21 DIAGNOSIS — Z23 Encounter for immunization: Secondary | ICD-10-CM | POA: Diagnosis not present

## 2021-11-21 DIAGNOSIS — Z7689 Persons encountering health services in other specified circumstances: Secondary | ICD-10-CM

## 2021-11-21 LAB — CBC WITH DIFFERENTIAL/PLATELET
Basophils Absolute: 0 10*3/uL (ref 0.0–0.1)
Basophils Relative: 0.4 % (ref 0.0–3.0)
Eosinophils Absolute: 0.1 10*3/uL (ref 0.0–0.7)
Eosinophils Relative: 0.9 % (ref 0.0–5.0)
HCT: 40.1 % (ref 36.0–46.0)
Hemoglobin: 13.5 g/dL (ref 12.0–15.0)
Lymphocytes Relative: 24.2 % (ref 12.0–46.0)
Lymphs Abs: 2.2 10*3/uL (ref 0.7–4.0)
MCHC: 33.8 g/dL (ref 30.0–36.0)
MCV: 87.7 fl (ref 78.0–100.0)
Monocytes Absolute: 0.6 10*3/uL (ref 0.1–1.0)
Monocytes Relative: 6.8 % (ref 3.0–12.0)
Neutro Abs: 6.2 10*3/uL (ref 1.4–7.7)
Neutrophils Relative %: 67.7 % (ref 43.0–77.0)
Platelets: 182 10*3/uL (ref 150.0–400.0)
RBC: 4.57 Mil/uL (ref 3.87–5.11)
RDW: 13.7 % (ref 11.5–15.5)
WBC: 9.2 10*3/uL (ref 4.0–10.5)

## 2021-11-21 LAB — HEMOGLOBIN A1C: Hgb A1c MFr Bld: 5.2 % (ref 4.6–6.5)

## 2021-11-21 NOTE — Patient Instructions (Signed)

## 2021-11-21 NOTE — Progress Notes (Signed)
Emily Chan 41 y.o.   Chief Complaint  Patient presents with   New Patient (Initial Visit)    Patient thinks she has a hernia, abd issues , pt requesting CPE     HISTORY OF PRESENT ILLNESS: This is a 41 y.o. female first visit to this office, here to establish care with me. Requesting physical. Healthy female with a healthy lifestyle. Non-smoker.  Exercises regularly Married mother of 2. Recent office visit with gynecologist without any concerns. Occasionally has epigastric discomfort.  May have hiatal hernia.  HPI   Prior to Admission medications   Medication Sig Start Date End Date Taking? Authorizing Provider  Ferrous Sulfate (FE-CAPS PO) Take by mouth.   Yes [provider]  levonorgestrel (LILETTA, 52 MG,) 19.5 MCG/DAY IUD IUD 1 each by Intrauterine route once.   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    Past Medical History:  Diagnosis Date   Abdominal cramping affecting pregnancy 08/24/2014   Abnormal Pap smear    Bleeding in early pregnancy 09/02/2014   Pelvic rest   Contraception management 06/16/2012   Contraceptive management 05/03/2015   Hemorrhoids 05/03/2015   History of abnormal cervical Pap smear 08/18/2015   Hx: UTI (urinary tract infection)    Miscarriage    Numbness in both legs 08/18/2015   Has numbness and tingling in both legs since epidural, on and off esp at night   Pregnant 01/28/2014   Vaginal irritation 12/25/2012   Vaginal Pap smear, abnormal    Yeast infection 12/25/2012    Past Surgical History:  Procedure Laterality Date   APPENDECTOMY     WISDOM TOOTH EXTRACTION      Social History   Socioeconomic History   Marital status: Married    Spouse name: Not on file   Number of children: Not on file   Years  of education: Not on file   Highest education level: Not on file  Occupational History   Not on file  Tobacco Use   Smoking status: Never   Smokeless tobacco: Never  Vaping Use   Vaping Use: Never used  Substance and Sexual Activity   Alcohol use: No    Comment: occ.   Drug use: No   Sexual activity: Yes    Birth control/protection: I.U.D.  Other Topics Concern   Not on file  Social History Narrative   Not on file   Social Determinants of Health   Financial Resource Strain: Low Risk  (01/19/2021)   Overall Financial Resource Strain (CARDIA)    Difficulty of Paying Living Expenses: Not hard at all  Food Insecurity: No Food Insecurity (01/19/2021)   Hunger Vital Sign    Worried About Running Out of Food in the Last Year: Never true    Ran Out of Food in the Last Year: Never true  Transportation Needs: No Transportation Needs (01/19/2021)   PRAPARE - Hydrologist (Medical): No    Lack of Transportation (Non-Medical): No  Physical Activity: Insufficiently Active (01/19/2021)   Exercise Vital Sign    Days of Exercise per Week: 2 days    Minutes of Exercise per Session: 20 min  Stress: No Stress Concern Present (01/19/2021)   [place]  Questionnaire    Feeling of Stress : Only a little  Social Connections: Socially Integrated (01/19/2021)   Social Connection and Isolation Panel [NHANES]    Frequency of Communication with Friends and Family: More than three times a week    Frequency of Social Gatherings with Friends and Family: More than three times a week    Attends Religious Services: 1 to 4 times per year    Active Member of [employer] or Organizations: Yes    Attends Archivist Meetings: Never    Marital Status: Married  Human resources officer Violence: Not At Risk (01/19/2021)   Humiliation, Afraid, Rape, and Kick questionnaire    Fear of Current or Ex-Partner: No    Emotionally Abused: No     Physically Abused: No    Sexually Abused: No    Family History  Problem Relation Age of Onset   Cancer Paternal Aunt        melenoma   Diabetes Maternal Grandmother    Hypertension Maternal Grandmother    Cancer Maternal Grandfather        Brain and Lung   Diabetes Maternal Grandfather    Cancer Paternal Grandmother        breast   Cancer Maternal Uncle      Review of Systems  Constitutional: Negative.  Negative for chills and fever.  HENT: Negative.  Negative for congestion and sore throat.   Respiratory: Negative.  Negative for cough and shortness of breath.   Cardiovascular: Negative.  Negative for chest pain and palpitations.  Gastrointestinal:  Negative for abdominal pain, nausea and vomiting.  Genitourinary: Negative.   Skin: Negative.  Negative for rash.  Neurological:  Negative for dizziness and headaches.  All other systems reviewed and are negative.  Today's Vitals   11/21/21 1435  BP: 120/72  Pulse: 83  Temp: 98.6 F (37 C)  TempSrc: Oral  SpO2: 98%  Weight: 152 lb 6 oz (69.1 kg)  Height: '5\' 3"'$  (1.6 m)   Body mass index is 26.99 kg/m.   Physical Exam Vitals reviewed.  Constitutional:      Appearance: Normal appearance.  HENT:     Head: Normocephalic.     Right Ear: Tympanic membrane, ear canal and external ear normal.     Left Ear: Tympanic membrane, ear canal and external ear normal.     Mouth/Throat:     Mouth: Mucous membranes are moist.     Pharynx: Oropharynx is clear.  Eyes:     Extraocular Movements: Extraocular movements intact.     Conjunctiva/sclera: Conjunctivae normal.     Pupils: Pupils are equal, round, and reactive to light.  Cardiovascular:     Rate and Rhythm: Normal rate and regular rhythm.     Pulses: Normal pulses.     Heart sounds: Normal heart sounds.  Pulmonary:     Effort: Pulmonary effort is normal.     Breath sounds: Normal breath sounds.  Abdominal:     General: There is no distension.     Palpations:  Abdomen is soft.     Tenderness: There is no abdominal tenderness. There is no guarding.  Musculoskeletal:     Cervical back: No tenderness.     Right lower leg: No edema.     Left lower leg: No edema.  Lymphadenopathy:     Cervical: No cervical adenopathy.  Skin:    General: Skin is warm and dry.     Capillary Refill: Capillary refill takes less than 2 seconds.  Neurological:     General: No focal deficit present.     Mental Status: She is alert and oriented to person, place, and time.  Psychiatric:        Mood and Affect: Mood normal.        Behavior: Behavior normal.      ASSESSMENT & PLAN: Problem List Items Addressed This Visit   None Visit Diagnoses     Routine general medical examination at a health care facility    -  Primary   Need for vaccination       Relevant Orders   Flu Vaccine QUAD 6+ mos PF IM (Fluarix Quad PF) (Completed)   Need for hepatitis C screening test       Relevant Orders   Hepatitis C antibody screen   Screening for deficiency anemia       Relevant Orders   CBC with Differential   Screening for lipoid disorders       Relevant Orders   Lipid panel   Screening for endocrine, metabolic and immunity disorder       Relevant Orders   Comprehensive metabolic panel   Hemoglobin A1c   Encounter to establish care          Modifiable risk factors discussed with patient. Anticipatory guidance according to age provided. The following topics were also discussed: Social Determinants of Health Smoking.  Non-smoker Diet and nutrition.  Good eating habits Benefits of exercise.  Exercises regularly Cancer family history review Vaccinations recommendations Cardiovascular risk assessment and need for blood work Mental health including depression and anxiety Fall and accident prevention  Patient Instructions  Health Maintenance, Female Adopting a healthy lifestyle and getting preventive care are important in promoting health and wellness. Ask your  health care provider about: The right schedule for you to have regular tests and exams. Things you can do on your own to prevent diseases and keep yourself healthy. What should I know about diet, weight, and exercise? Eat a healthy diet  Eat a diet that includes plenty of vegetables, fruits, low-fat dairy products, and lean protein. Do not eat a lot of foods that are high in solid fats, added sugars, or sodium. Maintain a healthy weight Body mass index (BMI) is used to identify weight problems. It estimates body fat based on height and weight. Your health care provider can help determine your BMI and help you achieve or maintain a healthy weight. Get regular exercise Get regular exercise. This is one of the most important things you can do for your health. Most adults should: Exercise for at least 150 minutes each week. The exercise should increase your heart rate and make you sweat (moderate-intensity exercise). Do strengthening exercises at least twice a week. This is in addition to the moderate-intensity exercise. Spend less time sitting. Even light physical activity can be beneficial. Watch cholesterol and blood lipids Have your blood tested for lipids and cholesterol at 40 years of age, then have this test every 5 years. Have your cholesterol levels checked more often if: Your lipid or cholesterol levels are high. You are older than 40 years of age. You are at high risk for heart disease. What should I know about cancer screening? Depending on your health history and family history, you may need to have cancer screening at various ages. This may include screening for: Breast cancer. Cervical cancer. Colorectal cancer. Skin cancer. Lung cancer. What should I know about heart disease, diabetes, and high blood pressure? Blood pressure and heart  disease High blood pressure causes heart disease and increases the risk of stroke. This is more likely to develop in people who have high  blood pressure readings or are overweight. Have your blood pressure checked: Every 3-5 years if you are 36-73 years of age. Every year if you are 21 years old or older. Diabetes Have regular diabetes screenings. This checks your fasting blood sugar level. Have the screening done: Once every three years after age 43 if you are at a normal weight and have a low risk for diabetes. More often and at a younger age if you are overweight or have a high risk for diabetes. What should I know about preventing infection? Hepatitis B If you have a higher risk for hepatitis B, you should be screened for this virus. Talk with your health care provider to find out if you are at risk for hepatitis B infection. Hepatitis C Testing is recommended for: Everyone born from [birth year] through 1964. Anyone with known risk factors for hepatitis C. Sexually transmitted infections (STIs) Get screened for STIs, including gonorrhea and chlamydia, if: You are sexually active and are younger than 40 years of age. You are older than 40 years of age and your health care provider tells you that you are at risk for this type of infection. Your sexual activity has changed since you were last screened, and you are at increased risk for chlamydia or gonorrhea. Ask your health care provider if you are at risk. Ask your health care provider about whether you are at high risk for HIV. Your health care provider may recommend a prescription medicine to help prevent HIV infection. If you choose to take medicine to prevent HIV, you should first get tested for HIV. You should then be tested every 3 months for as long as you are taking the medicine. Pregnancy If you are about to stop having your period (premenopausal) and you may become pregnant, seek counseling before you get pregnant. Take 400 to 800 micrograms (mcg) of folic acid every day if you become pregnant. Ask for birth control (contraception) if you want to prevent  pregnancy. Osteoporosis and menopause Osteoporosis is a disease in which the bones lose minerals and strength with aging. This can result in bone fractures. If you are 3 years old or older, or if you are at risk for osteoporosis and fractures, ask your health care provider if you should: Be screened for bone loss. Take a calcium or vitamin D supplement to lower your risk of fractures. Be given hormone replacement therapy (HRT) to treat symptoms of menopause. Follow these instructions at home: Alcohol use Do not drink alcohol if: Your health care provider tells you not to drink. You are pregnant, may be pregnant, or are planning to become pregnant. If you drink alcohol: Limit how much you have to: 0-1 drink a day. Know how much alcohol is in your drink. In the [place] one drink equals one 12 oz bottle of beer (355 mL), one 5 oz glass of wine (148 mL), or one 1 oz glass of hard liquor (44 mL). Lifestyle Do not use any products that contain nicotine or tobacco. These products include cigarettes, chewing tobacco, and vaping devices, such as e-cigarettes. If you need help quitting, ask your health care provider. Do not use street drugs. Do not share needles. Ask your health care provider for help if you need support or information about quitting drugs. General instructions Schedule regular health, dental, and eye exams. Stay current with your  vaccines. Tell your health care provider if: You often feel depressed. You have ever been abused or do not feel safe at home. Summary Adopting a healthy lifestyle and getting preventive care are important in promoting health and wellness. Follow your health care provider's instructions about healthy diet, exercising, and getting tested or screened for diseases. Follow your health care provider's instructions on monitoring your cholesterol and blood pressure. This information is not intended to replace advice given to you by your health care provider.  Make sure you discuss any questions you have with your health care provider. Document Revised: 08/01/2020 Document Reviewed: 08/01/2020 Elsevier Patient Education  [place], MD [facility]

## 2021-11-22 LAB — COMPREHENSIVE METABOLIC PANEL
ALT: 16 U/L (ref 0–35)
AST: 22 U/L (ref 0–37)
Albumin: 4.3 g/dL (ref 3.5–5.2)
Alkaline Phosphatase: 35 U/L — ABNORMAL LOW (ref 39–117)
BUN: 17 mg/dL (ref 6–23)
CO2: 26 mEq/L (ref 19–32)
Calcium: 9.1 mg/dL (ref 8.4–10.5)
Chloride: 104 mEq/L (ref 96–112)
Creatinine, Ser: 0.93 mg/dL (ref 0.40–1.20)
GFR: 77.3 mL/min (ref >60.00)
Glucose, Bld: 89 mg/dL (ref 70–99)
Potassium: 3.7 mEq/L (ref 3.5–5.1)
Sodium: 139 mEq/L (ref 135–145)
Total Bilirubin: 0.6 mg/dL (ref 0.2–1.2)
Total Protein: 7 g/dL (ref 6.0–8.3)

## 2021-11-22 LAB — HEPATITIS C ANTIBODY: Hepatitis C Ab: NONREACTIVE

## 2021-11-22 LAB — LIPID PANEL
Cholesterol: 174 mg/dL (ref 0–200)
HDL: 69.8 mg/dL (ref >39.00)
LDL Cholesterol: 90 mg/dL (ref 0–99)
NonHDL: 103.84
Total CHOL/HDL Ratio: 2
Triglycerides: 69 mg/dL (ref 0.0–149.0)
VLDL: 13.8 mg/dL (ref 0.0–40.0)

## 2021-11-29 ENCOUNTER — Telehealth: Payer: Self-pay | Admitting: Emergency Medicine

## 2021-11-29 NOTE — Telephone Encounter (Signed)
Form received, waiting provider signature

## 2021-11-29 NOTE — Telephone Encounter (Signed)
Patient came by and said her work needed a form filled out for her CPE, She left up front and I placed in providers box up front. Call when ready [phone]

## 2021-12-01 NOTE — Telephone Encounter (Signed)
Called patient and informed her that her form was ready for pick up. Patient will be here next week. Form is at the front desk

## 2021-12-05 NOTE — Telephone Encounter (Signed)
Patient picked up form

## 2022-01-22 ENCOUNTER — Ambulatory Visit (INDEPENDENT_AMBULATORY_CARE_PROVIDER_SITE_OTHER): Payer: Commercial Managed Care - PPO | Admitting: Women's Health

## 2022-01-22 ENCOUNTER — Encounter: Payer: Self-pay | Admitting: Women's Health

## 2022-01-22 ENCOUNTER — Other Ambulatory Visit (HOSPITAL_COMMUNITY)
Admission: RE | Admit: 2022-01-22 | Discharge: 2022-01-22 | Disposition: A | Discharge status: Home / Self Care | Payer: Commercial Managed Care - PPO | Source: Ambulatory Visit | Attending: Women's Health | Admitting: Women's Health

## 2022-01-22 VITALS — BP 118/80 | HR 69 | Ht 63.0 in | Wt 145.0 lb

## 2022-01-22 DIAGNOSIS — Z01419 Encounter for gynecological examination (general) (routine) without abnormal findings: Secondary | ICD-10-CM | POA: Diagnosis not present

## 2022-01-22 DIAGNOSIS — Z1231 Encounter for screening mammogram for malignant neoplasm of breast: Secondary | ICD-10-CM

## 2022-01-22 NOTE — Progress Notes (Signed)
WELL-WOMAN EXAMINATION Patient name: Emily Chan MRN [id number]  Date of birth: 01-Aug-1980 Chief Complaint:   Annual Exam  History of Present Illness:   Emily Chan is a 41 y.o. [phone] Caucasian female being seen today for a routine well-woman exam.  Current complaints: none  PCP: [facility] in [place]      does not desire labs No LMP recorded. (Menstrual status: IUD). The current method of family planning is IUD, Liletta inserted 02/23/20 Last pap 01/12/19. Results were: NILM w/ HRHPV negative. H/O abnormal pap: yes 2017 w/ multiple normals since Last mammogram: 02/28/21. Results were: abnormal indeterminate mass Lt breast 2 o'clock 2cm from nipple, bx revealed fibroadenoma . Family h/o breast cancer: yes PGM Last colonoscopy: never. Results were: N/A. Family h/o colorectal cancer: no     01/22/2022   11:26 AM 11/21/2021    2:38 PM 01/19/2021    9:22 AM 01/18/2020    9:27 AM 01/12/2019   11:45 AM  Depression screen PHQ 2/9  Decreased Interest 0 0 0 0 0  Down, Depressed, Hopeless 0 0 0 0 0  PHQ - 2 Score 0 0 0 0 0  Altered sleeping 0  0 1   Tired, decreased energy 0  0 0   Change in appetite 0  0 0   Feeling bad or failure about yourself  0  0 0   Trouble concentrating 0  0 0   Moving slowly or fidgety/restless 0  0 0   Suicidal thoughts 0  0 0   PHQ-9 Score 0  0 1         01/22/2022   11:26 AM 01/19/2021    9:22 AM 01/18/2020    9:28 AM  GAD 7 : Generalized Anxiety Score  Nervous, Anxious, on Edge 0 0 0  Control/stop worrying 0 0 0  Worry too much - different things 0 0 1  Trouble relaxing 0 0 1  Restless 0 0 0  Easily annoyed or irritable 0 0 1  Afraid - awful might happen 0 0 1  Total GAD 7 Score 0 0 4     Review of Systems:   Pertinent items are noted in HPI Denies any headaches, blurred vision, fatigue, shortness of breath, chest pain, abdominal pain, abnormal vaginal discharge/itching/odor/irritation, problems with periods, bowel movements, urination,  or intercourse unless otherwise stated above. Pertinent History Reviewed:  Reviewed past medical,surgical, social and family history.  Reviewed problem list, medications and allergies. Physical Assessment:   Vitals:   01/22/22 1128  BP: 118/80  Pulse: 69  Weight: 145 lb (65.8 kg)  Height: '5\' 3"'$  (1.6 m)  Body mass index is 25.69 kg/m.        Physical Examination:   General appearance - well appearing, and in no distress  Mental status - alert, oriented to person, place, and time  Psych:  She has a normal mood and affect  Skin - warm and dry, normal color, no suspicious lesions noted  Chest - effort normal, all lung fields clear to auscultation bilaterally  Heart - normal rate and regular rhythm  Neck:  midline trachea, no thyromegaly or nodules  Breasts - Rt breast appears normal, no suspicious masses, no skin or nipple changes or  axillary nodes, Lt breast 1cm firm nontender mass 2 o'clock ~2cm from nipple (c/w known fibroadenoma)  Abdomen - soft, nontender, nondistended, no masses or organomegaly  Pelvic - VULVA: normal appearing vulva with no masses, tenderness or lesions  VAGINA: normal appearing vagina  with normal color and discharge, no lesions  CERVIX: normal appearing cervix without discharge or lesions, no CMT  Thin prep pap is done w/ HR HPV cotesting  UTERUS: uterus is felt to be normal size, shape, consistency and nontender   ADNEXA: No adnexal masses or tenderness noted.  Extremities:  No swelling or varicosities noted  Chaperone: Andrez Grime    No results found for this or any previous visit (from the past 24 hour(s)).  Assessment & Plan:  1) Well-Woman Exam  2) Known fibroadenoma Lt breast  Labs/procedures today: pap  Mammogram: @ 40yo, order placed, pt to schedule for after 11/30 when she turns 41yo Colonoscopy: @ 40yo, or sooner if problems  Orders Placed This Encounter  Procedures   MM 3D SCREEN BREAST BILATERAL    Meds: No orders of the defined  types were placed in this encounter.   Follow-up: Return in about 1 year (around 01/23/2023) for Physical.  [place], WHNP-BC 01/22/2022 12:02 PM

## 2022-01-22 NOTE — Patient Instructions (Addendum)
Call [phone] to schedule your mammogram for after 11/30

## 2022-01-22 NOTE — Addendum Note (Signed)
Addended by: Jesusita Oka on: 01/22/2022 12:12 PM   Modules accepted: Orders

## 2022-01-24 LAB — CYTOLOGY - PAP
Comment: NEGATIVE
Diagnosis: NEGATIVE
Diagnosis: REACTIVE
High risk HPV: NEGATIVE

## 2022-02-21 ENCOUNTER — Encounter: Payer: Self-pay | Admitting: Emergency Medicine

## 2022-02-21 ENCOUNTER — Ambulatory Visit (INDEPENDENT_AMBULATORY_CARE_PROVIDER_SITE_OTHER): Payer: Commercial Managed Care - PPO | Admitting: Emergency Medicine

## 2022-02-21 VITALS — BP 122/72 | HR 67 | Temp 98.0°F | Ht 63.0 in | Wt 149.2 lb

## 2022-02-21 DIAGNOSIS — R350 Frequency of micturition: Secondary | ICD-10-CM | POA: Diagnosis not present

## 2022-02-21 DIAGNOSIS — R300 Dysuria: Secondary | ICD-10-CM | POA: Insufficient documentation

## 2022-02-21 LAB — POCT URINALYSIS DIPSTICK
Bilirubin, UA: NEGATIVE
Blood, UA: NEGATIVE
Glucose, UA: NEGATIVE
Ketones, UA: NEGATIVE
Leukocytes, UA: NEGATIVE
Nitrite, UA: NEGATIVE
Protein, UA: NEGATIVE
Spec Grav, UA: 1.02 (ref 1.010–1.025)
Urobilinogen, UA: 0.2 E.U./dL
pH, UA: 6 (ref 5.0–8.0)

## 2022-02-21 NOTE — Progress Notes (Signed)
Emily Chan 41 y.o.   Chief Complaint  Patient presents with   Acute Visit    Urinary retention, urinary frequency, dsyuria, burning during urination     HISTORY OF PRESENT ILLNESS: This is a 41 y.o. female developed vaginal itching with white discharge along with burning on urination last Sunday.  Took antifungal medicine, itching and discharge improved but burning on urination remained for the next 24 hours.  Today feeling much better.  Symptoms almost completely gone. Was initially concerned about UTI.  No other complaints or medical concerns today.  HPI   Prior to Admission medications   Medication Sig Start Date End Date Taking? Authorizing Provider  Ferrous Sulfate (FE-CAPS PO) Take by mouth.   Yes [provider]  levonorgestrel (LILETTA, 52 MG,) 19.5 MCG/DAY IUD IUD 1 each by Intrauterine route once.   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   Mild dysplasia of cervix 07/16/2013    Past Medical History:  Diagnosis Date   Abdominal cramping affecting pregnancy 08/24/2014   Abnormal Pap smear    Bleeding in early pregnancy 09/02/2014   Pelvic rest   Contraception management 06/16/2012   Contraceptive management 05/03/2015   Hemorrhoids 05/03/2015   History of abnormal cervical Pap smear 08/18/2015   Hx: UTI (urinary tract infection)    Miscarriage    Numbness in both legs 08/18/2015   Has numbness and tingling in both legs since epidural, on and off esp at night   Pregnant 01/28/2014   Vaginal irritation 12/25/2012   Vaginal Pap smear, abnormal    Yeast infection 12/25/2012    Past Surgical History:  Procedure Laterality Date   APPENDECTOMY     WISDOM TOOTH EXTRACTION      Social History   Socioeconomic History   Marital status: Married    Spouse name: Not on file   Number of children: Not on file   Years of  education: Not on file   Highest education level: Not on file  Occupational History   Not on file  Tobacco Use   Smoking status: Never   Smokeless tobacco: Never  Vaping Use   Vaping Use: Never used  Substance and Sexual Activity   Alcohol use: No    Comment: occ.   Drug use: No   Sexual activity: Yes    Birth control/protection: I.U.D.  Other Topics Concern   Not on file  Social History Narrative   Not on file   Social Determinants of Health   Financial Resource Strain: Low Risk  (01/22/2022)   Overall Financial Resource Strain (CARDIA)    Difficulty of Paying Living Expenses: Not hard at all  Food Insecurity: No Food Insecurity (01/22/2022)   Hunger Vital Sign    Worried About Running Out of Food in the Last Year: Never true    Ran Out of Food in the Last Year: Never true  Transportation Needs: No Transportation Needs (01/22/2022)   PRAPARE - Hydrologist (Medical): No    Lack of Transportation (Non-Medical): No  Physical Activity: Sufficiently Active (01/22/2022)   Exercise Vital Sign    Days of Exercise per Week: 5 days    Minutes of Exercise per Session: 40 min  Stress: No Stress Concern Present (01/22/2022)   [place]  Feeling of Stress : Only a little  Social Connections: Moderately Integrated (01/22/2022)   Social Connection and Isolation Panel [NHANES]    Frequency of Communication with Friends and Family: More than three times a week    Frequency of Social Gatherings with Friends and Family: More than three times a week    Attends Religious Services: 1 to 4 times per year    Active Member of [employer] or Organizations: No    Attends Archivist Meetings: Never    Marital Status: Married  Human resources officer Violence: Not At Risk (01/22/2022)   Humiliation, Afraid, Rape, and Kick questionnaire    Fear of Current or Ex-Partner: No    Emotionally Abused: No     Physically Abused: No    Sexually Abused: No    Family History  Problem Relation Age of Onset   Cancer Paternal Aunt        melenoma   Diabetes Maternal Grandmother    Hypertension Maternal Grandmother    Cancer Maternal Grandfather        Brain and Lung   Diabetes Maternal Grandfather    Cancer Paternal Grandmother        breast   Cancer Maternal Uncle      Review of Systems  Constitutional: Negative.  Negative for chills and fever.  HENT: Negative.  Negative for congestion and sore throat.   Respiratory: Negative.  Negative for cough and shortness of breath.   Cardiovascular: Negative.  Negative for chest pain and palpitations.  Gastrointestinal:  Negative for abdominal pain, diarrhea, nausea and vomiting.  Genitourinary:  Positive for dysuria (Improved). Negative for flank pain and hematuria.  Skin: Negative.  Negative for rash.  Neurological: Negative.  Negative for dizziness and headaches.  All other systems reviewed and are negative.  Today's Vitals   02/21/22 1547  BP: 122/72  Pulse: 67  Temp: 98 F (36.7 C)  TempSrc: Oral  SpO2: 99%  Weight: 149 lb 4 oz (67.7 kg)  Height: '5\' 3"'$  (1.6 m)   Body mass index is 26.44 kg/m.   Physical Exam Vitals reviewed.  Constitutional:      Appearance: Normal appearance.  HENT:     Head: Normocephalic.  Eyes:     Extraocular Movements: Extraocular movements intact.  Cardiovascular:     Rate and Rhythm: Normal rate.  Pulmonary:     Effort: Pulmonary effort is normal.  Skin:    General: Skin is warm and dry.  Neurological:     Mental Status: She is alert and oriented to person, place, and time.  Psychiatric:        Mood and Affect: Mood normal.        Behavior: Behavior normal.    Results for orders placed or performed in visit on 02/21/22 (from the past 24 hour(s))  POCT Urinalysis Dipstick     Status: Normal   Collection Time: 02/21/22  3:58 PM  Result Value Ref Range   Color, UA yellow    Clarity, UA clear     Glucose, UA Negative Negative   Bilirubin, UA negative    Ketones, UA negative    Spec Grav, UA 1.020 1.010 - 1.025   Blood, UA negative    pH, UA 6.0 5.0 - 8.0   Protein, UA Negative Negative   Urobilinogen, UA 0.2 0.2 or 1.0 E.U./dL   Nitrite, UA negative    Leukocytes, UA Negative Negative   Appearance     Odor  ASSESSMENT & PLAN: A total of 30 minutes was spent with the patient and counseling/coordination of care regarding preparing for this visit, review of most recent office visit notes, review of today's urinalysis, review of all medications, differential diagnosis of dysuria, need for urine culture, prognosis, documentation, and need for follow-up.  Problem List Items Addressed This Visit       Other   Dysuria - Primary    Yeast vaginitis successfully treated with antifungal. Dysuria most likely secondary to this. Symptoms much improved today and urinalysis is negative Doubt urinary tract infection.  Urine sent for culture today. Advised to stay well-hydrated and monitor symptoms Must contact the office if no better or worse during the next several days.      Relevant Orders   POCT Urinalysis Dipstick (Completed)   Urine Culture   Urinary frequency   Relevant Orders   POCT Urinalysis Dipstick (Completed)   Urine Culture   Patient Instructions  Dysuria Dysuria is pain or discomfort during urination. The pain or discomfort may be felt in the part of the body that drains urine from the bladder (urethra) or in the surrounding tissue of the genitals. The pain may also be felt in the groin area, lower abdomen, or lower back. You may have to urinate frequently or have the sudden feeling that you have to urinate (urgency). Dysuria can affect anyone, but it is more common in females. Dysuria can be caused by many different things, including: Urinary tract infection. Kidney stones or bladder stones. Certain STIs (sexually transmitted infections), such as  chlamydia. Dehydration. Inflammation of the tissues of the vagina. Use of certain medicines. Use of certain soaps or scented products that cause irritation. Follow these instructions at home: Medicines Take over-the-counter and prescription medicines only as told by your health care provider. If you were prescribed an antibiotic medicine, take it as told by your health care provider. Do not stop taking the antibiotic even if you start to feel better. Eating and drinking  Drink enough fluid to keep your urine pale yellow. Avoid caffeinated beverages, tea, and alcohol. These beverages can irritate the bladder and make dysuria worse. In males, alcohol may irritate the prostate. General instructions Watch your condition for any changes. Urinate often. Avoid holding urine for long periods of time. If you are female, you should wipe from front to back after urinating or having a bowel movement. Use each piece of toilet paper only once. Empty your bladder after sex. Keep all follow-up visits. This is important. If you had any tests done to find the cause of dysuria, it is up to you to get your test results. Ask your health care provider, or the department that is doing the test, when your results will be ready. Contact a health care provider if: You have a fever. You develop pain in your back or sides. You have nausea or vomiting. You have blood in your urine. You are not urinating as often as you usually do. Get help right away if: Your pain is severe and not relieved with medicines. You cannot eat or drink without vomiting. You are confused. You have a rapid heartbeat while resting. You have shaking or chills. You feel extremely weak. Summary Dysuria is pain or discomfort while urinating. Many different conditions can lead to dysuria. If you have dysuria, you may have to urinate frequently or have the sudden feeling that you have to urinate (urgency). Watch your condition for any  changes. Keep all follow-up visits. Make sure  that you urinate often and drink enough fluid to keep your urine pale yellow. This information is not intended to replace advice given to you by your health care provider. Make sure you discuss any questions you have with your health care provider. Document Revised: 10/23/2019 Document Reviewed: 10/23/2019 Elsevier Patient Education  [place], MD [facility]

## 2022-02-21 NOTE — Assessment & Plan Note (Signed)
Yeast vaginitis successfully treated with antifungal. Dysuria most likely secondary to this. Symptoms much improved today and urinalysis is negative Doubt urinary tract infection.  Urine sent for culture today. Advised to stay well-hydrated and monitor symptoms Must contact the office if no better or worse during the next several days.

## 2022-02-21 NOTE — Patient Instructions (Signed)

## 2022-02-22 LAB — URINE CULTURE: Result:: NO GROWTH

## 2022-03-01 ENCOUNTER — Ambulatory Visit (HOSPITAL_COMMUNITY)
Admission: RE | Admit: 2022-03-01 | Discharge: 2022-03-01 | Disposition: A | Discharge status: Home / Self Care | Payer: Commercial Managed Care - PPO | Source: Ambulatory Visit | Attending: Women's Health | Admitting: Women's Health

## 2022-03-01 DIAGNOSIS — Z1231 Encounter for screening mammogram for malignant neoplasm of breast: Secondary | ICD-10-CM | POA: Insufficient documentation

## 2022-03-02 ENCOUNTER — Other Ambulatory Visit (HOSPITAL_COMMUNITY): Payer: Self-pay | Admitting: Women's Health

## 2022-03-02 DIAGNOSIS — R928 Other abnormal and inconclusive findings on diagnostic imaging of breast: Secondary | ICD-10-CM

## 2022-03-07 ENCOUNTER — Encounter: Payer: Self-pay | Admitting: Emergency Medicine

## 2022-03-07 NOTE — Telephone Encounter (Signed)
Refer to orthopedist please.  Thanks.

## 2022-03-13 ENCOUNTER — Ambulatory Visit (INDEPENDENT_AMBULATORY_CARE_PROVIDER_SITE_OTHER): Payer: Commercial Managed Care - PPO | Admitting: Sports Medicine

## 2022-03-13 VITALS — BP 118/82 | Ht 64.0 in | Wt 153.0 lb

## 2022-03-13 DIAGNOSIS — G8929 Other chronic pain: Secondary | ICD-10-CM | POA: Diagnosis not present

## 2022-03-13 DIAGNOSIS — M25562 Pain in left knee: Secondary | ICD-10-CM | POA: Diagnosis not present

## 2022-03-13 NOTE — Progress Notes (Unsigned)
PCP: Horald Pollen, MD  Subjective:   HPI: Patient is a 41 y.o. female here for evaluation of left knee pain.  Patient reports that she had an injury at work in August.  States that she "turned a corner" and then suddenly felt a discomfort in her left medial knee.  She was  able to continue working.  She thought she may have strained a muscle and started to take Tylenol and ice her knee.  This seemed to help.  Ever since then she has had intermittent flares of similar pain.  This occurs about 1-2 times a month.  Her conservative treatments typically tend to relieve the discomfort.  Since last Tuesday (12/12), she has had another flare of discomfort, however, this does not seem to be relieved with ice or Tylenol.  Her discomfort is getting worse.  It has woken her up out of sleep.  She describes the discomfort as a "nagging pain" and pressure.  She has not noticed any swelling. No other trauma or injury. She has tried a compression sleeve which did not help. She tried heat which seemed to make the pain worse. She works out daily at [employer] and notes the pain is exacerbated by squatting past 90 degrees and when she sits on her knees to play with her kids. If she walks long distances she will also feel the discomfort and start to limp.   PMHx reviewed Medications reviewed Allergies reviewed   Objective:  Physical Exam: Blood pressure 118/82, height '5\' 4"'$  (1.626 m), weight 153 lb (69.4 kg). Gen: NAD, comfortable in exam room  Knee, Left: Inspection was negative for erythema, ecchymosis, and effusion. No obvious bony abnormalities or signs of osteophyte development. Palpation yielded no asymmetric warmth; No joint line tenderness; No condyle tenderness; No patellar tenderness; No patellar crepitus. Patellar and quadriceps tendons unremarkable, and no tenderness of the pes anserine bursa. No obvious Baker's cyst development. ROM normal in flexion (135 degrees) and extension (0 degrees).  Normal hamstring and quadriceps strength. Neurovascularly intact bilaterally. Normal gait.   - Ligaments: (Solid and consistent endpoints)   - ACL (present bilaterally)   - PCL (present bilaterally)   - LCL (present bilaterally)   - MCL (present bilaterally).   - Additional tests performed:    - Anterior Drawer >> NEG  - Meniscus:   - McMurray's: Discomfort with McMurray but no popping   Assessment & Plan:  1. Left Knee Pain Acute on chronic. Started after twisting injury in August.  Bedside U/S showed mild fluid surrounding medial meniscus. History and examination is concerning for meniscal injury. Also consider other ligamentous injury vs muscle strain (less likely). -X-ray -Home exercise program -NSAIDs for pain control PRN  -F/u in 6-weeks. Consider MRI if no improvement  Patient seen and evaluated with the resident.  I agree with the above plan of care.  Patient injured her knee several weeks ago and continues to have symptoms concerning for meniscal tear.  Ultrasound findings are as above.  I would like to get an x-ray of the knee and she will start with a home exercise program.  Follow-up with me again in 6 weeks for reevaluation.  If symptoms persist then consider MRI at that time.  This note was dictated using Dragon naturally speaking software and may contain errors in syntax, spelling, or content which have not been identified prior to signing this note.

## 2022-03-14 ENCOUNTER — Ambulatory Visit (HOSPITAL_COMMUNITY)
Admission: RE | Admit: 2022-03-14 | Discharge: 2022-03-14 | Disposition: A | Discharge status: Home / Self Care | Payer: Commercial Managed Care - PPO | Source: Ambulatory Visit | Attending: Women's Health | Admitting: Women's Health

## 2022-03-14 DIAGNOSIS — R928 Other abnormal and inconclusive findings on diagnostic imaging of breast: Secondary | ICD-10-CM

## 2022-03-15 ENCOUNTER — Ambulatory Visit
Admission: RE | Admit: 2022-03-15 | Discharge: 2022-03-15 | Disposition: A | Discharge status: Home / Self Care | Payer: Commercial Managed Care - PPO | Source: Ambulatory Visit | Attending: Sports Medicine | Admitting: Sports Medicine

## 2022-03-15 ENCOUNTER — Ambulatory Visit: Payer: Commercial Managed Care - PPO | Admitting: Emergency Medicine

## 2022-03-15 DIAGNOSIS — G8929 Other chronic pain: Secondary | ICD-10-CM

## 2022-03-27 ENCOUNTER — Other Ambulatory Visit (HOSPITAL_COMMUNITY): Payer: Commercial Managed Care - PPO

## 2022-03-27 ENCOUNTER — Encounter (HOSPITAL_COMMUNITY): Payer: Commercial Managed Care - PPO

## 2022-03-28 ENCOUNTER — Encounter: Payer: Self-pay | Admitting: Sports Medicine

## 2022-04-24 ENCOUNTER — Ambulatory Visit (INDEPENDENT_AMBULATORY_CARE_PROVIDER_SITE_OTHER): Payer: Commercial Managed Care - PPO | Admitting: Sports Medicine

## 2022-04-24 VITALS — BP 102/64 | Ht 64.0 in | Wt 145.0 lb

## 2022-04-24 DIAGNOSIS — G8929 Other chronic pain: Secondary | ICD-10-CM

## 2022-04-24 DIAGNOSIS — M25562 Pain in left knee: Secondary | ICD-10-CM | POA: Diagnosis not present

## 2022-04-24 NOTE — Progress Notes (Signed)
   Subjective:   HPI: Patient is a 42 y.o. female here for Left knee pain follow-up.  Patient had injury at work in August and was evaluated in December and thought to have meniscal injury. She was given a home exercise program and has had some improvement in her symptoms, she feels that on a scale of 1-10 she has had an improvement of about 3-4. She has not had any more acute flares and has not required NSAIDs or other medications at home for the pain. She still has difficulty with the knee when she is bending down with her knee braced on the floor (to play with her kids) and notices cracking/popping noises when she is bending down into deep squats when working out  PMH, medications, surgical history, social history, family history and allergies reviewed.     Objective:  Physical Exam: BP 102/64   Ht '5\' 4"'$  (1.626 m)   Wt 145 lb (65.8 kg)   BMI 24.89 kg/m  Gen: awake, alert, NAD, comfortable in exam room Pulm: breathing unlabored  Left Knee: - Inspection: no gross deformity. No swelling/effusion, erythema or bruising. Skin intact - Palpation: no TTP in the joint line. Crepitus palpated with passive flexion/extension of the knee - ROM: full active ROM with flexion and extension in knee and hip - Strength: 5/5 strength - Neuro/vasc: NV intact - Special Tests: - LIGAMENTS: negative anterior and posterior drawer, negative Lachman's, no MCL or LCL laxity  -- MENISCUS: negative McMurray's, negative Thessaly  -- PF JOINT: nml patellar mobility bilaterally.  negative patellar grind, negative patellar apprehension  Hips: normal ROM, negative FABER and FADIR bilaterally   DG Knee AP/LAT W/Sunrise Left Result Date: 03/15/2022 FINDINGS: No evidence of fracture, dislocation, or joint effusion. No evidence of arthropathy or other focal bone abnormality. Soft tissues are unremarkable. IMPRESSION: Negative.    Assessment & Plan:  1. Left knee pain Likely meniscal in etiology. Had discussion  with patient regarding the treatment options given that the x-ray was negative, she could consider an MRI but would only recommend going down that route if the pain was not improving and if she would decide to move forward with surgery if that was the true issue. Patient would like to hold off at this time and will keep doing the home exercises and see if she has more improvement vs doing the MRI when her work season slows down.  - Continue home exercise program - NSAIDs PRN - Follow-up PRN - If not improving, MRI to be considered    Alyona Romack, DO   Patient seen and evaluated with the resident.  I agree with the above plan of care.  After discussion, we decided to wait on MRI at this time.  Dore understands that if her symptoms become bothersome enough at a later date then we can reconsider that.  Follow-up as needed.

## 2022-05-14 ENCOUNTER — Ambulatory Visit (INDEPENDENT_AMBULATORY_CARE_PROVIDER_SITE_OTHER): Payer: Commercial Managed Care - PPO | Admitting: Emergency Medicine

## 2022-05-14 ENCOUNTER — Encounter: Payer: Self-pay | Admitting: Emergency Medicine

## 2022-05-14 VITALS — BP 116/72 | HR 77 | Temp 97.9°F | Ht 64.0 in | Wt 150.2 lb

## 2022-05-14 DIAGNOSIS — R519 Headache, unspecified: Secondary | ICD-10-CM | POA: Diagnosis not present

## 2022-05-14 MED ORDER — NURTEC 75 MG PO TBDP: 75.0000 mg | ORAL_TABLET | Freq: Every day | ORAL | 1 refills | Status: DC | PRN

## 2022-05-14 NOTE — Progress Notes (Signed)
Emily Chan 42 y.o.   Chief Complaint  Patient presents with   Acute Visit    Headaches x 1 week no history of migraines, OTC pain meds does not help. Pressure on the top of her head and behind her eyes  Patient states she is now nauseous from the headache    HISTORY OF PRESENT ILLNESS: Acute problem visit today. [name] complaining of intractable headache on and off for the past week.  Unknown trigger. Occasional nausea.  No vomiting.  No history of migraine headaches.  No new medications.  No new dietary changes. Over-the-counter analgesics not helping.  No other associated symptoms. No other complaints or medical concerns today.  HPI   Prior to Admission medications   Medication Sig Start Date End Date Taking? Authorizing Provider  Ferrous Sulfate (FE-CAPS PO) Take by mouth.   Yes [provider]  levonorgestrel (LILETTA, 52 MG,) 19.5 MCG/DAY IUD IUD 1 each by Intrauterine route once.   Yes [provider]  Rimegepant Sulfate (NURTEC) 75 MG TBDP Take 1 tablet (75 mg total) by mouth daily as needed. 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    Past Medical History:  Diagnosis Date   Abdominal cramping affecting pregnancy 08/24/2014   Abnormal Pap smear    Bleeding in early pregnancy 09/02/2014   Pelvic rest   Contraception management 06/16/2012   Contraceptive management 05/03/2015   Hemorrhoids 05/03/2015   History of abnormal cervical Pap smear 08/18/2015   Hx: UTI (urinary tract infection)    Miscarriage    Numbness in both legs 08/18/2015   Has numbness and tingling in both legs since epidural, on and off esp at night   Pregnant 01/28/2014   Vaginal irritation 12/25/2012   Vaginal Pap smear,  abnormal    Yeast infection 12/25/2012    Past Surgical History:  Procedure Laterality Date   APPENDECTOMY     WISDOM TOOTH EXTRACTION      Social History   Socioeconomic History   Marital status: Married    Spouse name: Not on file   Number of children: Not on file   Years of education: Not on file   Highest education level: Not on file  Occupational History   Not on file  Tobacco Use   Smoking status: Never   Smokeless tobacco: Never  Vaping Use   Vaping Use: Never used  Substance and Sexual Activity   Alcohol use: No    Comment: occ.   Drug use: No   Sexual activity: Yes    Birth control/protection: I.U.D.  Other Topics Concern   Not on file  Social History Narrative   Not on file   Social Determinants of Health   Financial Resource Strain: Low Risk  (01/22/2022)   Overall Financial Resource Strain (CARDIA)    Difficulty of Paying Living Expenses: Not hard at all  Food Insecurity: No Food Insecurity (01/22/2022)   Hunger Vital Sign    Worried About Running Out of Food in the Last Year: Never true    Ran Out of Food in the Last Year: Never true  Transportation Needs: No Transportation Needs (01/22/2022)   PRAPARE - Hydrologist (Medical):  No    Lack of Transportation (Non-Medical): No  Physical Activity: Sufficiently Active (01/22/2022)   Exercise Vital Sign    Days of Exercise per Week: 5 days    Minutes of Exercise per Session: 40 min  Stress: No Stress Concern Present (01/22/2022)   [place]    Feeling of Stress : Only a little  Social Connections: Moderately Integrated (01/22/2022)   Social Connection and Isolation Panel [NHANES]    Frequency of Communication with Friends and Family: More than three times a week    Frequency of Social Gatherings with Friends and Family: More than three times a week    Attends Religious Services: 1 to 4 times per year     Active Member of [employer] or Organizations: No    Attends Archivist Meetings: Never    Marital Status: Married  Human resources officer Violence: Not At Risk (01/22/2022)   Humiliation, Afraid, Rape, and Kick questionnaire    Fear of Current or Ex-Partner: No    Emotionally Abused: No    Physically Abused: No    Sexually Abused: No    Family History  Problem Relation Age of Onset   Cancer Paternal Aunt        melenoma   Diabetes Maternal Grandmother    Hypertension Maternal Grandmother    Cancer Maternal Grandfather        Brain and Lung   Diabetes Maternal Grandfather    Cancer Paternal Grandmother        breast   Cancer Maternal Uncle      Review of Systems  Constitutional: Negative.  Negative for chills and fever.  HENT: Negative.  Negative for congestion and sore throat.   Respiratory: Negative.  Negative for cough and shortness of breath.   Cardiovascular: Negative.  Negative for chest pain and palpitations.  Gastrointestinal:  Positive for nausea. Negative for abdominal pain, diarrhea and vomiting.  Genitourinary: Negative.  Negative for dysuria.  Musculoskeletal: Negative.   Skin: Negative.  Negative for rash.  Neurological:  Positive for headaches.  All other systems reviewed and are negative.  Today's Vitals   05/14/22 1539  BP: 116/72  Pulse: 77  Temp: 97.9 F (36.6 C)  TempSrc: Oral  SpO2: 98%  Weight: 150 lb 4 oz (68.2 kg)  Height: 5' 4"$  (1.626 m)   Body mass index is 25.79 kg/m.   Physical Exam Vitals reviewed.  Constitutional:      Appearance: Normal appearance.  HENT:     Head: Normocephalic.     Mouth/Throat:     Mouth: Mucous membranes are moist.     Pharynx: Oropharynx is clear.  Eyes:     Extraocular Movements: Extraocular movements intact.     Conjunctiva/sclera: Conjunctivae normal.     Pupils: Pupils are equal, round, and reactive to light.  Cardiovascular:     Rate and Rhythm: Normal rate and regular rhythm.     Pulses:  Normal pulses.     Heart sounds: Normal heart sounds.  Pulmonary:     Effort: Pulmonary effort is normal.     Breath sounds: Normal breath sounds.  Musculoskeletal:     Cervical back: No rigidity or tenderness.  Lymphadenopathy:     Cervical: No cervical adenopathy.  Skin:    General: Skin is warm and dry.  Neurological:     General: No focal deficit present.     Mental Status: She is alert and oriented to person, place, and  time.  Psychiatric:        Mood and Affect: Mood normal.        Behavior: Behavior normal.      ASSESSMENT & PLAN: A total of 32 minutes was spent with the patient and counseling/coordination of care regarding preparing for this visit, review of most recent office visit notes, review of all medications, differential diagnosis of headaches, headache management using Nurtec ODT, ED precautions, prognosis, documentation and need for follow-up if no better or worse during the next several days.  Problem List Items Addressed This Visit       Other   Acute intractable headache - Primary    Clinically stable.  No red flag signs or symptoms. Differential diagnosis discussed Acute headaches affecting quality of life and over-the-counter analgesics not helping. Will benefit from Nurtec ODT.  Samples given. ED precautions given. Advised to contact the office if no better or worse during the next several days.      Relevant Medications   Rimegepant Sulfate (NURTEC) 75 MG TBDP   Patient Instructions  General Headache Without Cause A headache is pain or discomfort you feel around the head or neck area. There are many causes and types of headaches. In some cases, the cause may not be found. Follow these instructions at home: Watch your condition for any changes. Let your doctor know about them. Take these steps to help with your condition: Managing pain     Take over-the-counter and prescription medicines only as told by your doctor. This includes medicines for  pain that are taken by mouth or put on the skin. Lie down in a dark, quiet room when you have a headache. If told, put ice on your head and neck area: Put ice in a plastic bag. Place a towel between your skin and the bag. Leave the ice on for 20 minutes, 2-3 times per day. Take off the ice if your skin turns bright red. This is very important. If you cannot feel pain, heat, or cold, you have a greater risk of damage to the area. If told, put heat on the affected area. Use the heat source that your doctor recommends, such as a moist heat pack or a heating pad. Place a towel between your skin and the heat source. Leave the heat on for 20-30 minutes. Take off the heat if your skin turns bright red. This is very important. If you cannot feel pain, heat, or cold, you have a greater risk of getting burned. Keep lights dim if bright lights bother you or make your headaches worse. Eating and drinking Eat meals on a regular schedule. If you drink alcohol: Limit how much you have to: 0-1 drink a day for women who are not pregnant. 0-2 drinks a day for men. Know how much alcohol is in a drink. In the [place] one drink equals one 12 oz bottle of beer (355 mL), one 5 oz glass of wine (148 mL), or one 1 oz glass of hard liquor (44 mL). Stop drinking caffeine, or drink less caffeine. General instructions  Keep a journal to find out if certain things bring on headaches. For example, write down: What you eat and drink. How much sleep you get. Any change to your diet or medicines. Get a massage or try other ways to relax. Limit stress. Sit up straight. Do not tighten (tense) your muscles. Do not smoke or use any products that contain nicotine or tobacco. If you need help quitting, ask your doctor.  Exercise regularly as told by your doctor. Get enough sleep. This often means 7-9 hours of sleep each night. Keep all follow-up visits. This is important. Contact a doctor if: Medicine does not help your  symptoms. You have a headache that feels different than the other headaches. You feel like you may vomit (nauseous) or you vomit. You have a fever. Get help right away if: Your headache: Gets very bad quickly. Gets worse after a lot of physical activity. You have any of these symptoms: You continue to vomit. A stiff neck. Trouble seeing. Your eye or ear hurts. Trouble speaking. Weak muscles or you lose muscle control. You lose your balance or have trouble walking. You feel like you will pass out (faint) or you pass out. You are mixed up (confused). You have a seizure. These symptoms may be an emergency. Get help right away. Call your local emergency services (911 in the [place] Do not wait to see if the symptoms will go away. Do not drive yourself to the hospital. Summary A headache is pain or discomfort that is felt around the head or neck area. There are many causes and types of headaches. In some cases, the cause may not be found. Keep a journal to help find out what causes your headaches. Watch your condition for any changes. Let your doctor know about them. Contact a doctor if you have a headache that is different from usual, or if medicine does not help your headache. Get help right away if your headache gets very bad, you throw up, you have trouble seeing, you lose your balance, or you have a seizure. This information is not intended to replace advice given to you by your health care provider. Make sure you discuss any questions you have with your health care provider. Document Revised: 08/10/2020 Document Reviewed: 08/10/2020 Elsevier Patient Education  [place], MD [facility]

## 2022-05-14 NOTE — Patient Instructions (Signed)
General Headache Without Cause A headache is pain or discomfort you feel around the head or neck area. There are many causes and types of headaches. In some cases, the cause may not be found. Follow these instructions at home: Watch your condition for any changes. Let your doctor know about them. Take these steps to help with your condition: Managing pain     Take over-the-counter and prescription medicines only as told by your doctor. This includes medicines for pain that are taken by mouth or put on the skin. Lie down in a dark, quiet room when you have a headache. If told, put ice on your head and neck area: Put ice in a plastic bag. Place a towel between your skin and the bag. Leave the ice on for 20 minutes, 2-3 times per day. Take off the ice if your skin turns bright red. This is very important. If you cannot feel pain, heat, or cold, you have a greater risk of damage to the area. If told, put heat on the affected area. Use the heat source that your doctor recommends, such as a moist heat pack or a heating pad. Place a towel between your skin and the heat source. Leave the heat on for 20-30 minutes. Take off the heat if your skin turns bright red. This is very important. If you cannot feel pain, heat, or cold, you have a greater risk of getting burned. Keep lights dim if bright lights bother you or make your headaches worse. Eating and drinking Eat meals on a regular schedule. If you drink alcohol: Limit how much you have to: 0-1 drink a day for women who are not pregnant. 0-2 drinks a day for men. Know how much alcohol is in a drink. In the [place] one drink equals one 12 oz bottle of beer (355 mL), one 5 oz glass of wine (148 mL), or one 1 oz glass of hard liquor (44 mL). Stop drinking caffeine, or drink less caffeine. General instructions  Keep a journal to find out if certain things bring on headaches. For example, write down: What you eat and drink. How much sleep you  get. Any change to your diet or medicines. Get a massage or try other ways to relax. Limit stress. Sit up straight. Do not tighten (tense) your muscles. Do not smoke or use any products that contain nicotine or tobacco. If you need help quitting, ask your doctor. Exercise regularly as told by your doctor. Get enough sleep. This often means 7-9 hours of sleep each night. Keep all follow-up visits. This is important. Contact a doctor if: Medicine does not help your symptoms. You have a headache that feels different than the other headaches. You feel like you may vomit (nauseous) or you vomit. You have a fever. Get help right away if: Your headache: Gets very bad quickly. Gets worse after a lot of physical activity. You have any of these symptoms: You continue to vomit. A stiff neck. Trouble seeing. Your eye or ear hurts. Trouble speaking. Weak muscles or you lose muscle control. You lose your balance or have trouble walking. You feel like you will pass out (faint) or you pass out. You are mixed up (confused). You have a seizure. These symptoms may be an emergency. Get help right away. Call your local emergency services (911 in the [place] Do not wait to see if the symptoms will go away. Do not drive yourself to the hospital. Summary A headache is pain or discomfort that  is felt around the head or neck area. There are many causes and types of headaches. In some cases, the cause may not be found. Keep a journal to help find out what causes your headaches. Watch your condition for any changes. Let your doctor know about them. Contact a doctor if you have a headache that is different from usual, or if medicine does not help your headache. Get help right away if your headache gets very bad, you throw up, you have trouble seeing, you lose your balance, or you have a seizure. This information is not intended to replace advice given to you by your health care provider. Make sure you  discuss any questions you have with your health care provider. Document Revised: 08/10/2020 Document Reviewed: 08/10/2020 Elsevier Patient Education  [place].

## 2022-05-14 NOTE — Assessment & Plan Note (Signed)
Clinically stable.  No red flag signs or symptoms. Differential diagnosis discussed Acute headaches affecting quality of life and over-the-counter analgesics not helping. Will benefit from Nurtec ODT.  Samples given. ED precautions given. Advised to contact the office if no better or worse during the next several days.

## 2022-05-15 ENCOUNTER — Encounter: Payer: Self-pay | Admitting: Emergency Medicine

## 2022-05-16 ENCOUNTER — Other Ambulatory Visit: Payer: Self-pay | Admitting: Emergency Medicine

## 2022-05-16 DIAGNOSIS — R519 Headache, unspecified: Secondary | ICD-10-CM

## 2022-05-16 MED ORDER — SUMATRIPTAN SUCCINATE 100 MG PO TABS
ORAL_TABLET | ORAL | 0 refills | Status: DC
Start: 2022-05-16 — End: 2022-09-06

## 2022-05-16 NOTE — Telephone Encounter (Signed)
New prescription for Imitrex sent to pharmacy of record today.  Thanks.

## 2022-05-17 ENCOUNTER — Ambulatory Visit: Payer: Commercial Managed Care - PPO | Admitting: Emergency Medicine

## 2022-05-22 ENCOUNTER — Encounter: Payer: Self-pay | Admitting: Emergency Medicine

## 2022-05-22 ENCOUNTER — Ambulatory Visit (INDEPENDENT_AMBULATORY_CARE_PROVIDER_SITE_OTHER): Payer: Commercial Managed Care - PPO | Admitting: Emergency Medicine

## 2022-05-22 VITALS — BP 110/70 | HR 80 | Temp 98.4°F | Ht 64.0 in | Wt 151.0 lb

## 2022-05-22 DIAGNOSIS — R519 Headache, unspecified: Secondary | ICD-10-CM

## 2022-05-22 LAB — CBC WITH DIFFERENTIAL/PLATELET
Basophils Absolute: 0 10*3/uL (ref 0.0–0.1)
Basophils Relative: 0.5 % (ref 0.0–3.0)
Eosinophils Absolute: 0.1 10*3/uL (ref 0.0–0.7)
Eosinophils Relative: 1.2 % (ref 0.0–5.0)
HCT: 41.4 % (ref 36.0–46.0)
Hemoglobin: 14.2 g/dL (ref 12.0–15.0)
Lymphocytes Relative: 24.9 % (ref 12.0–46.0)
Lymphs Abs: 1.9 10*3/uL (ref 0.7–4.0)
MCHC: 34.4 g/dL (ref 30.0–36.0)
MCV: 86.3 fl (ref 78.0–100.0)
Monocytes Absolute: 0.5 10*3/uL (ref 0.1–1.0)
Monocytes Relative: 7.2 % (ref 3.0–12.0)
Neutro Abs: 5 10*3/uL (ref 1.4–7.7)
Neutrophils Relative %: 66.2 % (ref 43.0–77.0)
Platelets: 185 10*3/uL (ref 150.0–400.0)
RBC: 4.79 Mil/uL (ref 3.87–5.11)
RDW: 13.6 % (ref 11.5–15.5)
WBC: 7.6 10*3/uL (ref 4.0–10.5)

## 2022-05-22 LAB — COMPREHENSIVE METABOLIC PANEL
ALT: 14 U/L (ref 0–35)
AST: 21 U/L (ref 0–37)
Albumin: 4.3 g/dL (ref 3.5–5.2)
Alkaline Phosphatase: 37 U/L — ABNORMAL LOW (ref 39–117)
BUN: 18 mg/dL (ref 6–23)
CO2: 27 mEq/L (ref 19–32)
Calcium: 9.8 mg/dL (ref 8.4–10.5)
Chloride: 106 mEq/L (ref 96–112)
Creatinine, Ser: 0.86 mg/dL (ref 0.40–1.20)
GFR: 84.61 mL/min (ref >60.00)
Glucose, Bld: 65 mg/dL — ABNORMAL LOW (ref 70–99)
Potassium: 4 mEq/L (ref 3.5–5.1)
Sodium: 140 mEq/L (ref 135–145)
Total Bilirubin: 0.4 mg/dL (ref 0.2–1.2)
Total Protein: 7.3 g/dL (ref 6.0–8.3)

## 2022-05-22 LAB — SEDIMENTATION RATE: Sed Rate: 4 mm/hr (ref 0–20)

## 2022-05-22 MED ORDER — TRAMADOL HCL 50 MG PO TABS: 50.0000 mg | ORAL_TABLET | Freq: Three times a day (TID) | ORAL | 0 refills | Status: AC | PRN

## 2022-05-22 NOTE — Patient Instructions (Signed)
Migraine Headache A migraine headache is a very strong throbbing pain on one or both sides of your head. This type of headache can also cause other symptoms. It can last from 4 hours to 3 days. Talk with your doctor about what things may bring on (trigger) this condition. What are the causes? The exact cause of a migraine is not known. This condition may be brought on or caused by: Smoking. Medicines, such as: Medicine used to treat chest pain (nitroglycerin). Birth control pills. Estrogen. Some blood pressure medicines. Certain substances in some foods or drinks. Foods and drinks, such as: Cheese. Chocolate. Alcohol. Caffeine. Doing physical activity that is very hard. Other things that may trigger a migraine headache include: Periods. Pregnancy. Hunger. Stress. Getting too much or too little sleep. Weather changes. Feeling tired (fatigue). What increases the risk? Being 44-86 years old. Being female. Having a family history of migraine headaches. Being Caucasian. Having a mental health condition, such as being sad (depressed) or feeling worried or nervous (anxious). Being very overweight (obese). What are the signs or symptoms? A throbbing pain. This pain may: Happen in any area of the head, such as on one or both sides. Make it hard to do daily activities. Get worse with physical activity. Get worse around bright lights, loud noises, or smells. Other symptoms may include: Feeling like you may vomit (nauseous). Vomiting. Dizziness. Before a migraine headache starts, you may get warning signs (an aura). An aura may include: Seeing flashing lights or having blind spots. Seeing bright spots, halos, or zigzag lines. Having tunnel vision or blurred vision. Having numbness or a tingling feeling. Having trouble talking. Having weak muscles. After a migraine ends, you may have symptoms. These may include: Tiredness. Trouble thinking (concentrating). How is this  treated? Taking medicines that: Relieve pain. Relieve the feeling like you may vomit. Prevent migraine headaches. Treatment may also include: Acupuncture. Lifestyle changes like avoiding foods that bring on migraine headaches. Learning ways to control your body functions (biofeedback). Therapy to help you know and deal with negative thoughts (cognitive behavioral therapy). Follow these instructions at home: Medicines Take over-the-counter and prescription medicines only as told by your doctor. If told, take steps to prevent problems with pooping (constipation). You may need to: Drink enough fluid to keep your pee (urine) pale yellow. Take medicines. You will be told what medicines to take. Eat foods that are high in fiber. These include beans, whole grains, and fresh fruits and vegetables. Limit foods that are high in fat and sugar. These include fried or sweet foods. Ask your doctor if you should avoid driving or using machines while you are taking your medicine. Lifestyle  Do not drink alcohol. Do not smoke or use any products that contain nicotine or tobacco. If you need help quitting, ask your doctor. Get 7-9 hours of sleep each night, or the amount recommended by your doctor. Find ways to deal with stress, such as meditation, deep breathing, or yoga. Try to exercise often. This can help lessen how bad and how often your migraines happen. General instructions Keep a journal to find out what may bring on your migraine headaches. This can help you avoid those things. For example, write down: What you eat and drink. How much sleep you get. Any change to your medicines or diet. If you have a migraine headache: Avoid things that make your symptoms worse, such as bright lights. Lie down in a dark, quiet room. Do not drive or use machinery. Ask your  doctor what activities are safe for you. Where to find more information Coalition for Headache and Migraine Patients (CHAMP):  headachemigraine.org American Migraine Foundation: americanmigrainefoundation.org National Headache Foundation: headaches.org Contact a doctor if: You get a migraine headache that is different or worse than others you have had. You have more than 15 days of headaches in one month. Get help right away if: Your migraine headache gets very bad. Your migraine headache lasts more than 72 hours. You have a fever or stiff neck. You have trouble seeing. Your muscles feel weak or like you cannot control them. You lose your balance a lot. You have trouble walking. You faint. You have a seizure. This information is not intended to replace advice given to you by your health care provider. Make sure you discuss any questions you have with your health care provider. Document Revised: 11/06/2021 Document Reviewed: 11/06/2021 Elsevier Patient Education  [place].

## 2022-05-22 NOTE — Progress Notes (Signed)
Emily Chan 42 y.o.   Chief Complaint  Patient presents with   Headache    Patient states the Imitrex was not working, she felt " weird" she stopped the taking the medication. The Nurtec didn't work as either.     HISTORY OF PRESENT ILLNESS: [name] A1A complaining of persistent headache for the past 2 weeks. Seen by me last week.  Tried Nurtec and Imitrex without good results.  Imitrex making her "loopy". Describes headache as throbbing, behind her eyes with nausea.  Occasionally sees little stars. No flulike symptoms, fever or chills.  No vomiting.  No photo or phono sensitivity.  No history of migraines. No new symptoms just persistent headaches. No more stress than usual. No recent head injuries.  HPI   Prior to Admission medications   Medication Sig Start Date End Date Taking? Authorizing Provider  levonorgestrel (LILETTA, 52 MG,) 19.5 MCG/DAY IUD IUD 1 each by Intrauterine route once.   Yes [provider]  Ferrous Sulfate (FE-CAPS PO) Take by mouth. Patient not taking: Reported on 05/22/2022    [provider]  Rimegepant Sulfate (NURTEC) 75 MG TBDP Take 1 tablet (75 mg total) by mouth daily as needed. Patient not taking: Reported on 05/22/2022 05/14/22   Horald Pollen, MD  SUMAtriptan [facility]) 100 MG tablet May repeat in 2 hours if headache persists or recurs. 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/2013    Past Medical History:  Diagnosis Date   Abdominal cramping affecting pregnancy 08/24/2014   Abnormal Pap smear    Bleeding in early pregnancy 09/02/2014   Pelvic rest   Contraception management 06/16/2012    Contraceptive management 05/03/2015   Hemorrhoids 05/03/2015   History of abnormal cervical Pap smear 08/18/2015   Hx: UTI (urinary tract infection)    Miscarriage    Numbness in both legs 08/18/2015   Has numbness and tingling in both legs since epidural, on and off esp at night   Pregnant 01/28/2014   Vaginal irritation 12/25/2012   Vaginal Pap smear, abnormal    Yeast infection 12/25/2012    Past Surgical History:  Procedure Laterality Date   APPENDECTOMY     WISDOM TOOTH EXTRACTION      Social History   Socioeconomic History   Marital status: Married    Spouse name: Not on file   Number of children: Not on file   Years of education: Not on file   Highest education level: Not on file  Occupational History   Not on file  Tobacco Use   Smoking status: Never   Smokeless tobacco: Never  Vaping Use   Vaping Use: Never used  Substance and Sexual Activity   Alcohol use: No    Comment: occ.   Drug use: No   Sexual activity: Yes    Birth control/protection: I.U.D.  Other Topics Concern   Not on file  Social History Narrative   Not on file   Social Determinants of Health   Financial Resource Strain: Low Risk  (01/22/2022)   Overall Financial Resource Strain (CARDIA)    Difficulty of Paying Living Expenses: Not hard at all  Food Insecurity: No Food Insecurity (01/22/2022)   Hunger Vital Sign    Worried About Running Out of Food in the Last Year: Never true    Ran Out of Food in the Last Year: Never true  Transportation Needs: No Transportation Needs (01/22/2022)   PRAPARE - Hydrologist (Medical): No    Lack of Transportation (Non-Medical): No  Physical Activity: Sufficiently Active (01/22/2022)   Exercise Vital Sign    Days of Exercise per Week: 5 days    Minutes of Exercise per Session: 40 min  Stress: No Stress Concern Present (01/22/2022)   [place]    Feeling of Stress :  Only a little  Social Connections: Moderately Integrated (01/22/2022)   Social Connection and Isolation Panel [NHANES]    Frequency of Communication with Friends and Family: More than three times a week    Frequency of Social Gatherings with Friends and Family: More than three times a week    Attends Religious Services: 1 to 4 times per year    Active Member of [employer] or Organizations: No    Attends Archivist Meetings: Never    Marital Status: Married  Human resources officer Violence: Not At Risk (01/22/2022)   Humiliation, Afraid, Rape, and Kick questionnaire    Fear of Current or Ex-Partner: No    Emotionally Abused: No    Physically Abused: No    Sexually Abused: No    Family History  Problem Relation Age of Onset   Cancer Paternal Aunt        melenoma   Diabetes Maternal Grandmother    Hypertension Maternal Grandmother    Cancer Maternal Grandfather        Brain and Lung   Diabetes Maternal Grandfather    Cancer Paternal Grandmother        breast   Cancer Maternal Uncle      Review of Systems  Constitutional: Negative.  Negative for chills and fever.  HENT: Negative.  Negative for congestion and sore throat.   Eyes: Negative.  Negative for blurred vision, double vision, photophobia and pain.  Respiratory: Negative.  Negative for cough and shortness of breath.   Cardiovascular: Negative.  Negative for chest pain and palpitations.  Gastrointestinal:  Negative for abdominal pain, diarrhea, nausea and vomiting.  Genitourinary: Negative.  Negative for dysuria and hematuria.  Skin: Negative.  Negative for rash.  Neurological:  Positive for headaches. Negative for dizziness, sensory change, speech change and focal weakness.  All other systems reviewed and are negative.  Today's Vitals   05/22/22 1442  BP: 110/70  Pulse: 80  Temp: 98.4 F (36.9 C)  TempSrc: Oral  SpO2: 96%  Weight: 151 lb (68.5 kg)  Height: '5\' 4"'$  (1.626 m)   Body mass index is 25.92  kg/m.   Physical Exam Vitals reviewed.  Constitutional:      Appearance: She is well-developed.  HENT:     Head: Normocephalic.     Right Ear: Tympanic membrane, ear canal and external ear normal.     Left Ear: Tympanic membrane, ear canal and external ear normal.     Mouth/Throat:     Mouth: Mucous membranes are moist.     Pharynx: Oropharynx is clear.  Eyes:     Extraocular Movements: Extraocular movements intact.     Pupils: Pupils are equal, round, and reactive to light.  Cardiovascular:     Rate and Rhythm: Normal rate and  regular rhythm.     Pulses: Normal pulses.     Heart sounds: Normal heart sounds.  Pulmonary:     Effort: Pulmonary effort is normal.     Breath sounds: Normal breath sounds.  Musculoskeletal:     Cervical back: No tenderness.  Lymphadenopathy:     Cervical: No cervical adenopathy.  Skin:    General: Skin is warm and dry.  Neurological:     General: No focal deficit present.     Mental Status: She is alert and oriented to person, place, and time.     Cranial Nerves: No cranial nerve deficit.     Sensory: No sensory deficit.     Motor: No weakness.     Coordination: Coordination normal.     Gait: Gait normal.  Psychiatric:        Mood and Affect: Mood normal.        Behavior: Behavior normal.      ASSESSMENT & PLAN: A total of 44 minutes was spent with the patient and counseling/coordination of care regarding preparing for this visit, review of most recent office visit notes, differential diagnosis of intractable headaches and ED precautions, pain management, review of all medications and new ones, need for neurology evaluation, prognosis, documentation and need for follow-up.  Problem List Items Addressed This Visit       Other   Acute intractable headache - Primary    Still active and affecting quality of life. Nurtec and Imitrex did not help. Did not like side effects of Imitrex. Clinically stable.  No red flag signs or  symptoms. Pain management discussed. Recommend tramadol for moderate to severe pain Recommend neurology evaluation Referral placed today. ED precautions given.      Relevant Medications   traMADol (ULTRAM) 50 MG tablet   Other Relevant Orders   Ambulatory referral to Neurology   CBC with Differential/Platelet   Comprehensive metabolic panel   Sedimentation rate   Patient Instructions  Migraine Headache A migraine headache is a very strong throbbing pain on one or both sides of your head. This type of headache can also cause other symptoms. It can last from 4 hours to 3 days. Talk with your doctor about what things may bring on (trigger) this condition. What are the causes? The exact cause of a migraine is not known. This condition may be brought on or caused by: Smoking. Medicines, such as: Medicine used to treat chest pain (nitroglycerin). Birth control pills. Estrogen. Some blood pressure medicines. Certain substances in some foods or drinks. Foods and drinks, such as: Cheese. Chocolate. Alcohol. Caffeine. Doing physical activity that is very hard. Other things that may trigger a migraine headache include: Periods. Pregnancy. Hunger. Stress. Getting too much or too little sleep. Weather changes. Feeling tired (fatigue). What increases the risk? Being 72-71 years old. Being female. Having a family history of migraine headaches. Being Caucasian. Having a mental health condition, such as being sad (depressed) or feeling worried or nervous (anxious). Being very overweight (obese). What are the signs or symptoms? A throbbing pain. This pain may: Happen in any area of the head, such as on one or both sides. Make it hard to do daily activities. Get worse with physical activity. Get worse around bright lights, loud noises, or smells. Other symptoms may include: Feeling like you may vomit (nauseous). Vomiting. Dizziness. Before a migraine headache starts, you may  get warning signs (an aura). An aura may include: Seeing flashing lights or having blind spots. Seeing bright  spots, halos, or zigzag lines. Having tunnel vision or blurred vision. Having numbness or a tingling feeling. Having trouble talking. Having weak muscles. After a migraine ends, you may have symptoms. These may include: Tiredness. Trouble thinking (concentrating). How is this treated? Taking medicines that: Relieve pain. Relieve the feeling like you may vomit. Prevent migraine headaches. Treatment may also include: Acupuncture. Lifestyle changes like avoiding foods that bring on migraine headaches. Learning ways to control your body functions (biofeedback). Therapy to help you know and deal with negative thoughts (cognitive behavioral therapy). Follow these instructions at home: Medicines Take over-the-counter and prescription medicines only as told by your doctor. If told, take steps to prevent problems with pooping (constipation). You may need to: Drink enough fluid to keep your pee (urine) pale yellow. Take medicines. You will be told what medicines to take. Eat foods that are high in fiber. These include beans, whole grains, and fresh fruits and vegetables. Limit foods that are high in fat and sugar. These include fried or sweet foods. Ask your doctor if you should avoid driving or using machines while you are taking your medicine. Lifestyle  Do not drink alcohol. Do not smoke or use any products that contain nicotine or tobacco. If you need help quitting, ask your doctor. Get 7-9 hours of sleep each night, or the amount recommended by your doctor. Find ways to deal with stress, such as meditation, deep breathing, or yoga. Try to exercise often. This can help lessen how bad and how often your migraines happen. General instructions Keep a journal to find out what may bring on your migraine headaches. This can help you avoid those things. For example, write down: What  you eat and drink. How much sleep you get. Any change to your medicines or diet. If you have a migraine headache: Avoid things that make your symptoms worse, such as bright lights. Lie down in a dark, quiet room. Do not drive or use machinery. Ask your doctor what activities are safe for you. Where to find more information Coalition for Headache and Migraine Patients (CHAMP): headachemigraine.org American Migraine Foundation: americanmigrainefoundation.org National Headache Foundation: headaches.org Contact a doctor if: You get a migraine headache that is different or worse than others you have had. You have more than 15 days of headaches in one month. Get help right away if: Your migraine headache gets very bad. Your migraine headache lasts more than 72 hours. You have a fever or stiff neck. You have trouble seeing. Your muscles feel weak or like you cannot control them. You lose your balance a lot. You have trouble walking. You faint. You have a seizure. This information is not intended to replace advice given to you by your health care provider. Make sure you discuss any questions you have with your health care provider. Document Revised: 11/06/2021 Document Reviewed: 11/06/2021 Elsevier Patient Education  [place], MD [facility]

## 2022-05-22 NOTE — Assessment & Plan Note (Signed)
Still active and affecting quality of life. Nurtec and Imitrex did not help. Did not like side effects of Imitrex. Clinically stable.  No red flag signs or symptoms. Pain management discussed. Recommend tramadol for moderate to severe pain Recommend neurology evaluation Referral placed today. ED precautions given.

## 2022-05-25 ENCOUNTER — Encounter: Payer: Self-pay | Admitting: Neurology

## 2022-05-28 NOTE — Progress Notes (Unsigned)
NEUROLOGY CONSULTATION NOTE  Emily Chan MRN: [id number] DOB: 03-11-1981  Referring provider: Horald Pollen, MD Primary care provider: Horald Pollen, MD  Reason for consult:  headache  Assessment/Plan:   New daily persistent headache  Check MRI of brain with and without contrast Start nortriptyline '10mg'$  at bedtime.  We can increase to '25mg'$  at bedtime in 4 weeks if needed. Advised to discontinue tramadol.  Would retry ibuprofen '800mg'$  or Excedrin Migraine with cyclobenzaprine as needed (caution for drowsiness) but limit use of pain relievers to no more than 2 days out of week to prevent rebound headache. Follow up 4 months.   Subjective:  Emily Chan is a 42 year old right-handed female who presents for headache.  History supplemented by referring provider's note.  On 2/10, she woke up with a severe headache and it has been daily ever since.  It is a pressure/squeezing pain on the anterior top of head, temples and behind the eyes.  Associated nausea.  On 3 occasions, she has noted floaters in her vision.  No associated vomiting, photophobia, or phonophobia.  It is not positional.  There is always a constant dull pressure but severity fluctuates.  May wake up with it or it may occur later in the day.  It lasts up to 8 hours.  Tramadol helps.  Nurtec, sumatriptan, ibuprofen and Excedrin Migraine were ineffective.  No head trauma, illness, change in medications, change in sleep hygiene.  Only past history of headaches was during her pregnancy.    Past NSAIDS/analgesics:  naproxen Past abortive triptans:  sumatriptan '100mg'$  Past abortive ergotamine:  none Past muscle relaxants:  tizanidine Past anti-emetic:  Zofran Past antihypertensive medications:  none Past antidepressant medications:  none Past anticonvulsant medications:  none Past anti-CGRP:  Nurtec  Past vitamins/Herbal/Supplements:  none Past antihistamines/decongestants:  Benadryl Other past therapies:   none  Current NSAIDS/analgesics:  tramadol Current triptans:  none Current ergotamine:  none Current anti-emetic:  none Current muscle relaxants:  none Current Antihypertensive medications:  none Current Antidepressant medications:  none Current Anticonvulsant medications:  none Current anti-CGRP:  none Current Vitamins/Herbal/Supplements:  none Current Antihistamines/Decongestants:  none Other therapy:  none Birth control:  Levonorgestrel   PAST MEDICAL HISTORY: Past Medical History:  Diagnosis Date   Abdominal cramping affecting pregnancy 08/24/2014   Abnormal Pap smear    Bleeding in early pregnancy 09/02/2014   Pelvic rest   Contraception management 06/16/2012   Contraceptive management 05/03/2015   Hemorrhoids 05/03/2015   History of abnormal cervical Pap smear 08/18/2015   Hx: UTI (urinary tract infection)    Miscarriage    Numbness in both legs 08/18/2015   Has numbness and tingling in both legs since epidural, on and off esp at night   Pregnant 01/28/2014   Vaginal irritation 12/25/2012   Vaginal Pap smear, abnormal    Yeast infection 12/25/2012    PAST SURGICAL HISTORY: Past Surgical History:  Procedure Laterality Date   APPENDECTOMY     WISDOM TOOTH EXTRACTION      MEDICATIONS: Current Outpatient Medications on File Prior to Visit  Medication Sig Dispense Refill   Ferrous Sulfate (FE-CAPS PO) Take by mouth. (Patient not taking: Reported on 05/22/2022)     levonorgestrel (LILETTA, 52 MG,) 19.5 MCG/DAY IUD IUD 1 each by Intrauterine route once.     Rimegepant Sulfate (NURTEC) 75 MG TBDP Take 1 tablet (75 mg total) by mouth daily as needed. (Patient not taking: Reported on 05/22/2022) 10 tablet 1   SUMAtriptan (IMITREX)  100 MG tablet May repeat in 2 hours if headache persists or recurs. Max dose '200mg'$  daily (Patient not taking: Reported on 05/22/2022) 10 tablet 0   No current facility-administered medications on file prior to visit.    ALLERGIES: Allergies  Allergen  Reactions   Sulfa Antibiotics Rash    FAMILY HISTORY: Family History  Problem Relation Age of Onset   Cancer Paternal Aunt        melenoma   Diabetes Maternal Grandmother    Hypertension Maternal Grandmother    Cancer Maternal Grandfather        Brain and Lung   Diabetes Maternal Grandfather    Cancer Paternal Grandmother        breast   Cancer Maternal Uncle     Objective:  Blood pressure 129/86, pulse 95, height '5\' 5"'$  (1.651 m), weight 148 lb 12.8 oz (67.5 kg), SpO2 100 %. General: No acute distress.  Patient appears well-groomed.   Head:  Normocephalic/atraumatic Eyes:  fundi examined but not visualized Neck: supple, no paraspinal tenderness, full range of motion Back: No paraspinal tenderness Heart: regular rate and rhythm Lungs: Clear to auscultation bilaterally. Vascular: No carotid bruits. Neurological Exam: Mental status: alert and oriented to person, place, and time, speech fluent and not dysarthric, language intact. Cranial nerves: CN I: not tested CN II: pupils equal, round and reactive to light, visual fields intact CN III, IV, VI:  full range of motion, no nystagmus, no ptosis CN V: facial sensation intact. CN VII: upper and lower face symmetric CN VIII: hearing intact CN IX, X: gag intact, uvula midline CN XI: sternocleidomastoid and trapezius muscles intact CN XII: tongue midline Bulk & Tone: normal, no fasciculations. Motor:  muscle strength 5/5 throughout Sensation:  Temperature and vibratory sensation intact. Deep Tendon Reflexes:  2+ throughout,  toes downgoing.   Finger to nose testing:  Without dysmetria.   Gait:  Normal station and stride.  Romberg negative.    Thank you for allowing me to take part in the care of this patient.  Metta Clines, DO  CC: Horald Pollen, MD

## 2022-05-29 ENCOUNTER — Ambulatory Visit (INDEPENDENT_AMBULATORY_CARE_PROVIDER_SITE_OTHER): Payer: Commercial Managed Care - PPO | Admitting: Neurology

## 2022-05-29 ENCOUNTER — Encounter: Payer: Self-pay | Admitting: Neurology

## 2022-05-29 VITALS — BP 129/86 | HR 95 | Ht 65.0 in | Wt 148.8 lb

## 2022-05-29 DIAGNOSIS — G4452 New daily persistent headache (NDPH): Secondary | ICD-10-CM

## 2022-05-29 MED ORDER — NORTRIPTYLINE HCL 10 MG PO CAPS: 10.0000 mg | ORAL_CAPSULE | Freq: Every day | ORAL | 5 refills | Status: DC

## 2022-05-29 MED ORDER — CYCLOBENZAPRINE HCL 10 MG PO TABS: 10.0000 mg | ORAL_TABLET | Freq: Three times a day (TID) | ORAL | 5 refills | Status: DC | PRN

## 2022-05-29 NOTE — Patient Instructions (Signed)
Will order MRI of brain with and without contrast Start nortriptyline '10mg'$  at bedtime.  If no improvement in headaches in 4 weeks then contact me and we can increase dose When the headaches get bad and you need to treat them, try to use the Excedrin migraine or ibuprofen '800mg'$ , but take with cyclobenzaprine (a muscle relaxer).  Caution as it may cause drowsiness.   Limit use of pain relievers to no more than 2 days out of week to prevent risk of rebound or medication-overuse headache. Follow up 4 months.

## 2022-06-10 ENCOUNTER — Encounter: Payer: Self-pay | Admitting: Women's Health

## 2022-06-11 ENCOUNTER — Encounter: Payer: Self-pay | Admitting: Neurology

## 2022-06-11 ENCOUNTER — Encounter: Payer: Self-pay | Admitting: Women's Health

## 2022-06-11 DIAGNOSIS — Z975 Presence of (intrauterine) contraceptive device: Secondary | ICD-10-CM | POA: Insufficient documentation

## 2022-06-15 ENCOUNTER — Ambulatory Visit: Payer: Commercial Managed Care - PPO | Admitting: Neurology

## 2022-06-25 ENCOUNTER — Ambulatory Visit
Admission: RE | Admit: 2022-06-25 | Discharge: 2022-06-25 | Disposition: A | Discharge status: Home / Self Care | Payer: Commercial Managed Care - PPO | Source: Ambulatory Visit | Attending: Neurology | Admitting: Neurology

## 2022-06-25 DIAGNOSIS — G4452 New daily persistent headache (NDPH): Secondary | ICD-10-CM

## 2022-06-25 MED ORDER — GADOPICLENOL 0.5 MMOL/ML IV SOLN
6.5000 mL | Freq: Once | INTRAVENOUS | Status: AC | PRN
  Administered 2022-06-25: 6.5 mL via INTRAVENOUS

## 2022-06-27 ENCOUNTER — Telehealth: Payer: Self-pay

## 2022-06-27 ENCOUNTER — Other Ambulatory Visit: Payer: Self-pay

## 2022-06-27 MED ORDER — NORTRIPTYLINE HCL 10 MG PO CAPS: ORAL_CAPSULE | ORAL | 5 refills | Status: DC

## 2022-06-27 NOTE — Telephone Encounter (Signed)
Per Patient her headaches are unbareable three days out if week she know she has to wait and see how the increase dose will help, but is there anything that she can do in the meantime? The ibuprofen 800mg  or Excedrin Migraine with cyclobenzaprine as needed not helping at all.  Please advise

## 2022-06-27 NOTE — Progress Notes (Signed)
Patient advised of MRI results.

## 2022-06-27 NOTE — Progress Notes (Signed)
LMOVM for patient to call the office back.

## 2022-06-28 ENCOUNTER — Other Ambulatory Visit: Payer: Self-pay

## 2022-06-28 DIAGNOSIS — G4452 New daily persistent headache (NDPH): Secondary | ICD-10-CM

## 2022-06-28 MED ORDER — RIZATRIPTAN BENZOATE 10 MG PO TBDP: 10.0000 mg | ORAL_TABLET | ORAL | 5 refills | Status: DC | PRN

## 2022-06-28 NOTE — Telephone Encounter (Signed)
Mychart message sent to patient.

## 2022-06-28 NOTE — Progress Notes (Signed)
Per Dr.Jaffe, We can send in prescription for rizatriptan 10mg  tablet - take as needed.  May repeat after 2 hours.  2 tablets in 24 hours.  This is an acute treatment (instead of over the counter pain relievers, if they are not working).  Try to limit to no more than 2 days out of week, but if she is having 3 days a week of severe headache, then may take no more than 3 days out of week.  However, she is usually only allotted a limited supply (9 or 10 tablets).

## 2022-07-10 ENCOUNTER — Other Ambulatory Visit: Payer: Self-pay

## 2022-07-10 ENCOUNTER — Emergency Department (HOSPITAL_COMMUNITY)
Admission: EM | Admit: 2022-07-10 | Discharge: 2022-07-10 | Disposition: A | Discharge status: Home / Self Care | Payer: Commercial Managed Care - PPO | Attending: Emergency Medicine | Admitting: Emergency Medicine

## 2022-07-10 DIAGNOSIS — Z23 Encounter for immunization: Secondary | ICD-10-CM | POA: Diagnosis not present

## 2022-07-10 DIAGNOSIS — T63001A Toxic effect of unspecified snake venom, accidental (unintentional), initial encounter: Secondary | ICD-10-CM | POA: Insufficient documentation

## 2022-07-10 DIAGNOSIS — W5911XA Bitten by nonvenomous snake, initial encounter: Secondary | ICD-10-CM

## 2022-07-10 MED ORDER — TETANUS-DIPHTH-ACELL PERTUSSIS 5-2.5-18.5 LF-MCG/0.5 IM SUSY
0.5000 mL | PREFILLED_SYRINGE | Freq: Once | INTRAMUSCULAR | Status: AC
  Administered 2022-07-10: 0.5 mL via INTRAMUSCULAR
  Filled 2022-07-10: qty 0.5

## 2022-07-10 NOTE — ED Triage Notes (Signed)
Pt presents with snake bite to the right wrist just prior to arrival. Pt states she does not know what kind of snake it was. Pt felt the bite and flung the snake and saw it slither away but wasn't sure what it looked like. Wrist has several scratch marks no bleeding noted. Felt a pinch and sting

## 2022-07-10 NOTE — Discharge Instructions (Signed)
Follow-up with your doctor if any problems.  Take Tylenol or Motrin for pain 

## 2022-07-10 NOTE — ED Provider Notes (Signed)
[name]GENCY DE[facility]MENT AT [facility] Provider Note   CSN: [id number] Arrival date & time: 07/10/22  1508     History  Chief Complaint  Patient presents with   Snake Bite    Emily Chan is a 42 y.o. female.  Patient was bitten on the right wrist by a snake.  Patient has a history of migraine headaches  The history is provided by the patient and medical records. No language interpreter was used.  Animal Bite Contact animal:  Snake Location:  Hand Hand injury location:  R wrist Pain details:    Quality:  Aching   Severity:  Mild   Timing:  Constant   Progression:  Partially resolved Incident location:  Home Provoked: unprovoked   Associated symptoms: no rash        Home Medications Prior to Admission medications   Medication Sig Start Date End Date Taking? Authorizing Provider  cyclobenzaprine (FLEXERIL) 10 MG tablet Take 1 tablet (10 mg total) by mouth 3 (three) times daily as needed for muscle spasms. 05/29/22   Drema Dallas, DO  Ferrous Sulfate (FE-CAPS PO) Take by mouth. Patient not taking: Reported on 05/22/2022    [provider]  levonorgestrel (LILETTA, 52 MG,) 19.5 MCG/DAY IUD IUD 1 each by Intrauterine route once.    [provider]  nortriptyline (PAMELOR) 10 MG capsule Take one capsule  20 mg at bedtime 06/27/22   Drema Dallas, DO  Rimegepant Sulfate (NURTEC) 75 MG TBDP Take 1 tablet (75 mg total) by mouth daily as needed. Patient not taking: Reported on 05/29/2022 05/14/22   Georgina Quint, MD  rizatriptan (MAXALT-MLT) 10 MG disintegrating tablet Take 1 tablet (10 mg total) by mouth as needed for migraine. May repeat in 2 hours if needed 06/28/22   Drema Dallas, DO  SUMAtriptan (IMITREX) 100 MG tablet May repeat in 2 hours if headache persists or recurs. Max dose 200mg  daily Patient not taking: Reported on 05/29/2022 05/16/22   Georgina Quint, MD      Allergies    Sulfa antibiotics    Review of Systems    Review of Systems  Constitutional:  Negative for appetite change and fatigue.  HENT:  Negative for congestion, ear discharge and sinus pressure.   Eyes:  Negative for discharge.  Respiratory:  Negative for cough.   Cardiovascular:  Negative for chest pain.  Gastrointestinal:  Negative for abdominal pain and diarrhea.  Genitourinary:  Negative for frequency and hematuria.  Musculoskeletal:  Negative for back pain.       Wrist pain  Skin:  Negative for rash.  Neurological:  Negative for seizures and headaches.  Psychiatric/Behavioral:  Negative for hallucinations.     Physical Exam Updated Vital Signs BP 136/89 (BP Location: Left Arm)   Pulse 92   Temp 98.3 F (36.8 C) (Oral)   Resp 16   Wt 67.5 kg   SpO2 100%   BMI 24.76 kg/m  Physical Exam Vitals and nursing note reviewed.  Constitutional:      Appearance: She is well-developed.  HENT:     Head: Normocephalic.     Nose: Nose normal.  Eyes:     General: No scleral icterus.    Conjunctiva/sclera: Conjunctivae normal.  Neck:     Thyroid: No thyromegaly.  Cardiovascular:     Rate and Rhythm: Normal rate and regular rhythm.     Heart sounds: No murmur heard.    No friction rub. No gallop.  Pulmonary:  Breath sounds: No stridor. No wheezing or rales.  Chest:     Chest wall: No tenderness.  Abdominal:     General: There is no distension.     Tenderness: There is no abdominal tenderness. There is no rebound.  Musculoskeletal:     Cervical back: Neck supple.     Comments: Bruising and bite marks to right hand  Lymphadenopathy:     Cervical: No cervical adenopathy.  Skin:    Findings: No erythema or rash.  Neurological:     Mental Status: She is alert and oriented to person, place, and time.     Motor: No abnormal muscle tone.     Coordination: Coordination normal.  Psychiatric:        Behavior: Behavior normal.     ED Results / Procedures / Treatments   Labs (all labs ordered are listed, but only  abnormal results are displayed) Labs Reviewed - No data to display  EKG None  Radiology No results found.  Procedures Procedures    Medications Ordered in ED Medications  Tdap (BOOSTRIX) injection 0.5 mL (0.5 mLs Intramuscular Given 07/10/22 1721)    ED Course/ Medical Decision Making/ A&P                             Medical Decision Making Risk Prescription drug management.   Patient with snakebite without significant injury.  She will follow-up with her PCP        Final Clinical Impression(s) / ED Diagnoses Final diagnoses:  Snake bite, initial encounter    Rx / DC Orders ED Discharge Orders     None         Bethann Berkshire, MD 07/11/22 1049

## 2022-07-11 ENCOUNTER — Telehealth: Payer: Self-pay

## 2022-07-11 NOTE — Transitions of Care (Post Inpatient/ED Visit) (Signed)
   07/11/2022  Name: Emily Chan MRN: [id number] DOB: May 11, 1980  Today's TOC FU Call Status: Today's TOC FU Call Status:: Unsuccessul Call (1st Attempt) Unsuccessful Call (1st Attempt) Date: 07/11/22  Attempted to reach the patient regarding the most recent Inpatient/ED visit.  Follow Up Plan: Additional outreach attempts will be made to reach the patient to complete the Transitions of Care (Post Inpatient/ED visit) call.   Signature Karena Addison, LPN [facility] Nurse Health Advisor Direct Dial [phone]

## 2022-07-15 IMAGING — MG DIGITAL DIAGNOSTIC BILAT W/ TOMO W/ CAD
6 of 10 series · 6 of 30 positions shown · non-contrast
Comparison: None.

CLINICAL DATA: 40-year-old female with a palpable area of concern
in the left breast.

EXAM:
DIGITAL DIAGNOSTIC BILATERAL MAMMOGRAM WITH TOMOSYNTHESIS AND CAD;
ULTRASOUND LEFT BREAST LIMITED
TECHNIQUE: Bilateral digital diagnostic mammography and breast tomosynthesis
was performed. The images were evaluated with computer-aided
detection.; Targeted ultrasound examination of the left breast was
performed.

[L CC synth-2D]
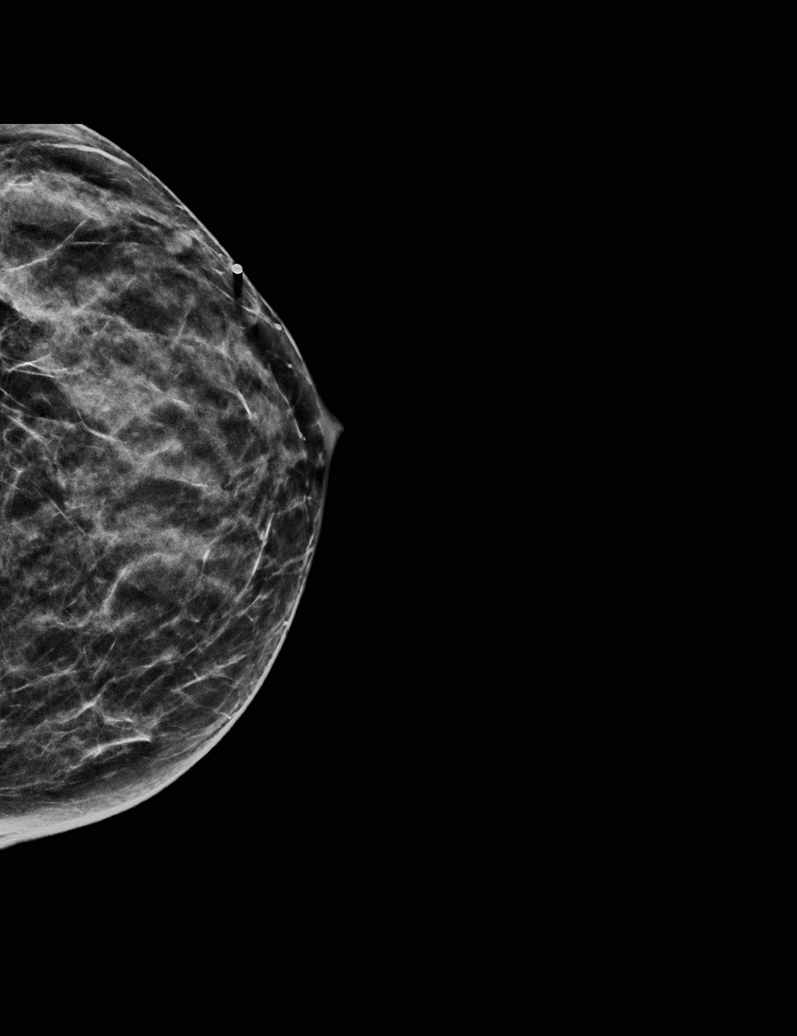

[L TAN synth-2D]
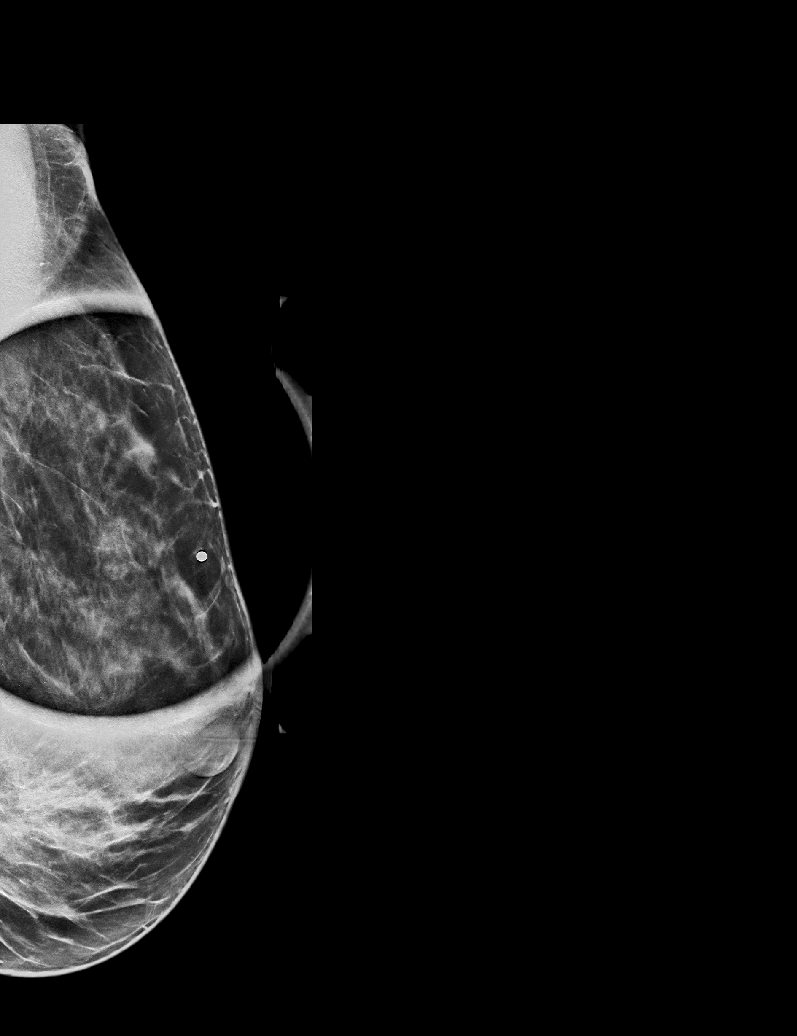

[L MLO synth-2D]
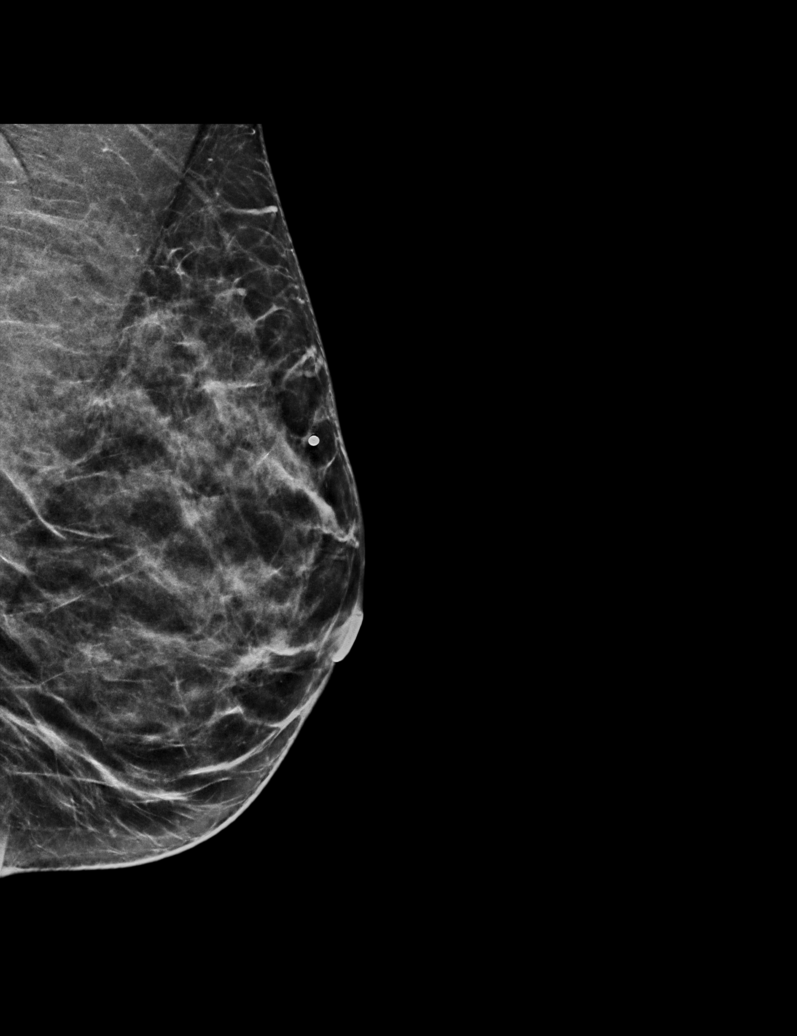

[R MLO synth-2D]
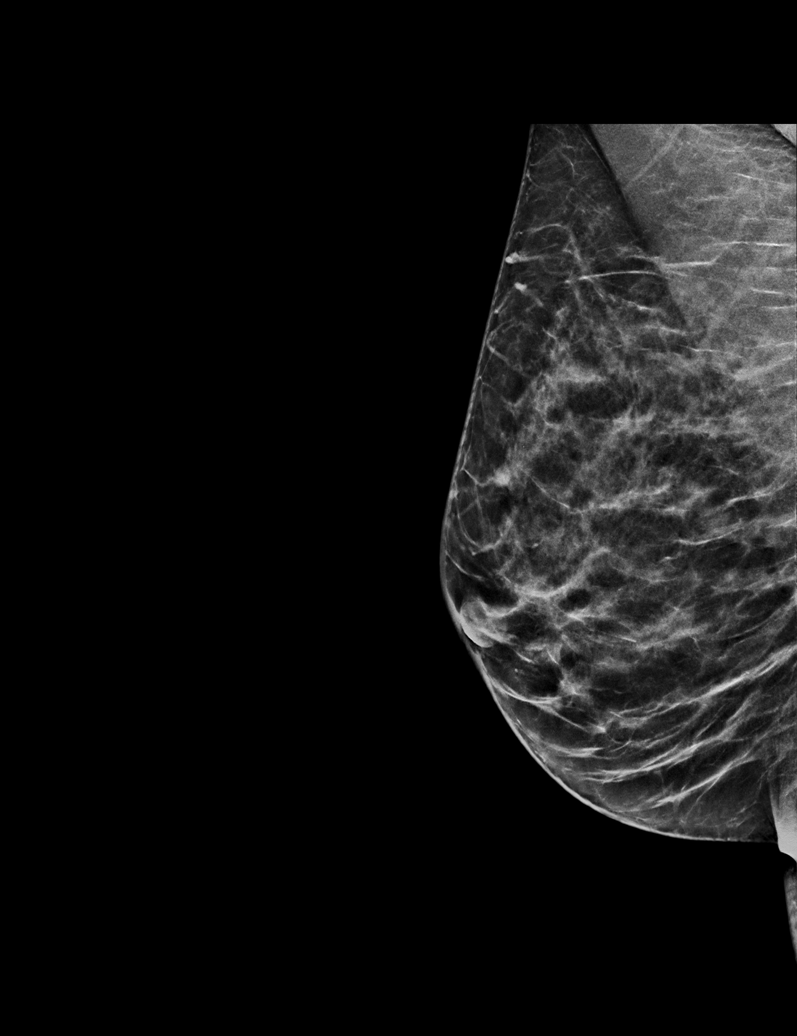

[R CC synth-2D]
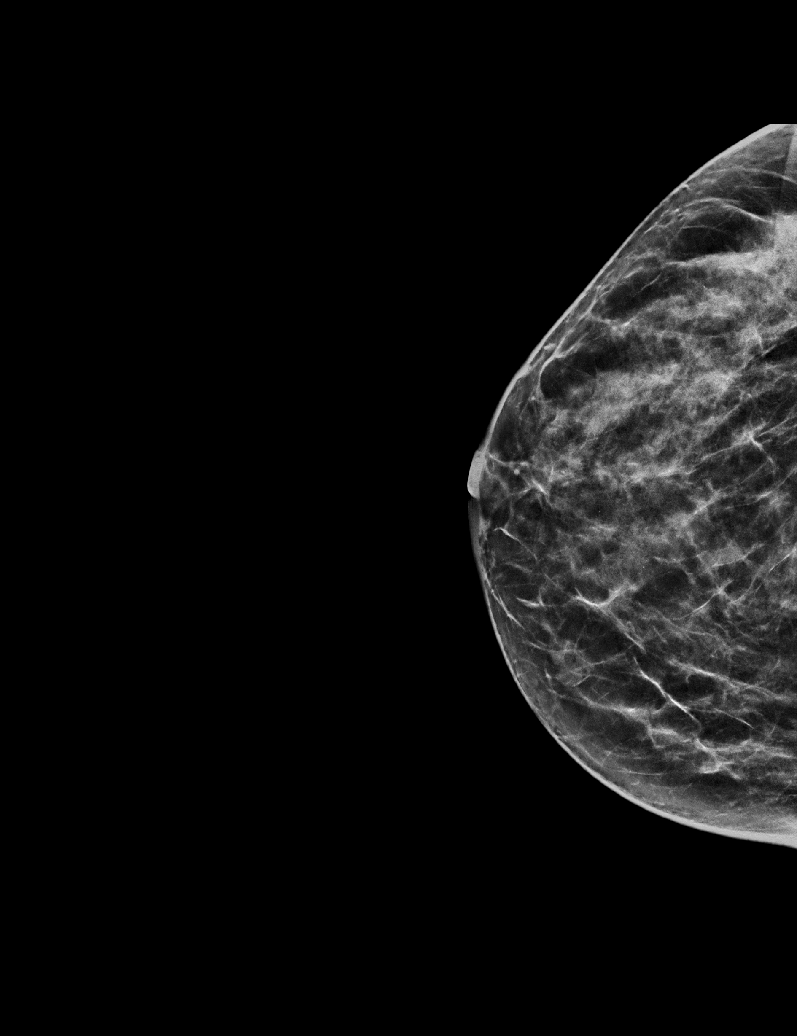

[L TAN tomo · tomo slice 28/55.0]
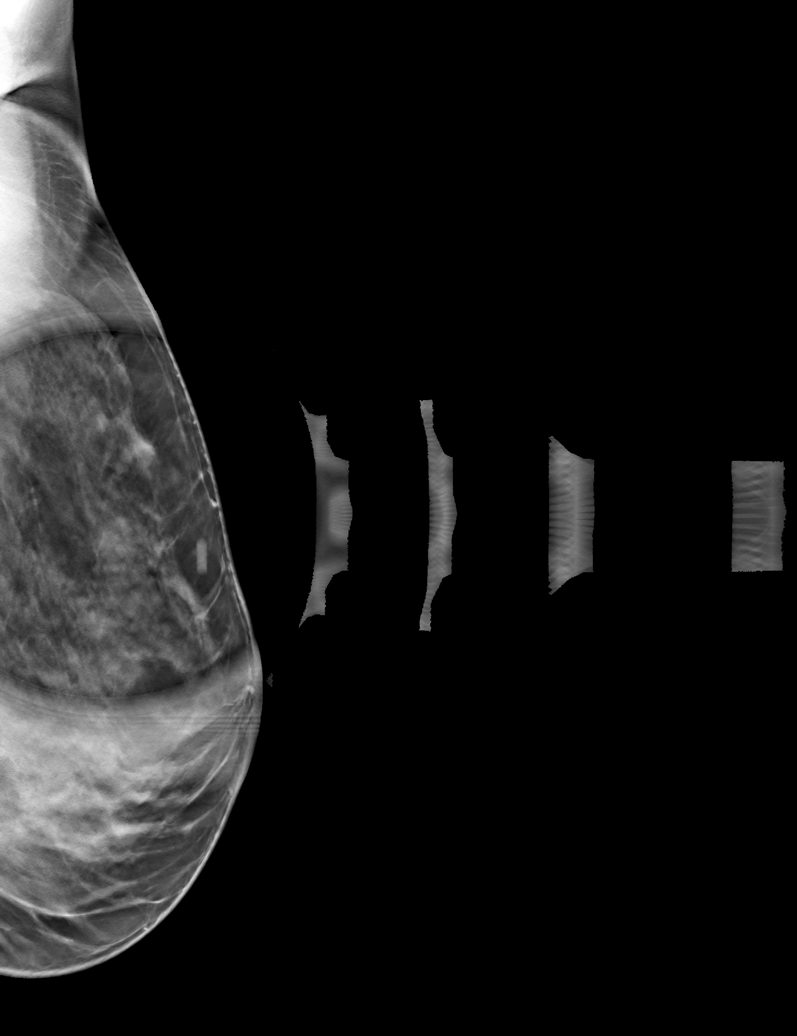

[6 of 30 positions shown; findings below may reference images not displayed]

ACR Breast Density Category c: The breast tissue is heterogeneously
dense, which may obscure small masses.
FINDINGS: No suspicious masses or calcifications seen in the right breast.
Spot compression tomograms were performed over the palpable area of
concern in the left breast demonstrating at least 1 oval mass with
obscured margins measuring approximately 0.7 cm.

Physical examination in the region of palpable concern in the
upper-outer left breast reveals generalized lumpiness/thickening.

Targeted ultrasound of the left breast was performed. There is a
cluster of cysts at 1 o'clock 2 cm from nipple measuring 1.1 x 0.5 x
1.1 cm. There is a hypoechoic mass with margin irregularity in the
left breast at 2 o'clock 2 cm from nipple measuring 0.5 x 0.5 x
cm, possibly a complicated cyst however cannot be clearly
characterized as such. These masses are felt to correspond with
mammography findings. No lymphadenopathy seen in the left axilla.
IMPRESSION: Indeterminate 0.7 cm mass in the left breast at 2 o'clock 2 cm from
nipple.

RECOMMENDATION:
Recommend attempt at ultrasound-guided aspiration of the mass in the
left breast at the 2 o'clock position. If this mass does not easily
aspirate, then recommend ultrasound-guided core biopsy.

I have discussed the findings and recommendations with the patient.
If applicable, a reminder letter will be sent to the patient
regarding the next appointment.

BI-RADS CATEGORY  4: Suspicious.

## 2022-07-17 IMAGING — US US BREAST BX W LOC DEV 1ST LESION IMG BX SPEC US GUIDE*L*
1 series · 12 of 17 positions shown · non-contrast
Comparison: Previous exam(s).
COMPARISON: Previous exam(s).

Addendum:
CLINICAL DATA: 40-year-old female for aspiration/possible biopsy of
UPPER-OUTER LEFT breast mass.

EXAM:
ULTRASOUND GUIDED LEFT BREAST CORE NEEDLE BIOPSY

[Series 1: us breast bx w loc dev 1st lesion img bx spec us g · 0.07mm/px · 12 of 17 slices shown]
[im 1/17]
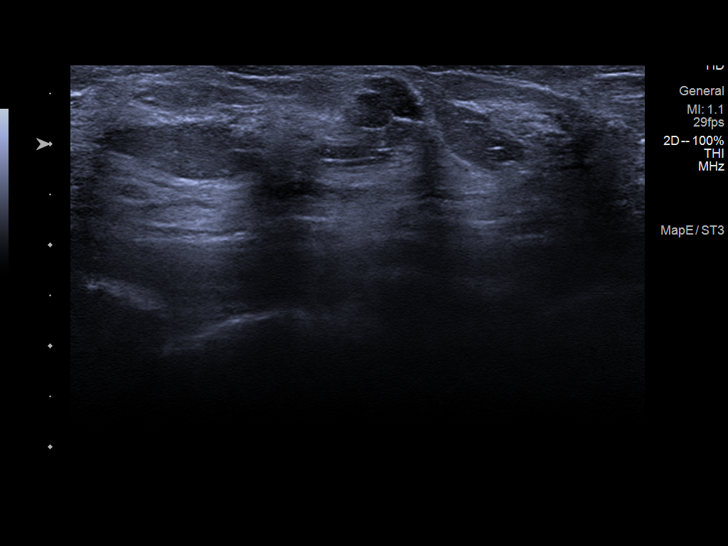
[im 3/17]
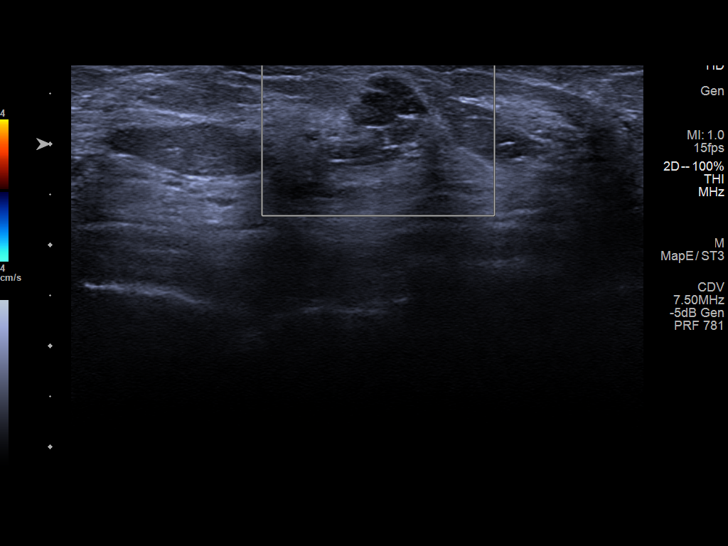
[im 4/17]
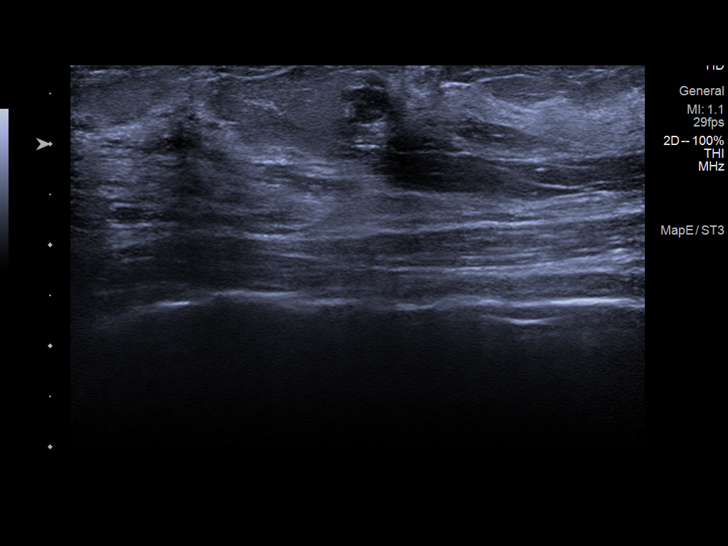
[im 5/17]
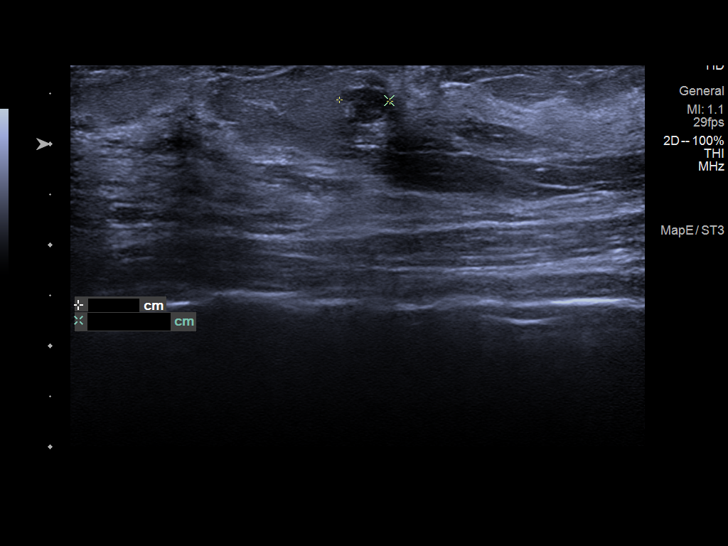
[im 7/17]
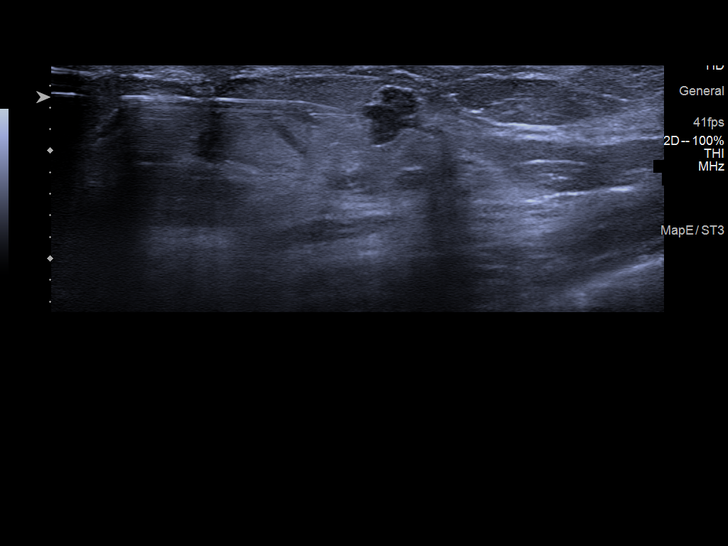
[im 8/17]
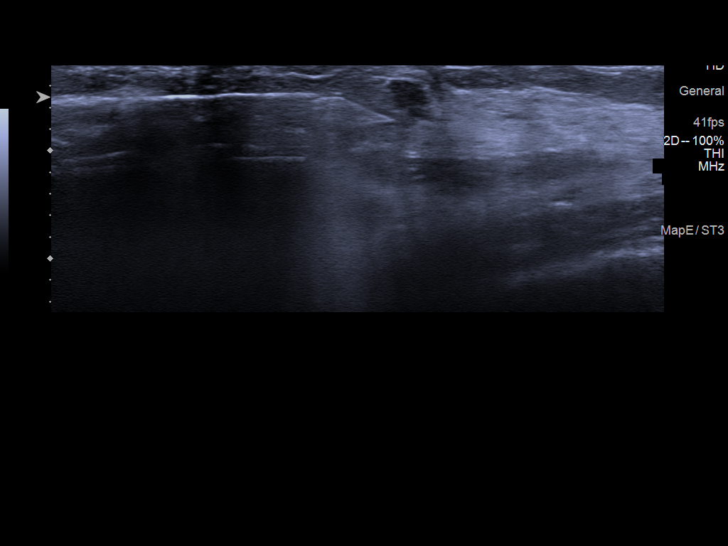
[im 10/17]
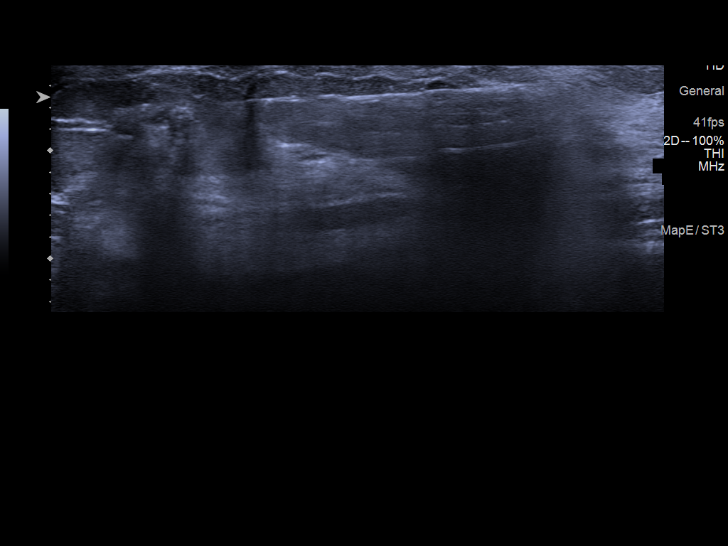
[im 11/17]
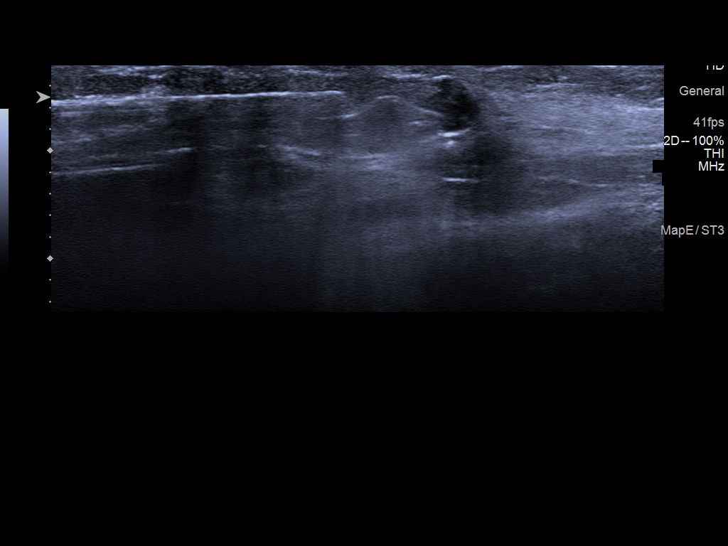
[im 13/17]
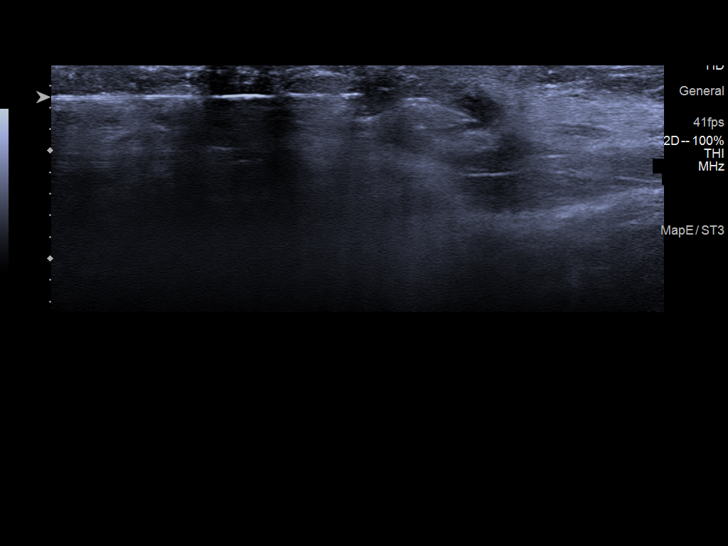
[im 14/17]
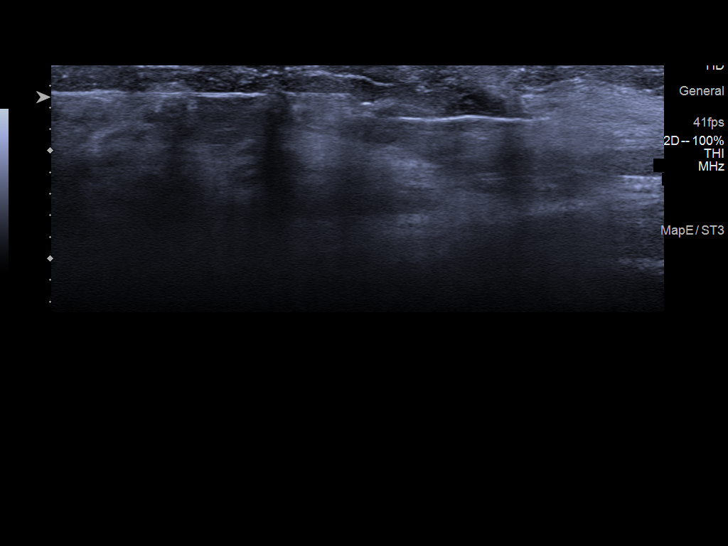
[im 15/17]
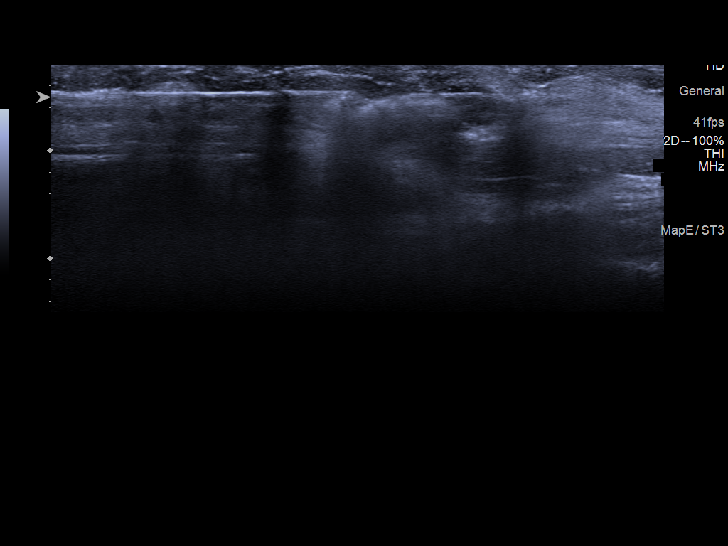
[im 17/17]
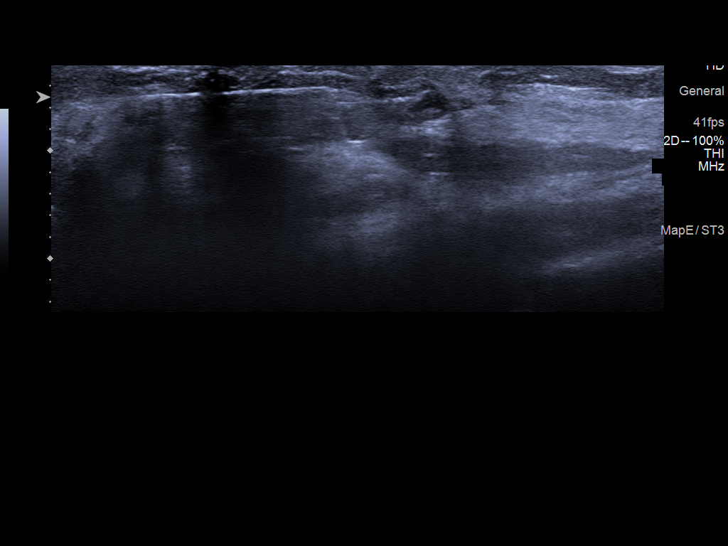

[12 of 17 positions shown; findings below may reference images not displayed]



Lesion quadrant: UPPER-OUTER LEFT breast

Using sterile technique and 1% Lidocaine with and without
epinephrine as local anesthetic, under direct ultrasound
visualization, attempt to aspirate the 0.7 cm LEFT breast mass at
the 2 o'clock position was unsuccessful to yield fluid.

Subsequently, a 12 gauge Zoe device was used to perform
biopsy of the 0.7 cm mass at the 2 o'clock position of the LEFT
breast 2 cm from the nipple using a MEDIAL approach. At the
conclusion of the procedure a RIBBON tissue marker clip was deployed
into the biopsy cavity. Follow up 2 view mammogram was performed and
dictated separately.
IMPRESSION: Ultrasound guided biopsy of 0.7 cm UPPER-OUTER LEFT breast mass. No
apparent complications.

ADDENDUM:
Pathology revealed FIBROADENOMA of the LEFT breast, 2 o'clock
(ribbon clip). This was found to be concordant by Dr. Hendrik Gardea.

Pathology results were discussed with the patient by telephone. The
patient reported doing well after the biopsy with tenderness at the
site. Post biopsy instructions and care were reviewed and questions
were answered. The patient was encouraged to call [HOSPITAL] -
[HOSPITAL] Ultrasound/ Mammography for any additional concerns.

The patient was instructed to continue with monthly self breast
examinations, clinical follow-up as needed, and to return for annual
mammography at 41. The patient was informed a reminder notice would
be sent regarding this appointment.

Pathology results reported by Alvine Flore Mejane RN on 03/03/2021.



Lesion quadrant: UPPER-OUTER LEFT breast

Using sterile technique and 1% Lidocaine with and without
epinephrine as local anesthetic, under direct ultrasound
visualization, attempt to aspirate the 0.7 cm LEFT breast mass at
the 2 o'clock position was unsuccessful to yield fluid.

Subsequently, a 12 gauge Zoe device was used to perform
biopsy of the 0.7 cm mass at the 2 o'clock position of the LEFT
breast 2 cm from the nipple using a MEDIAL approach. At the
conclusion of the procedure a RIBBON tissue marker clip was deployed
into the biopsy cavity. Follow up 2 view mammogram was performed and
dictated separately.
IMPRESSION: Ultrasound guided biopsy of 0.7 cm UPPER-OUTER LEFT breast mass. No
apparent complications.

## 2022-07-17 NOTE — Transitions of Care (Post Inpatient/ED Visit) (Signed)
   07/17/2022  Name: Emily Chan MRN: [id number] DOB: 01-Jan-1981  Today's TOC FU Call Status: Today's TOC FU Call Status:: Unsuccessul Call (1st Attempt) Unsuccessful Call (1st Attempt) Date: 07/11/22  Attempted to reach the patient regarding the most recent Inpatient/ED visit.  Follow Up Plan: No further outreach attempts will be made at this time. We have been unable to contact the patient.  Signature Karena Addison, LPN [facility] Nurse Health Advisor Direct Dial [phone]

## 2022-09-04 NOTE — Progress Notes (Signed)
NEUROLOGY FOLLOW UP OFFICE NOTE  Emily Chan [id number]  Assessment/Plan:   New daily persistent headache   Check MRI of brain with and without contrast Start nortriptyline 10mg  at bedtime.  We can increase to 25mg  at bedtime in 4 weeks if needed. Advised to discontinue tramadol.  Would retry ibuprofen 800mg  or Excedrin Migraine with cyclobenzaprine as needed (caution for drowsiness) but limit use of pain relievers to no more than 2 days out of week to prevent rebound headache. Follow up 4 months.     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    Started nortriptyline. Intensity:  *** Duration:  *** Frequency:  *** Current NSAIDS/analgesics:  ibuprofen 800mg  Current triptans:  rizatriptan 10mg  Current ergotamine:  none Current anti-emetic:  none Current muscle relaxants:  cyclobenzaprine 10mg  Current Antihypertensive medications:  none Current Antidepressant medications:  nortriptyline 20mg  at bedtime *** Current Anticonvulsant medications:  none Current anti-CGRP:  none Current Vitamins/Herbal/Supplements:  none Current Antihistamines/Decongestants:  none Other therapy:  none Birth control:  Levonorgestrel  HISTORY:  On 2/10, she woke up with a severe headache and it has been daily ever since.  It is a pressure/squeezing pain on the anterior top of head, temples and behind the eyes.  Associated nausea.  On 3 occasions, she has noted floaters in her vision.  No associated vomiting, photophobia, or phonophobia.  It is not positional.  There is always a constant dull pressure but severity fluctuates.  May wake up with it or it may occur later in the day.  It lasts up to 8 hours.  Tramadol helps.  Nurtec, sumatriptan, ibuprofen and Excedrin Migraine  were ineffective.   No head trauma, illness, change in medications, change in sleep hygiene.  Only past history of headaches was during her pregnancy.     Past NSAIDS/analgesics:  naproxen, tramadol Past abortive triptans:  sumatriptan 100mg  Past abortive ergotamine:  none Past muscle relaxants:  tizanidine Past anti-emetic:  Zofran Past antihypertensive medications:  none Past antidepressant medications:  none Past anticonvulsant medications:  none Past anti-CGRP:  Nurtec  Past vitamins/Herbal/Supplements:  none Past antihistamines/decongestants:  Benadryl Other past therapies:  none     PAST MEDICAL HISTORY: Past Medical History:  Diagnosis Date   Abdominal cramping affecting pregnancy 08/24/2014   Abnormal Pap smear    Bleeding in early pregnancy 09/02/2014   Pelvic rest   Contraception management 06/16/2012   Contraceptive management 05/03/2015   Hemorrhoids 05/03/2015   History of abnormal cervical Pap smear 08/18/2015   Hx: UTI (urinary tract infection)    Miscarriage    Numbness in both legs 08/18/2015   Has numbness and tingling in both legs since epidural, on and off esp at night   Pregnant 01/28/2014   Vaginal irritation 12/25/2012   Vaginal Pap smear, abnormal    Yeast infection 12/25/2012    MEDICATIONS: Current Outpatient Medications on File Prior to Visit  Medication Sig Dispense Refill   cyclobenzaprine (FLEXERIL) 10 MG tablet Take 1 tablet (10 mg total) by mouth 3 (three) times daily as needed for muscle spasms. 90 tablet 5   Ferrous Sulfate (FE-CAPS PO) Take by mouth. (Patient not taking: Reported on 05/22/2022)     levonorgestrel (LILETTA, 52 MG,) 19.5 MCG/DAY IUD IUD 1 each by Intrauterine route once.  nortriptyline (PAMELOR) 10 MG capsule Take one capsule  20 mg at bedtime 30 capsule 5   Rimegepant Sulfate (NURTEC) 75 MG TBDP Take 1 tablet (75 mg total) by mouth daily as needed. (Patient not taking: Reported on 05/29/2022) 10 tablet 1   rizatriptan (MAXALT-MLT)  10 MG disintegrating tablet Take 1 tablet (10 mg total) by mouth as needed for migraine. May repeat in 2 hours if needed 9 tablet 5   SUMAtriptan (IMITREX) 100 MG tablet May repeat in 2 hours if headache persists or recurs. Max dose 200mg  daily (Patient not taking: Reported on 05/29/2022) 10 tablet 0   No current facility-administered medications on file prior to visit.    ALLERGIES: Allergies  Allergen Reactions   Sulfa Antibiotics Rash    FAMILY HISTORY: Family History  Problem Relation Age of Onset   Cancer Paternal Aunt        melenoma   Diabetes Maternal Grandmother    Hypertension Maternal Grandmother    Cancer Maternal Grandfather        Brain and Lung   Diabetes Maternal Grandfather    Cancer Paternal Grandmother        breast   Cancer Maternal Uncle       Objective:  *** General: No acute distress.  Patient appears well-groomed.   Head:  Normocephalic/atraumatic Eyes:  Fundi examined but not visualized Neck: supple, no paraspinal tenderness, full range of motion Heart:  Regular rate and rhythm Neurological Exam: ***   Shon Millet, DO  CC: Edwina Barth, MD

## 2022-09-06 ENCOUNTER — Encounter: Payer: Self-pay | Admitting: Neurology

## 2022-09-06 ENCOUNTER — Ambulatory Visit (INDEPENDENT_AMBULATORY_CARE_PROVIDER_SITE_OTHER): Payer: Commercial Managed Care - PPO | Admitting: Neurology

## 2022-09-06 VITALS — BP 124/85 | HR 77 | Ht 63.0 in | Wt 152.4 lb

## 2022-09-06 DIAGNOSIS — G43009 Migraine without aura, not intractable, without status migrainosus: Secondary | ICD-10-CM | POA: Diagnosis not present

## 2022-09-06 MED ORDER — NORTRIPTYLINE HCL 10 MG PO CAPS: 30.0000 mg | ORAL_CAPSULE | Freq: Every day | ORAL | 5 refills | Status: DC

## 2022-09-06 NOTE — Patient Instructions (Signed)
Increase nortriptyline to 30mg  at bedtime.  If no continued improvement in 6 weeks, contact me and we will increase to 50mg  at bedtime Take Ubrelvy earliest onset of migraine.  May repeat after 2 hours.  Maximum 2 tablets in 24 hours. Consider magnesium citrate 400mg  daily, riboflavin 400mg  daily AND CoQ10 300mg  daily.  Alternatively, you can get a combination pill with these supplements (with feverfew) called Migrelief (take 2 daily)

## 2022-10-01 ENCOUNTER — Other Ambulatory Visit: Payer: Self-pay | Admitting: Neurology

## 2022-10-01 ENCOUNTER — Encounter: Payer: Self-pay | Admitting: Neurology

## 2022-10-01 MED ORDER — NORTRIPTYLINE HCL 50 MG PO CAPS: 50.0000 mg | ORAL_CAPSULE | Freq: Every day | ORAL | 5 refills | Status: DC

## 2022-10-04 ENCOUNTER — Ambulatory Visit: Payer: Commercial Managed Care - PPO | Admitting: Neurology

## 2022-11-08 ENCOUNTER — Encounter (INDEPENDENT_AMBULATORY_CARE_PROVIDER_SITE_OTHER): Payer: Self-pay

## 2022-11-20 ENCOUNTER — Encounter: Payer: Commercial Managed Care - PPO | Admitting: Emergency Medicine

## 2022-11-27 ENCOUNTER — Encounter: Payer: Self-pay | Admitting: Emergency Medicine

## 2022-11-27 ENCOUNTER — Ambulatory Visit (INDEPENDENT_AMBULATORY_CARE_PROVIDER_SITE_OTHER): Payer: Commercial Managed Care - PPO | Admitting: Emergency Medicine

## 2022-11-27 ENCOUNTER — Encounter: Payer: Commercial Managed Care - PPO | Admitting: Emergency Medicine

## 2022-11-27 VITALS — BP 96/60 | HR 98 | Temp 97.9°F | Resp 16 | Ht 63.25 in | Wt 150.2 lb

## 2022-11-27 DIAGNOSIS — Z1211 Encounter for screening for malignant neoplasm of colon: Secondary | ICD-10-CM

## 2022-11-27 DIAGNOSIS — Z13220 Encounter for screening for lipoid disorders: Secondary | ICD-10-CM

## 2022-11-27 DIAGNOSIS — Z130 Encounter for screening for diseases of the blood and blood-forming organs and certain disorders involving the immune mechanism: Secondary | ICD-10-CM | POA: Diagnosis not present

## 2022-11-27 DIAGNOSIS — Z23 Encounter for immunization: Secondary | ICD-10-CM

## 2022-11-27 DIAGNOSIS — Z0000 Encounter for general adult medical examination without abnormal findings: Secondary | ICD-10-CM

## 2022-11-27 DIAGNOSIS — Z13228 Encounter for screening for other metabolic disorders: Secondary | ICD-10-CM

## 2022-11-27 DIAGNOSIS — Z1329 Encounter for screening for other suspected endocrine disorder: Secondary | ICD-10-CM

## 2022-11-27 DIAGNOSIS — Z800 Family history of malignant neoplasm of digestive organs: Secondary | ICD-10-CM

## 2022-11-27 LAB — COMPREHENSIVE METABOLIC PANEL
ALT: 23 U/L (ref 0–35)
AST: 24 U/L (ref 0–37)
Albumin: 4.4 g/dL (ref 3.5–5.2)
Alkaline Phosphatase: 44 U/L (ref 39–117)
BUN: 17 mg/dL (ref 6–23)
CO2: 29 meq/L (ref 19–32)
Calcium: 9.9 mg/dL (ref 8.4–10.5)
Chloride: 102 meq/L (ref 96–112)
Creatinine, Ser: 0.86 mg/dL (ref 0.40–1.20)
GFR: 84.3 mL/min (ref >60.00)
Glucose, Bld: 80 mg/dL (ref 70–99)
Potassium: 4 meq/L (ref 3.5–5.1)
Sodium: 139 meq/L (ref 135–145)
Total Bilirubin: 0.9 mg/dL (ref 0.2–1.2)
Total Protein: 7.5 g/dL (ref 6.0–8.3)

## 2022-11-27 LAB — CBC WITH DIFFERENTIAL/PLATELET
Basophils Absolute: 0 10*3/uL (ref 0.0–0.1)
Basophils Relative: 0.5 % (ref 0.0–3.0)
Eosinophils Absolute: 0.1 10*3/uL (ref 0.0–0.7)
Eosinophils Relative: 1.1 % (ref 0.0–5.0)
HCT: 40.9 % (ref 36.0–46.0)
Hemoglobin: 13.4 g/dL (ref 12.0–15.0)
Lymphocytes Relative: 27.7 % (ref 12.0–46.0)
Lymphs Abs: 2.1 10*3/uL (ref 0.7–4.0)
MCHC: 32.8 g/dL (ref 30.0–36.0)
MCV: 86.8 fl (ref 78.0–100.0)
Monocytes Absolute: 0.5 10*3/uL (ref 0.1–1.0)
Monocytes Relative: 6 % (ref 3.0–12.0)
Neutro Abs: 5 10*3/uL (ref 1.4–7.7)
Neutrophils Relative %: 64.7 % (ref 43.0–77.0)
Platelets: 216 10*3/uL (ref 150.0–400.0)
RBC: 4.71 Mil/uL (ref 3.87–5.11)
RDW: 13.2 % (ref 11.5–15.5)
WBC: 7.7 10*3/uL (ref 4.0–10.5)

## 2022-11-27 LAB — LIPID PANEL
Cholesterol: 182 mg/dL (ref 0–200)
HDL: 75.8 mg/dL (ref >39.00)
LDL Cholesterol: 92 mg/dL (ref 0–99)
NonHDL: 106.62
Total CHOL/HDL Ratio: 2
Triglycerides: 73 mg/dL (ref 0.0–149.0)
VLDL: 14.6 mg/dL (ref 0.0–40.0)

## 2022-11-27 LAB — HEMOGLOBIN A1C: Hgb A1c MFr Bld: 5.1 % (ref 4.6–6.5)

## 2022-11-27 NOTE — Patient Instructions (Signed)

## 2022-11-27 NOTE — Progress Notes (Signed)
Emily Chan 42 y.o.   Chief Complaint  Patient presents with   Annual Exam    HISTORY OF PRESENT ILLNESS: [name] here for annual exam. Family history of colon cancer.  Grandmother and uncle, maternal side. Some easy bruising lately. No other complaints or medical concerns today.  HPI   Prior to Admission medications   Medication Sig Start Date End Date Taking? Authorizing Provider  doxycycline (VIBRA-TABS) 100 MG tablet Take 100 mg by mouth 2 (two) times daily. 11/22/22  Yes [provider]  levonorgestrel (LILETTA, 52 MG,) 19.5 MCG/DAY IUD IUD 1 each by Intrauterine route once.   Yes [provider]  nortriptyline (PAMELOR) 50 MG capsule Take 1 capsule (50 mg total) by mouth at bedtime. 10/01/22  Yes Jaffe, Adam R, DO  Ferrous Sulfate (FE-CAPS PO) Take by mouth. Patient not taking: Reported on 09/06/2022    [provider]  Rimegepant Sulfate (NURTEC) 75 MG TBDP Take 1 tablet (75 mg total) by mouth daily as needed. 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   Mild dysplasia of cervix 07/16/2013    Past Medical History:  Diagnosis Date   Abdominal cramping affecting pregnancy 08/24/2014   Abnormal Pap smear    Bleeding in early pregnancy 09/02/2014   Pelvic rest   Contraception management 06/16/2012   Contraceptive management 05/03/2015   Hemorrhoids 05/03/2015   History of abnormal cervical Pap smear 08/18/2015   Hx: UTI (urinary tract infection)    Miscarriage    Numbness in both legs 08/18/2015   Has numbness and tingling in both legs since epidural, on and off esp at night   Pregnant 01/28/2014   Vaginal irritation 12/25/2012   Vaginal Pap smear, abnormal    Yeast infection 12/25/2012    Past  Surgical History:  Procedure Laterality Date   APPENDECTOMY     WISDOM TOOTH EXTRACTION      Social History   Socioeconomic History   Marital status: Married    Spouse name: Not on file   Number of children: Not on file   Years of education: Not on file   Highest education level: Not on file  Occupational History   Not on file  Tobacco Use   Smoking status: Never   Smokeless tobacco: Never  Vaping Use   Vaping status: Never Used  Substance and Sexual Activity   Alcohol use: No    Comment: occ.   Drug use: No   Sexual activity: Yes    Birth control/protection: I.U.D.  Other Topics Concern   Not on file  Social History Narrative   Not on file   Social Determinants of Health   Financial Resource Strain: Low Risk  (01/22/2022)   Overall Financial Resource Strain (CARDIA)    Difficulty of Paying Living Expenses: Not hard at all  Food Insecurity: No Food Insecurity (01/22/2022)   Hunger Vital Sign    Worried About Running Out of Food in the Last Year: Never true    Ran Out of Food in the Last Year: Never true  Transportation Needs: No Transportation Needs (01/22/2022)   PRAPARE - Administrator, Civil Service (Medical): No    Lack of Transportation (Non-Medical): No  Physical  Activity: Sufficiently Active (01/22/2022)   Exercise Vital Sign    Days of Exercise per Week: 5 days    Minutes of Exercise per Session: 40 min  Stress: No Stress Concern Present (01/22/2022)   [employer] of Occupational Health - Occupational Stress Questionnaire    Feeling of Stress : Only a little  Social Connections: Moderately Integrated (01/22/2022)   Social Connection and Isolation Panel [NHANES]    Frequency of Communication with Friends and Family: More than three times a week    Frequency of Social Gatherings with Friends and Family: More than three times a week    Attends Religious Services: 1 to 4 times per year    Active Member of [employer] or Organizations: No     Attends Banker Meetings: Never    Marital Status: Married  Catering manager Violence: Not At Risk (01/22/2022)   Humiliation, Afraid, Rape, and Kick questionnaire    Fear of Current or Ex-Partner: No    Emotionally Abused: No    Physically Abused: No    Sexually Abused: No    Family History  Problem Relation Age of Onset   Cancer Paternal Aunt        melenoma   Diabetes Maternal Grandmother    Hypertension Maternal Grandmother    Cancer Maternal Grandfather        Brain and Lung   Diabetes Maternal Grandfather    Cancer Paternal Grandmother        breast   Cancer Maternal Uncle      Review of Systems  Constitutional: Negative.  Negative for chills and fever.  HENT: Negative.  Negative for congestion and sore throat.   Respiratory: Negative.  Negative for cough and shortness of breath.   Cardiovascular: Negative.  Negative for chest pain and palpitations.  Gastrointestinal:  Negative for abdominal pain, diarrhea, nausea and vomiting.  Genitourinary: Negative.  Negative for dysuria and hematuria.  Skin: Negative.  Negative for rash.  Neurological: Negative.  Negative for dizziness and headaches.  All other systems reviewed and are negative.   Vitals:   11/27/22 1437  BP: 96/60  Pulse: 98  Resp: 16  Temp: 97.9 F (36.6 C)  SpO2: 98%    Physical Exam Vitals reviewed.  Constitutional:      Appearance: Normal appearance.  HENT:     Head: Normocephalic.     Right Ear: Tympanic membrane, ear canal and external ear normal.     Left Ear: Tympanic membrane, ear canal and external ear normal.     Mouth/Throat:     Mouth: Mucous membranes are moist.     Pharynx: Oropharynx is clear.  Eyes:     Extraocular Movements: Extraocular movements intact.     Conjunctiva/sclera: Conjunctivae normal.     Pupils: Pupils are equal, round, and reactive to light.  Cardiovascular:     Rate and Rhythm: Normal rate and regular rhythm.     Pulses: Normal pulses.      Heart sounds: Normal heart sounds.  Pulmonary:     Effort: Pulmonary effort is normal.     Breath sounds: Normal breath sounds.  Abdominal:     Palpations: Abdomen is soft.     Tenderness: There is no abdominal tenderness.  Musculoskeletal:     Cervical back: No tenderness.  Lymphadenopathy:     Cervical: No cervical adenopathy.  Skin:    General: Skin is warm and dry.     Capillary Refill: Capillary refill takes less than 2 seconds.  Neurological:     General: No focal deficit present.     Mental Status: She is alert and oriented to person, place, and time.  Psychiatric:        Mood and Affect: Mood normal.        Behavior: Behavior normal.      ASSESSMENT & PLAN: Problem List Items Addressed This Visit   None Visit Diagnoses     Routine general medical examination at a health care facility    -  Primary   Relevant Orders   CBC with Differential   Comprehensive metabolic panel   Hemoglobin A1c   Lipid panel   Ambulatory referral to Gastroenterology   Flu vaccine need       Relevant Orders   Flu Vaccine QUAD 6+ mos PF IM (Fluarix Quad PF)   Family history of colon cancer       Relevant Orders   Ambulatory referral to Gastroenterology   Colon cancer screening       Relevant Orders   Ambulatory referral to Gastroenterology   Screening for deficiency anemia       Relevant Orders   CBC with Differential   Screening for lipoid disorders       Relevant Orders   Lipid panel   Screening for endocrine, metabolic and immunity disorder       Relevant Orders   Comprehensive metabolic panel   Hemoglobin A1c      Modifiable risk factors discussed with patient. Anticipatory guidance according to age provided. The following topics were also discussed: Social Determinants of Health Smoking.  Non-smoker Diet and nutrition Benefits of exercise Cancer family history review and need for colon cancer screening with colonoscopy Vaccinations review and  recommendations Cardiovascular risk assessment and need for blood work Mental health including depression and anxiety Fall and accident prevention  Patient Instructions  Health Maintenance, Female Adopting a healthy lifestyle and getting preventive care are important in promoting health and wellness. Ask your health care provider about: The right schedule for you to have regular tests and exams. Things you can do on your own to prevent diseases and keep yourself healthy. What should I know about diet, weight, and exercise? Eat a healthy diet  Eat a diet that includes plenty of vegetables, fruits, low-fat dairy products, and lean protein. Do not eat a lot of foods that are high in solid fats, added sugars, or sodium. Maintain a healthy weight Body mass index (BMI) is used to identify weight problems. It estimates body fat based on height and weight. Your health care provider can help determine your BMI and help you achieve or maintain a healthy weight. Get regular exercise Get regular exercise. This is one of the most important things you can do for your health. Most adults should: Exercise for at least 150 minutes each week. The exercise should increase your heart rate and make you sweat (moderate-intensity exercise). Do strengthening exercises at least twice a week. This is in addition to the moderate-intensity exercise. Spend less time sitting. Even light physical activity can be beneficial. Watch cholesterol and blood lipids Have your blood tested for lipids and cholesterol at 41 years of age, then have this test every 5 years. Have your cholesterol levels checked more often if: Your lipid or cholesterol levels are high. You are older than 41 years of age. You are at high risk for heart disease. What should I know about cancer screening? Depending on your health history and family history, you may need  to have cancer screening at various ages. This may include screening for: Breast  cancer. Cervical cancer. Colorectal cancer. Skin cancer. Lung cancer. What should I know about heart disease, diabetes, and high blood pressure? Blood pressure and heart disease High blood pressure causes heart disease and increases the risk of stroke. This is more likely to develop in people who have high blood pressure readings or are overweight. Have your blood pressure checked: Every 3-5 years if you are 10-59 years of age. Every year if you are 64 years old or older. Diabetes Have regular diabetes screenings. This checks your fasting blood sugar level. Have the screening done: Once every three years after age 39 if you are at a normal weight and have a low risk for diabetes. More often and at a younger age if you are overweight or have a high risk for diabetes. What should I know about preventing infection? Hepatitis B If you have a higher risk for hepatitis B, you should be screened for this virus. Talk with your health care provider to find out if you are at risk for hepatitis B infection. Hepatitis C Testing is recommended for: Everyone born from [birth year] through 1964. Anyone with known risk factors for hepatitis C. Sexually transmitted infections (STIs) Get screened for STIs, including gonorrhea and chlamydia, if: You are sexually active and are younger than 41 years of age. You are older than 41 years of age and your health care provider tells you that you are at risk for this type of infection. Your sexual activity has changed since you were last screened, and you are at increased risk for chlamydia or gonorrhea. Ask your health care provider if you are at risk. Ask your health care provider about whether you are at high risk for HIV. Your health care provider may recommend a prescription medicine to help prevent HIV infection. If you choose to take medicine to prevent HIV, you should first get tested for HIV. You should then be tested every 3 months for as long as you are taking  the medicine. Pregnancy If you are about to stop having your period (premenopausal) and you may become pregnant, seek counseling before you get pregnant. Take 400 to 800 micrograms (mcg) of folic acid every day if you become pregnant. Ask for birth control (contraception) if you want to prevent pregnancy. Osteoporosis and menopause Osteoporosis is a disease in which the bones lose minerals and strength with aging. This can result in bone fractures. If you are 38 years old or older, or if you are at risk for osteoporosis and fractures, ask your health care provider if you should: Be screened for bone loss. Take a calcium or vitamin D supplement to lower your risk of fractures. Be given hormone replacement therapy (HRT) to treat symptoms of menopause. Follow these instructions at home: Alcohol use Do not drink alcohol if: Your health care provider tells you not to drink. You are pregnant, may be pregnant, or are planning to become pregnant. If you drink alcohol: Limit how much you have to: 0-1 drink a day. Know how much alcohol is in your drink. In the [place] one drink equals one 12 oz bottle of beer (355 mL), one 5 oz glass of wine (148 mL), or one 1 oz glass of hard liquor (44 mL). Lifestyle Do not use any products that contain nicotine or tobacco. These products include cigarettes, chewing tobacco, and vaping devices, such as e-cigarettes. If you need help quitting, ask your health care provider.  Do not use street drugs. Do not share needles. Ask your health care provider for help if you need support or information about quitting drugs. General instructions Schedule regular health, dental, and eye exams. Stay current with your vaccines. Tell your health care provider if: You often feel depressed. You have ever been abused or do not feel safe at home. Summary Adopting a healthy lifestyle and getting preventive care are important in promoting health and wellness. Follow your health  care provider's instructions about healthy diet, exercising, and getting tested or screened for diseases. Follow your health care provider's instructions on monitoring your cholesterol and blood pressure. This information is not intended to replace advice given to you by your health care provider. Make sure you discuss any questions you have with your health care provider. Document Revised: 08/01/2020 Document Reviewed: 08/01/2020 Elsevier Patient Education  2024 Elsevier Inc.       Edwina Barth, MD [facility]

## 2022-12-02 ENCOUNTER — Encounter: Payer: Self-pay | Admitting: Emergency Medicine

## 2022-12-02 DIAGNOSIS — K625 Hemorrhage of anus and rectum: Secondary | ICD-10-CM

## 2022-12-03 NOTE — Telephone Encounter (Signed)
Please advise if patient needs an appointment.

## 2022-12-04 NOTE — Telephone Encounter (Signed)
Okay to refer to gastroenterologist.  Diagnosis: Rectal bleeding.  Thanks.

## 2022-12-10 ENCOUNTER — Ambulatory Visit
Admission: RE | Admit: 2022-12-10 | Discharge: 2022-12-10 | Disposition: A | Discharge status: Home / Self Care | Payer: Commercial Managed Care - PPO | Source: Ambulatory Visit | Attending: Family Medicine | Admitting: Family Medicine

## 2022-12-10 VITALS — BP 137/76 | HR 93 | Temp 98.2°F | Resp 16

## 2022-12-10 DIAGNOSIS — H1031 Unspecified acute conjunctivitis, right eye: Secondary | ICD-10-CM | POA: Diagnosis not present

## 2022-12-10 MED ORDER — POLYMYXIN B-TRIMETHOPRIM 10000-0.1 UNIT/ML-% OP SOLN: 1.0000 [drp] | Freq: Four times a day (QID) | OPHTHALMIC | 0 refills | Status: DC

## 2022-12-10 NOTE — Addendum Note (Signed)
Addended by: Theressa Stamps on: 12/10/2022 03:42 PM   Modules accepted: Orders

## 2022-12-10 NOTE — ED Provider Notes (Signed)
[facility]    CSN: [id number] Arrival date & time: 12/10/22  1345      History   Chief Complaint Chief Complaint  Patient presents with   Eye Problem    Pain and redness in my right eye. - Entered by patient    HPI Emily Chan is a 42 y.o. female.   Patient presenting today with several day history of right eye redness, itching and irritation.  Denies thick drainage, fever, chills, visual change, headache, nausea, vomiting, injury to the eye.  So far trying Visine drops with no relief.  States her children have had pinkeye this past week.    Past Medical History:  Diagnosis Date   Abdominal cramping affecting pregnancy 08/24/2014   Abnormal Pap smear    Bleeding in early pregnancy 09/02/2014   Pelvic rest   Contraception management 06/16/2012   Contraceptive management 05/03/2015   Hemorrhoids 05/03/2015   History of abnormal cervical Pap smear 08/18/2015   Hx: UTI (urinary tract infection)    Miscarriage    Numbness in both legs 08/18/2015   Has numbness and tingling in both legs since epidural, on and off esp at night   Pregnant 01/28/2014   Vaginal irritation 12/25/2012   Vaginal Pap smear, abnormal    Yeast infection 12/25/2012    Patient Active Problem List   Diagnosis Date Noted   IUD (intrauterine device) in place 06/11/2022   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 Authorizing Provider  nortriptyline (PAMELOR) 50 MG capsule Take 1 capsule (50 mg total) by mouth at  bedtime. 10/01/22  Yes Jaffe, Adam R, DO  trimethoprim-polymyxin b (POLYTRIM) ophthalmic solution Place 1 drop into the right eye every 6 (six) hours. 12/10/22  Yes Particia Nearing, PA-C  doxycycline (VIBRA-TABS) 100 MG tablet Take 100 mg by mouth 2 (two) times daily. 11/22/22   [provider]  Ferrous Sulfate (FE-CAPS PO) Take by mouth. Patient not taking: Reported on 09/06/2022    [provider]  levonorgestrel (LILETTA, 52 MG,) 19.5 MCG/DAY IUD IUD 1 each by Intrauterine route once.    [provider]  Rimegepant Sulfate (NURTEC) 75 MG TBDP Take 1 tablet (75 mg total) by mouth daily as needed. Patient not taking: Reported on 09/06/2022 05/14/22   Georgina Quint, MD    Family History Family History  Problem Relation Age of Onset   Cancer Paternal Aunt        melenoma   Diabetes Maternal Grandmother    Hypertension Maternal Grandmother    Cancer Maternal Grandfather        Brain and Lung   Diabetes Maternal Grandfather    Cancer Paternal Grandmother  breast   Cancer Maternal Uncle     Social History Social History   Tobacco Use   Smoking status: Never   Smokeless tobacco: Never  Vaping Use   Vaping status: Never Used  Substance Use Topics   Alcohol use: No    Comment: occ.   Drug use: No     Allergies   Sulfa antibiotics   Review of Systems Review of Systems Per HPI  Physical Exam Triage Vital Signs ED Triage Vitals  Encounter Vitals Group     BP 12/10/22 1446 137/76     Systolic BP Percentile --      Diastolic BP Percentile --      Pulse Rate 12/10/22 1446 93     Resp 12/10/22 1446 16     Temp 12/10/22 1446 98.2 F (36.8 C)     Temp Source 12/10/22 1446 Oral     SpO2 12/10/22 1446 97 %     Weight --      Height --      Head Circumference --      Peak Flow --      Pain Score 12/10/22 1459 2     Pain Loc --      Pain Education --      Exclude from Growth Chart --    No data found.  Updated Vital  Signs BP 137/76 (BP Location: Right Arm)   Pulse 93   Temp 98.2 F (36.8 C) (Oral)   Resp 16   LMP  (LMP Unknown)   SpO2 97%   Visual Acuity Right Eye Distance:   Left Eye Distance:   Bilateral Distance:    Right Eye Near:   Left Eye Near:    Bilateral Near:     Physical Exam Vitals and nursing note reviewed.  Constitutional:      Appearance: Normal appearance. She is not ill-appearing.  HENT:     Head: Atraumatic.     Mouth/Throat:     Mouth: Mucous membranes are moist.     Pharynx: Oropharynx is clear.  Eyes:     Extraocular Movements: Extraocular movements intact.     Comments: Right conjunctival erythema, injection particularly laterally.  No foreign body appreciable  Cardiovascular:     Rate and Rhythm: Normal rate and regular rhythm.     Heart sounds: Normal heart sounds.  Pulmonary:     Effort: Pulmonary effort is normal.     Breath sounds: Normal breath sounds.  Musculoskeletal:        General: Normal range of motion.     Cervical back: Normal range of motion and neck supple.  Skin:    General: Skin is warm and dry.  Neurological:     Mental Status: She is alert and oriented to person, place, and time.  Psychiatric:        Mood and Affect: Mood normal.        Thought Content: Thought content normal.        Judgment: Judgment normal.      UC Treatments / Results  Labs (all labs ordered are listed, but only abnormal results are displayed) Labs Reviewed - No data to display  EKG   Radiology No results found.  Procedures Procedures (including critical care time)  Medications Ordered in UC Medications - No data to display  Initial Impression / Assessment and Plan / UC Course  I have reviewed the triage vital signs and the nursing notes.  Pertinent labs & imaging results that  were available during my care of the patient were reviewed by me and considered in my medical decision making (see chart for details).     Treat with Polytrim drops,  warm compresses, good hand hygiene.  Return for worsening symptoms.  Final Clinical Impressions(s) / UC Diagnoses   Final diagnoses:  Acute bacterial conjunctivitis of right eye   Discharge Instructions   None    ED Prescriptions     Medication Sig Dispense Auth. Provider   trimethoprim-polymyxin b (POLYTRIM) ophthalmic solution Place 1 drop into the right eye every 6 (six) hours. 10 mL Particia Nearing, [place]      PDMP not reviewed this encounter.   Particia Nearing, [place] 12/10/22 1520

## 2022-12-10 NOTE — ED Triage Notes (Signed)
Pressure and redness to right eye that started Thursday. Took visine eye drops with no relief.

## 2022-12-11 ENCOUNTER — Telehealth: Payer: Self-pay | Admitting: Emergency Medicine

## 2022-12-11 MED ORDER — OFLOXACIN 0.3 % OP SOLN: 1.0000 [drp] | Freq: Four times a day (QID) | OPHTHALMIC | 0 refills | Status: DC

## 2022-12-11 NOTE — Telephone Encounter (Signed)
Pt called and inquired about eye swelling and drainage since starting px eye drops. Consulted PA and reported would change px and for pt to start taking otc zyrtec. Pt verbalized understanding and reported would send spouse to pick up new px.

## 2022-12-11 NOTE — Telephone Encounter (Signed)
Eye Swelling with polytrim drops, d/c and take antihistamines, ocuflox drops sent instead. Follow up for worsening sxs

## 2022-12-13 ENCOUNTER — Encounter: Payer: Self-pay | Admitting: Nurse Practitioner

## 2023-01-09 ENCOUNTER — Ambulatory Visit: Payer: Commercial Managed Care - PPO | Admitting: Emergency Medicine

## 2023-01-09 VITALS — BP 136/94 | HR 86 | Temp 98.4°F | Ht 63.25 in | Wt 151.6 lb

## 2023-01-09 DIAGNOSIS — J22 Unspecified acute lower respiratory infection: Secondary | ICD-10-CM | POA: Diagnosis not present

## 2023-01-09 DIAGNOSIS — R6889 Other general symptoms and signs: Secondary | ICD-10-CM | POA: Diagnosis not present

## 2023-01-09 MED ORDER — AZITHROMYCIN 250 MG PO TABS
ORAL_TABLET | ORAL | 0 refills | Status: DC
Start: 2023-01-09 — End: 2023-01-28

## 2023-01-09 NOTE — Assessment & Plan Note (Signed)
Advised to rest and stay well-hydrated Continue over-the-counter Mucinex DM, NyQuil at bedtime Take Advil and or Tylenol as needed

## 2023-01-09 NOTE — Progress Notes (Signed)
Emily Chan 42 y.o.   Chief Complaint  Patient presents with   Cough    Sine last Thursday gets worst in the morning and at night. Greenish/ Yellow phlegm    chest congestion    Sine last Thursday gets worst in the morning and at night.     HISTORY OF PRESENT ILLNESS: Acute problem visit today. [name] complaining of flulike symptoms that started 1 week ago progressively getting worse.  Complaining of productive cough.  Denies difficulty breathing.  Denies high fever or chills.  Has chest congestion but no wheezing. No other associated symptoms. No other complaints or medical concerns today.  Cough Pertinent negatives include no chest pain, chills, fever, headaches, hemoptysis, rash, sore throat, shortness of breath or wheezing.     Prior to Admission medications   Medication Sig Start Date End Date Taking? Authorizing Provider  benzonatate (TESSALON) 200 MG capsule Take 200 mg by mouth 3 (three) times daily as needed for cough. 01/08/23  Yes [provider]  Ferrous Sulfate (FE-CAPS PO) Take by mouth.   Yes [provider]  levonorgestrel (LILETTA, 52 MG,) 19.5 MCG/DAY IUD IUD 1 each by Intrauterine route once.   Yes [provider]  nortriptyline (PAMELOR) 50 MG capsule Take 1 capsule (50 mg total) by mouth at bedtime. 10/01/22  Yes Jaffe, Adam R, DO  Rimegepant Sulfate (NURTEC) 75 MG TBDP Take 1 tablet (75 mg total) by mouth daily as needed. 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    Past Medical History:  Diagnosis Date   Abdominal cramping affecting pregnancy 08/24/2014   Abnormal Pap smear    Bleeding in early pregnancy 09/02/2014   Pelvic rest   Contraception management 06/16/2012    Contraceptive management 05/03/2015   Hemorrhoids 05/03/2015   History of abnormal cervical Pap smear 08/18/2015   Hx: UTI (urinary tract infection)    Miscarriage    Numbness in both legs 08/18/2015   Has numbness and tingling in both legs since epidural, on and off esp at night   Pregnant 01/28/2014   Vaginal irritation 12/25/2012   Vaginal Pap smear, abnormal    Yeast infection 12/25/2012    Past Surgical History:  Procedure Laterality Date   APPENDECTOMY     WISDOM TOOTH EXTRACTION      Social History   Socioeconomic History   Marital status: Married    Spouse name: Not on file   Number of children: Not on file   Years of education: Not on file   Highest education level: Doctorate  Occupational History   Not on file  Tobacco Use   Smoking status: Never   Smokeless tobacco: Never  Vaping Use   Vaping status: Never Used  Substance and Sexual Activity   Alcohol use: No    Comment: occ.   Drug use: No   Sexual activity: Yes    Birth control/protection: I.U.D.  Other Topics Concern   Not on file  Social History Narrative   Not on file   Social Determinants of Health   Financial Resource Strain: Low Risk  (01/09/2023)   Overall Financial Resource Strain (CARDIA)    Difficulty of Paying Living Expenses: Not hard at all  Food Insecurity: No Food Insecurity (01/09/2023)   Hunger Vital Sign    Worried About Running Out of Food in the Last Year: Never true    Ran Out of Food in the Last Year: Never true  Transportation Needs: No Transportation Needs (01/09/2023)   PRAPARE - Administrator, Civil Service (Medical): No    Lack of Transportation (Non-Medical): No  Physical Activity: Sufficiently Active (01/09/2023)   Exercise Vital Sign    Days of Exercise per Week: 6 days    Minutes of Exercise per Session: 40 min  Stress: No Stress Concern Present (01/09/2023)   [employer] of Occupational Health - Occupational Stress Questionnaire    Feeling of Stress  : Only a little  Social Connections: Moderately Isolated (01/09/2023)   Social Connection and Isolation Panel [NHANES]    Frequency of Communication with Friends and Family: More than three times a week    Frequency of Social Gatherings with Friends and Family: More than three times a week    Attends Religious Services: Never    Database administrator or Organizations: No    Attends Banker Meetings: Never    Marital Status: Married  Catering manager Violence: Not At Risk (01/22/2022)   Humiliation, Afraid, Rape, and Kick questionnaire    Fear of Current or Ex-Partner: No    Emotionally Abused: No    Physically Abused: No    Sexually Abused: No    Family History  Problem Relation Age of Onset   Cancer Paternal Aunt        melenoma   Diabetes Maternal Grandmother    Hypertension Maternal Grandmother    Cancer Maternal Grandfather        Brain and Lung   Diabetes Maternal Grandfather    Cancer Paternal Grandmother        breast   Cancer Maternal Uncle      Review of Systems  Constitutional: Negative.  Negative for chills and fever.  HENT: Negative.  Negative for congestion and sore throat.   Respiratory:  Positive for cough and sputum production. Negative for hemoptysis, shortness of breath and wheezing.   Cardiovascular: Negative.  Negative for chest pain and palpitations.  Gastrointestinal:  Negative for abdominal pain, diarrhea, nausea and vomiting.  Genitourinary: Negative.  Negative for dysuria and hematuria.  Skin: Negative.  Negative for rash.  Neurological: Negative.  Negative for dizziness and headaches.  All other systems reviewed and are negative.   Vitals:   01/09/23 1507  BP: (!) 136/94  Pulse: 86  Temp: 98.4 F (36.9 C)  SpO2: 99%    Physical Exam Vitals reviewed.  Constitutional:      Appearance: Normal appearance.  HENT:     Head: Normocephalic.     Right Ear: Tympanic membrane, ear canal and external ear normal.     Left Ear:  Tympanic membrane, ear canal and external ear normal.     Mouth/Throat:     Mouth: Mucous membranes are moist.     Pharynx: Oropharynx is clear.  Eyes:     Extraocular Movements: Extraocular movements intact.     Conjunctiva/sclera: Conjunctivae normal.     Pupils: Pupils are equal, round, and reactive to light.  Cardiovascular:     Rate and Rhythm: Normal rate and regular rhythm.     Pulses: Normal pulses.     Heart sounds: Normal heart sounds.  Pulmonary:     Effort: Pulmonary effort is normal.     Breath  sounds: Normal breath sounds.  Musculoskeletal:     Cervical back: No tenderness.  Lymphadenopathy:     Cervical: No cervical adenopathy.  Skin:    General: Skin is warm and dry.     Capillary Refill: Capillary refill takes less than 2 seconds.  Neurological:     General: No focal deficit present.     Mental Status: She is alert and oriented to person, place, and time.  Psychiatric:        Mood and Affect: Mood normal.        Behavior: Behavior normal.      ASSESSMENT & PLAN: A total of 34 minutes was spent with the patient and counseling/coordination of care regarding preparing for this visit, review of most recent office visit notes, review of all medications, diagnosis of lower respiratory infection and need for antibiotics, symptom management, prognosis, documentation and need for follow-up if no better or worse during the next several days.  Problem List Items Addressed This Visit       Respiratory   Lower respiratory infection - Primary    Upper respiratory infection now developing secondary bacterial infection.  Recommend to start daily azithromycin for 5 days Clinically stable.  No signs of pneumonia. Advised to rest and stay well-hydrated. ED precautions given. Advised to contact the office if no better or worse during the next several days      Relevant Medications   azithromycin (ZITHROMAX) 250 MG tablet     Other   Flu-like symptoms    Advised to  rest and stay well-hydrated Continue over-the-counter Mucinex DM, NyQuil at bedtime Take Advil and or Tylenol as needed      Patient Instructions  Acute Bronchitis, Adult  Acute bronchitis is when air tubes in the lungs (bronchi) suddenly get swollen. The condition can make it hard for you to breathe. In adults, acute bronchitis usually goes away within 2 weeks. A cough caused by bronchitis may last up to 3 weeks. Smoking, allergies, and asthma can make the condition worse. What are the causes? Germs that cause cold and flu (viruses). The most common cause of this condition is the virus that causes the common cold. Bacteria. Substances that bother (irritate) the lungs, including: Smoke from cigarettes and other types of tobacco. Dust and pollen. Fumes from chemicals, gases, or burned fuel. Indoor or outdoor air pollution. What increases the risk? A weak body's defense system. This is also called the immune system. Any condition that affects your lungs and breathing, such as asthma. What are the signs or symptoms? A cough. Coughing up clear, yellow, or green mucus. Making high-pitched whistling sounds when you breathe, most often when you breathe out (wheezing). Runny or stuffy nose. Having too much mucus in your lungs (chest congestion). Shortness of breath. Body aches. A sore throat. How is this treated? Acute bronchitis may go away over time without treatment. Your doctor may tell you to: Drink more fluids. This will help thin your mucus so it is easier to cough up. Use a device that gets medicine into your lungs (inhaler). Use a vaporizer or a humidifier. These are machines that add water to the air. This helps with coughing and poor breathing. Take a medicine that thins mucus and helps clear it from your lungs. Take a medicine that prevents or stops coughing. It is not common to take an antibiotic medicine for this condition. Follow these instructions at home:  Take  over-the-counter and prescription medicines only as told by your doctor.  Use an inhaler, vaporizer, or humidifier as told by your doctor. Take two teaspoons (10 mL) of honey at bedtime. This helps lessen your coughing at night. Drink enough fluid to keep your pee (urine) pale yellow. Do not smoke or use any products that contain nicotine or tobacco. If you need help quitting, ask your doctor. Get a lot of rest. Return to your normal activities when your doctor says that it is safe. Keep all follow-up visits. How is this prevented?  Wash your hands often with soap and water for at least 20 seconds. If you cannot use soap and water, use hand sanitizer. Avoid contact with people who have cold symptoms. Try not to touch your mouth, nose, or eyes with your hands. Avoid breathing in smoke or chemical fumes. Make sure to get the flu shot every year. Contact a doctor if: Your symptoms do not get better in 2 weeks. You have trouble coughing up the mucus. Your cough keeps you awake at night. You have a fever. Get help right away if: You cough up blood. You have chest pain. You have very bad shortness of breath. You faint or keep feeling like you are going to faint. You have a very bad headache. Your fever or chills get worse. These symptoms may be an emergency. Get help right away. Call your local emergency services (911 in the [place] Do not wait to see if the symptoms will go away. Do not drive yourself to the hospital. Summary Acute bronchitis is when air tubes in the lungs (bronchi) suddenly get swollen. In adults, acute bronchitis usually goes away within 2 weeks. Drink more fluids. This will help thin your mucus so it is easier to cough up. Take over-the-counter and prescription medicines only as told by your doctor. Contact a doctor if your symptoms do not improve after 2 weeks of treatment. This information is not intended to replace advice given to you by your health care provider.  Make sure you discuss any questions you have with your health care provider. Document Revised: 07/13/2020 Document Reviewed: 07/13/2020 Elsevier Patient Education  2024 Elsevier Inc.     Edwina Barth, MD [facility]

## 2023-01-09 NOTE — Assessment & Plan Note (Signed)
Upper respiratory infection now developing secondary bacterial infection.  Recommend to start daily azithromycin for 5 days Clinically stable.  No signs of pneumonia. Advised to rest and stay well-hydrated. ED precautions given. Advised to contact the office if no better or worse during the next several days

## 2023-01-09 NOTE — Patient Instructions (Signed)
Acute Bronchitis, Adult  Acute bronchitis is when air tubes in the lungs (bronchi) suddenly get swollen. The condition can make it hard for you to breathe. In adults, acute bronchitis usually goes away within 2 weeks. A cough caused by bronchitis may last up to 3 weeks. Smoking, allergies, and asthma can make the condition worse. What are the causes? Germs that cause cold and flu (viruses). The most common cause of this condition is the virus that causes the common cold. Bacteria. Substances that bother (irritate) the lungs, including: Smoke from cigarettes and other types of tobacco. Dust and pollen. Fumes from chemicals, gases, or burned fuel. Indoor or outdoor air pollution. What increases the risk? A weak body's defense system. This is also called the immune system. Any condition that affects your lungs and breathing, such as asthma. What are the signs or symptoms? A cough. Coughing up clear, yellow, or green mucus. Making high-pitched whistling sounds when you breathe, most often when you breathe out (wheezing). Runny or stuffy nose. Having too much mucus in your lungs (chest congestion). Shortness of breath. Body aches. A sore throat. How is this treated? Acute bronchitis may go away over time without treatment. Your doctor may tell you to: Drink more fluids. This will help thin your mucus so it is easier to cough up. Use a device that gets medicine into your lungs (inhaler). Use a vaporizer or a humidifier. These are machines that add water to the air. This helps with coughing and poor breathing. Take a medicine that thins mucus and helps clear it from your lungs. Take a medicine that prevents or stops coughing. It is not common to take an antibiotic medicine for this condition. Follow these instructions at home:  Take over-the-counter and prescription medicines only as told by your doctor. Use an inhaler, vaporizer, or humidifier as told by your doctor. Take two teaspoons  (10 mL) of honey at bedtime. This helps lessen your coughing at night. Drink enough fluid to keep your pee (urine) pale yellow. Do not smoke or use any products that contain nicotine or tobacco. If you need help quitting, ask your doctor. Get a lot of rest. Return to your normal activities when your doctor says that it is safe. Keep all follow-up visits. How is this prevented?  Wash your hands often with soap and water for at least 20 seconds. If you cannot use soap and water, use hand sanitizer. Avoid contact with people who have cold symptoms. Try not to touch your mouth, nose, or eyes with your hands. Avoid breathing in smoke or chemical fumes. Make sure to get the flu shot every year. Contact a doctor if: Your symptoms do not get better in 2 weeks. You have trouble coughing up the mucus. Your cough keeps you awake at night. You have a fever. Get help right away if: You cough up blood. You have chest pain. You have very bad shortness of breath. You faint or keep feeling like you are going to faint. You have a very bad headache. Your fever or chills get worse. These symptoms may be an emergency. Get help right away. Call your local emergency services (911 in the [place] Do not wait to see if the symptoms will go away. Do not drive yourself to the hospital. Summary Acute bronchitis is when air tubes in the lungs (bronchi) suddenly get swollen. In adults, acute bronchitis usually goes away within 2 weeks. Drink more fluids. This will help thin your mucus so it is easier  to cough up. Take over-the-counter and prescription medicines only as told by your doctor. Contact a doctor if your symptoms do not improve after 2 weeks of treatment. This information is not intended to replace advice given to you by your health care provider. Make sure you discuss any questions you have with your health care provider. Document Revised: 07/13/2020 Document Reviewed: 07/13/2020 Elsevier Patient  Education  2024 [employer].

## 2023-01-22 ENCOUNTER — Encounter: Payer: Self-pay | Admitting: Neurology

## 2023-01-28 ENCOUNTER — Encounter: Payer: Self-pay | Admitting: Adult Health

## 2023-01-28 ENCOUNTER — Ambulatory Visit: Payer: Commercial Managed Care - PPO | Admitting: Adult Health

## 2023-01-28 VITALS — BP 116/72 | HR 81 | Ht 63.0 in | Wt 154.0 lb

## 2023-01-28 DIAGNOSIS — Z975 Presence of (intrauterine) contraceptive device: Secondary | ICD-10-CM

## 2023-01-28 DIAGNOSIS — Z01419 Encounter for gynecological examination (general) (routine) without abnormal findings: Secondary | ICD-10-CM | POA: Diagnosis not present

## 2023-01-28 DIAGNOSIS — Z1211 Encounter for screening for malignant neoplasm of colon: Secondary | ICD-10-CM | POA: Insufficient documentation

## 2023-01-28 DIAGNOSIS — Z1231 Encounter for screening mammogram for malignant neoplasm of breast: Secondary | ICD-10-CM | POA: Diagnosis not present

## 2023-01-28 LAB — HEMOCCULT GUIAC POC 1CARD (OFFICE): Fecal Occult Blood, POC: NEGATIVE

## 2023-01-28 NOTE — Progress Notes (Addendum)
Patient ID: Emily Chan, female   DOB: 1980/10/23, 42 y.o.   MRN: [id number] History of Present Illness: Emily Chan is a 42 year old white female,married, [id number] in for a well woman gyn exam.     Component Value Date/Time   DIAGPAP  01/22/2022 1212    - Negative for Intraepithelial Lesions or Malignancy (NILM)   DIAGPAP - Benign reactive/reparative changes 01/22/2022 1212   DIAGPAP  01/12/2019 1223    - Negative for intraepithelial lesion or malignancy (NILM)   HPVHIGH Negative 01/22/2022 1212   HPVHIGH Negative 01/12/2019 1223   ADEQPAP  01/22/2022 1212    Satisfactory for evaluation; transformation zone component PRESENT.   ADEQPAP  01/12/2019 1223    Satisfactory for evaluation; transformation zone component PRESENT.   ADEQPAP  12/30/2017 0000    Satisfactory for evaluation  endocervical/transformation zone component PRESENT.    PCP is Dr Alvy Bimler.   Current Medications, Allergies, Past Medical History, Past Surgical History, Family History and Social History were reviewed in [employer] record.     Review of Systems: Patient denies any headaches, hearing loss, fatigue, blurred vision, shortness of breath, chest pain, abdominal pain, problems with bowel movements, urination, or intercourse. No joint pain or mood swings.  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  The  vagina is normal in appearance. Urethra has no lesions or masses. The cervix is bulbous, and everted at os, +IUD strings at os.  Uterus is felt to be normal size, shape, and contour.  No adnexal masses or tenderness noted.Bladder is non tender, no masses felt. Rectal: Good sphincter tone, no polyps, or hemorrhoids felt.  Hemoccult negative. Extremities/musculoskeletal:  No swelling or varicosities noted, no clubbing or cyanosis, had mole removed posterior left thigh Psych:  No mood changes, alert and cooperative,seems happy AA is 3 Fall risk is low    01/28/2023   11:21 AM 11/27/2022    2:38 PM 05/22/2022    2:42 PM  Depression screen PHQ 2/9  Decreased Interest 0 0 0  Down, Depressed, Hopeless 0 0 0  PHQ - 2 Score 0 0 0  Altered sleeping 0 1   Tired, decreased energy 1 1   Change in appetite 0 0   Feeling bad or failure about yourself  0 0   Trouble concentrating 0 0   Moving slowly or fidgety/restless 0 0   Suicidal thoughts 0 0   PHQ-9 Score 1 2   Difficult doing work/chores  Not difficult at all        01/28/2023   11:21 AM 11/27/2022    2:38 PM 01/22/2022   11:26 AM 01/19/2021    9:22 AM  GAD 7 : Generalized Anxiety Score  Nervous, Anxious, on Edge 1 1 0 0  Control/stop worrying 0 0 0 0  Worry too much - different things 0 1 0 0  Trouble relaxing 1 1 0  0  Restless 0 0 0 0  Easily annoyed or irritable 0 0 0 0  Afraid - awful might happen 0 1 0 0  Total GAD 7 Score 2 4 0 0  Anxiety Difficulty  Not difficult at all      Upstream - 01/28/23 1125       Pregnancy Intention Screening   Does the patient want to become pregnant in the next year? No    Does the patient's partner want to become pregnant in the next year? No    Would the patient like to discuss contraceptive options today? No      Contraception Wrap Up   Current Method IUD or IUS    End Method IUD or IUS    Contraception Counseling Provided Yes            Examination chaperoned by Malachy Mood LPN      Impression and Plan: 1. Encounter for well woman exam with routine gynecological exam Pap in 2026 Physical in 1 year Labs with PCP  2. IUD (intrauterine device) in place Liletta placed 02/23/20 No periods with IUD  3. Encounter for screening fecal occult blood testing Hemoccult was negative  - POCT occult blood stool  4. Screening mammogram for breast cancer Pt to call for appt - MM 3D SCREENING MAMMOGRAM BILATERAL BREAST; Future

## 2023-02-07 ENCOUNTER — Ambulatory Visit: Payer: Commercial Managed Care - PPO | Admitting: Gastroenterology

## 2023-02-07 ENCOUNTER — Encounter: Payer: Self-pay | Admitting: Gastroenterology

## 2023-02-07 VITALS — BP 112/70 | HR 84 | Ht 63.0 in | Wt 151.2 lb

## 2023-02-07 DIAGNOSIS — Z800 Family history of malignant neoplasm of digestive organs: Secondary | ICD-10-CM | POA: Diagnosis not present

## 2023-02-07 DIAGNOSIS — K625 Hemorrhage of anus and rectum: Secondary | ICD-10-CM | POA: Diagnosis not present

## 2023-02-07 MED ORDER — PLENVU 140 G PO SOLR: 1.0000 | Freq: Once | ORAL | 0 refills | Status: AC

## 2023-02-07 NOTE — Progress Notes (Signed)
Chief Complaint: rectal bleeding Primary GI MD: Gentry Fitz  HPI: 42 year old female with medical history as listed below presents for evaluation of rectal bleeding.  Discussed the use of AI scribe software for clinical note transcription with the patient, who gave verbal consent to proceed.  History of Present Illness   The patient, with a family history of colon cancer, presents with a chief complaint of blood in her stool. The patient's maternal uncle was diagnosed with colon cancer in 05-11-2022 and passed away in 10-10-2022. The patient's maternal grandmother also had colon cancer. Both relatives are from the patient's maternal side. The patient's uncle was 36 years old at the time of his death.  Following the uncle's death, the family was advised to undergo colonoscopy screening. The patient discussed this with her primary care physician during an annual physical. Shortly after this visit, the patient noticed blood in her stool on two occasions in September, prompting an expedited referral for colonoscopy.  The patient has a history of hemorrhoids, which have previously caused rectal bleeding. However, the patient did not feel that her hemorrhoids were flaring up during the recent episodes of rectal bleeding. The patient denies any abdominal pain, changes in bowel habits, or unplanned weight loss.  Rectal bleeding was both on the tissue paper and in the toilet.  Last occurrence was 2 weeks ago.  The patient also mentions a bulge in the abdominal area slightly above the navel which began after pregnancy, which was previously thought to be a gas bubble. The bulge becomes more prominent when the patient has gas. The patient has not experienced any pain or discomfort associated with this bulge.      Past Medical History:  Diagnosis Date   Abdominal cramping affecting pregnancy 08/24/2014   Abnormal Pap smear    Bleeding in early pregnancy 09/02/2014   Pelvic rest   Contraception management 06/16/2012    Contraceptive management 05/03/2015   Hemorrhoids 05/03/2015   History of abnormal cervical Pap smear 08/18/2015   Hx: UTI (urinary tract infection)    Miscarriage    Numbness in both legs 08/18/2015   Has numbness and tingling in both legs since epidural, on and off esp at night   Pregnant 01/28/2014   Vaginal irritation 12/25/2012   Vaginal Pap smear, abnormal    Yeast infection 12/25/2012    Past Surgical History:  Procedure Laterality Date   APPENDECTOMY     WISDOM TOOTH EXTRACTION      Current Outpatient Medications  Medication Sig Dispense Refill   Ferrous Sulfate (FE-CAPS PO) Take by mouth.     levonorgestrel (LILETTA, 52 MG,) 19.5 MCG/DAY IUD IUD 1 each by Intrauterine route once.     nortriptyline (PAMELOR) 50 MG capsule Take 1 capsule (50 mg total) by mouth at bedtime. 30 capsule 5   No current facility-administered medications for this visit.    Allergies as of 02/07/2023 - Review Complete 02/07/2023  Allergen Reaction Noted   Sulfa antibiotics Rash 06/15/2012    Family History  Problem Relation Age of Onset   Diabetes Maternal Grandmother    Hypertension Maternal Grandmother    Brain cancer Maternal Grandfather    Diabetes Maternal Grandfather    Lung cancer Maternal Grandfather    Breast cancer Paternal Grandmother    Colon cancer Maternal Uncle    Skin cancer Paternal Aunt        melenoma    Social History   Socioeconomic History   Marital status: Married    Spouse  name: Not on file   Number of children: 2   Years of education: Not on file   Highest education level: Doctorate  Occupational History   Not on file  Tobacco Use   Smoking status: Never   Smokeless tobacco: Never  Vaping Use   Vaping status: Never Used  Substance and Sexual Activity   Alcohol use: Yes    Comment: occ.   Drug use: No   Sexual activity: Not Currently    Birth control/protection: I.U.D.  Other Topics Concern   Not on file  Social History Narrative   Not on file    Social Determinants of Health   Financial Resource Strain: Low Risk  (01/28/2023)   Overall Financial Resource Strain (CARDIA)    Difficulty of Paying Living Expenses: Not hard at all  Food Insecurity: No Food Insecurity (01/28/2023)   Hunger Vital Sign    Worried About Running Out of Food in the Last Year: Never true    Ran Out of Food in the Last Year: Never true  Transportation Needs: No Transportation Needs (01/28/2023)   PRAPARE - Administrator, Civil Service (Medical): No    Lack of Transportation (Non-Medical): No  Physical Activity: Sufficiently Active (01/28/2023)   Exercise Vital Sign    Days of Exercise per Week: 6 days    Minutes of Exercise per Session: 30 min  Stress: No Stress Concern Present (01/28/2023)   [employer] of Occupational Health - Occupational Stress Questionnaire    Feeling of Stress : Only a little  Social Connections: Moderately Isolated (01/28/2023)   Social Connection and Isolation Panel [NHANES]    Frequency of Communication with Friends and Family: More than three times a week    Frequency of Social Gatherings with Friends and Family: Three times a week    Attends Religious Services: Never    Active Member of Clubs or Organizations: No    Attends Banker Meetings: Never    Marital Status: Married  Catering manager Violence: Not At Risk (01/28/2023)   Humiliation, Afraid, Rape, and Kick questionnaire    Fear of Current or Ex-Partner: No    Emotionally Abused: No    Physically Abused: No    Sexually Abused: No    Review of Systems:    Constitutional: No weight loss, fever, chills, weakness or fatigue HEENT: Eyes: No change in vision               Ears, Nose, Throat:  No change in hearing or congestion Skin: No rash or itching Cardiovascular: No chest pain, chest pressure or palpitations   Respiratory: No SOB or cough Gastrointestinal: See HPI and otherwise negative Genitourinary: No dysuria or change in urinary  frequency Neurological: No headache, dizziness or syncope Musculoskeletal: No new muscle or joint pain Hematologic: No bleeding or bruising Psychiatric: No history of depression or anxiety    Physical Exam:  Vital signs: BP 112/70   Pulse 84   Ht 5\' 3"  (1.6 m)   Wt 151 lb 4 oz (68.6 kg)   BMI 26.79 kg/m   Constitutional: NAD, Well developed, Well nourished, alert and cooperative Head:  Normocephalic and atraumatic. Eyes:   PEERL, EOMI. No icterus. Conjunctiva pink. Respiratory: Respirations even and unlabored. Lungs clear to auscultation bilaterally.   No wheezes, crackles, or rhonchi.  Cardiovascular:  Regular rate and rhythm. No peripheral edema, cyanosis or pallor.  Gastrointestinal:  Soft, nondistended, nontender. No rebound or guarding.  Small ventral hernia palpated.  Normal bowel sounds. No appreciable masses or hepatomegaly. Rectal:  Not performed.  Msk:  Symmetrical without gross deformities. Without edema, no deformity or joint abnormality.  Neurologic:  Alert and  oriented x4;  grossly normal neurologically.  Skin:   Dry and intact without significant lesions or rashes. Psychiatric: Oriented to person, place and time. Demonstrates good judgement and reason without abnormal affect or behaviors.   RELEVANT LABS AND IMAGING: CBC    Component Value Date/Time   WBC 7.7 11/27/2022 1517   RBC 4.71 11/27/2022 1517   HGB 13.4 11/27/2022 1517   HGB 13.1 03/22/2020 0922   HCT 40.9 11/27/2022 1517   HCT 39.7 03/22/2020 0922   PLT 216.0 11/27/2022 1517   PLT 183 03/22/2020 0922   MCV 86.8 11/27/2022 1517   MCV 82 03/22/2020 0922   MCH 26.9 03/22/2020 0922   MCH 32.0 10/23/2017 0030   MCHC 32.8 11/27/2022 1517   RDW 13.2 11/27/2022 1517   RDW 19.7 (H) 03/22/2020 0922   LYMPHSABS 2.1 11/27/2022 1517   LYMPHSABS 2.3 03/28/2017 1542   MONOABS 0.5 11/27/2022 1517   EOSABS 0.1 11/27/2022 1517   EOSABS 0.1 03/28/2017 1542   BASOSABS 0.0 11/27/2022 1517   BASOSABS 0.0  03/28/2017 1542    CMP     Component Value Date/Time   NA 139 11/27/2022 1517   K 4.0 11/27/2022 1517   CL 102 11/27/2022 1517   CO2 29 11/27/2022 1517   GLUCOSE 80 11/27/2022 1517   BUN 17 11/27/2022 1517   CREATININE 0.86 11/27/2022 1517   CALCIUM 9.9 11/27/2022 1517   PROT 7.5 11/27/2022 1517   ALBUMIN 4.4 11/27/2022 1517   AST 24 11/27/2022 1517   ALT 23 11/27/2022 1517   ALKPHOS 44 11/27/2022 1517   BILITOT 0.9 11/27/2022 1517   GFRNONAA >60 08/29/2008 1448   GFRAA  08/29/2008 1448    >60        The eGFR has been calculated using the MDRD equation. This calculation has not been validated in all clinical situations. eGFR's persistently <60 mL/min signify possible Chronic Kidney Disease.     Assessment/Plan:      Rectal Bleeding Likely secondary to hemorrhoids, but with recent episodes of bleeding and family history of colon cancer, further investigation is warranted. -Schedule colonoscopy to evaluate for potential polyps or other causes of bleeding.  Family History of Colon Cancer Maternal uncle and grandmother both had colon cancer. Uncle was diagnosed at age 16. -Plan for regular colonoscopies every five years until age 71, given family history.  Possible Umbilical Hernia Patient reports a "bubble" above the belly button that comes and goes, likely a hernia related to previous pregnancies. Palpated on PE -No intervention needed at this time unless it becomes painful or does not retract.  Hemorrhoids History of hemorrhoids, particularly during pregnancy. Currently not causing significant discomfort. -Consider banding for internal hemorrhoids if found during colonoscopy. -Advise over-the-counter treatments and lifestyle modifications (e.g., squatty potty) for symptom management.      Lara Mulch [facility] 02/07/2023, 2:02 PM  Cc: Georgina Quint, *

## 2023-02-07 NOTE — Patient Instructions (Signed)
You have been scheduled for a colonoscopy. Please follow written instructions given to you at your visit today.   Please pick up your prep supplies at the pharmacy within the next 1-3 days.  If you use inhalers (even only as needed), please bring them with you on the day of your procedure.  DO NOT TAKE 7 DAYS PRIOR TO TEST- Trulicity (dulaglutide) Ozempic, Wegovy (semaglutide) Mounjaro (tirzepatide) Bydureon Bcise (exanatide extended release)  DO NOT TAKE 1 DAY PRIOR TO YOUR TEST Rybelsus (semaglutide) Adlyxin (lixisenatide) Victoza (liraglutide) Byetta (exanatide) ___________________________________________________________________________  _______________________________________________________  If your blood pressure at your visit was 140/90 or greater, please contact your primary care physician to follow up on this.  _______________________________________________________  If you are age 41 or older, your body mass index should be between 23-30. Your Body mass index is 26.79 kg/m. If this is out of the aforementioned range listed, please consider follow up with your Primary Care Provider.  If you are age 58 or younger, your body mass index should be between 19-25. Your Body mass index is 26.79 kg/m. If this is out of the aformentioned range listed, please consider follow up with your Primary Care Provider.   ________________________________________________________  The [facility] providers would like to encourage you to use [facility] to communicate with providers for non-urgent requests or questions.  Due to long hold times on the telephone, sending your provider a message by [facility] may be a faster and more efficient way to get a response.  Please allow 48 business hours for a response.  Please remember that this is for non-urgent requests.  _______________________________________________________

## 2023-02-09 NOTE — Progress Notes (Signed)
Agree with assessment/plan.  Raj Florestine Carmical, MD [facility] [phone]  

## 2023-02-11 ENCOUNTER — Encounter: Payer: Self-pay | Admitting: Gastroenterology

## 2023-02-14 ENCOUNTER — Encounter: Payer: Self-pay | Admitting: Gastroenterology

## 2023-02-14 ENCOUNTER — Ambulatory Visit: Payer: Commercial Managed Care - PPO | Admitting: Gastroenterology

## 2023-02-14 VITALS — BP 120/79 | HR 78 | Temp 98.6°F | Resp 20 | Ht 63.0 in | Wt 151.0 lb

## 2023-02-14 DIAGNOSIS — K625 Hemorrhage of anus and rectum: Secondary | ICD-10-CM

## 2023-02-14 MED ORDER — SODIUM CHLORIDE 0.9 % IV SOLN: 500.0000 mL | INTRAVENOUS | Status: DC

## 2023-02-14 MED ORDER — HYDROCORTISONE (PERIANAL) 2.5 % EX CREA: 1.0000 | TOPICAL_CREAM | Freq: Two times a day (BID) | CUTANEOUS | 0 refills | Status: AC

## 2023-02-14 NOTE — Progress Notes (Signed)
To pacu, VSS. Report to [email or web address] 

## 2023-02-14 NOTE — Progress Notes (Signed)
Chief Complaint: rectal bleeding Primary GI MD: Gentry Fitz   HPI: 42 year old female with medical history as listed below presents for evaluation of rectal bleeding.   Discussed the use of AI scribe software for clinical note transcription with the patient, who gave verbal consent to proceed.   History of Present Illness   The patient, with a family history of colon cancer, presents with a chief complaint of blood in her stool. The patient's maternal uncle was diagnosed with colon cancer in 2022-05-08 and passed away in 07-Oct-2022. The patient's maternal grandmother also had colon cancer. Both relatives are from the patient's maternal side. The patient's uncle was 5 years old at the time of his death.   Following the uncle's death, the family was advised to undergo colonoscopy screening. The patient discussed this with her primary care physician during an annual physical. Shortly after this visit, the patient noticed blood in her stool on two occasions in September, prompting an expedited referral for colonoscopy.   The patient has a history of hemorrhoids, which have previously caused rectal bleeding. However, the patient did not feel that her hemorrhoids were flaring up during the recent episodes of rectal bleeding. The patient denies any abdominal pain, changes in bowel habits, or unplanned weight loss.  Rectal bleeding was both on the tissue paper and in the toilet.  Last occurrence was 2 weeks ago.   The patient also mentions a bulge in the abdominal area slightly above the navel which began after pregnancy, which was previously thought to be a gas bubble. The bulge becomes more prominent when the patient has gas. The patient has not experienced any pain or discomfort associated with this bulge.           Past Medical History:  Diagnosis Date   Abdominal cramping affecting pregnancy 08/24/2014   Abnormal Pap smear     Bleeding in early pregnancy 09/02/2014    Pelvic rest   Contraception  management 06/16/2012   Contraceptive management 05/03/2015   Hemorrhoids 05/03/2015   History of abnormal cervical Pap smear 08/18/2015   Hx: UTI (urinary tract infection)     Miscarriage     Numbness in both legs 08/18/2015    Has numbness and tingling in both legs since epidural, on and off esp at night   Pregnant 01/28/2014   Vaginal irritation 12/25/2012   Vaginal Pap smear, abnormal     Yeast infection 12/25/2012               Past Surgical History:  Procedure Laterality Date   APPENDECTOMY       WISDOM TOOTH EXTRACTION                    Current Outpatient Medications  Medication Sig Dispense Refill   Ferrous Sulfate (FE-CAPS PO) Take by mouth.       levonorgestrel (LILETTA, 52 MG,) 19.5 MCG/DAY IUD IUD 1 each by Intrauterine route once.       nortriptyline (PAMELOR) 50 MG capsule Take 1 capsule (50 mg total) by mouth at bedtime. 30 capsule 5      No current facility-administered medications for this visit.             Allergies as of 02/07/2023 - Review Complete 02/07/2023  Allergen Reaction Noted   Sulfa antibiotics Rash 06/15/2012           Family History  Problem Relation Age of Onset   Diabetes Maternal Grandmother     Hypertension  Maternal Grandmother     Brain cancer Maternal Grandfather     Diabetes Maternal Grandfather     Lung cancer Maternal Grandfather     Breast cancer Paternal Grandmother     Colon cancer Maternal Uncle     Skin cancer Paternal Aunt          melenoma          Social History         Socioeconomic History   Marital status: Married      Spouse name: Not on file   Number of children: 2   Years of education: Not on file   Highest education level: Doctorate  Occupational History   Not on file  Tobacco Use   Smoking status: Never   Smokeless tobacco: Never  Vaping Use   Vaping status: Never Used  Substance and Sexual Activity   Alcohol use: Yes      Comment: occ.   Drug use: No   Sexual activity: Not Currently       Birth control/protection: I.U.D.  Other Topics Concern   Not on file  Social History Narrative   Not on file    Social Determinants of Health        Financial Resource Strain: Low Risk  (01/28/2023)    Overall Financial Resource Strain (CARDIA)     Difficulty of Paying Living Expenses: Not hard at all  Food Insecurity: No Food Insecurity (01/28/2023)    Hunger Vital Sign     Worried About Running Out of Food in the Last Year: Never true     Ran Out of Food in the Last Year: Never true  Transportation Needs: No Transportation Needs (01/28/2023)    PRAPARE - Therapist, art (Medical): No     Lack of Transportation (Non-Medical): No  Physical Activity: Sufficiently Active (01/28/2023)    Exercise Vital Sign     Days of Exercise per Week: 6 days     Minutes of Exercise per Session: 30 min  Stress: No Stress Concern Present (01/28/2023)    [employer] of Occupational Health - Occupational Stress Questionnaire     Feeling of Stress : Only a little  Social Connections: Moderately Isolated (01/28/2023)    Social Connection and Isolation Panel [NHANES]     Frequency of Communication with Friends and Family: More than three times a week     Frequency of Social Gatherings with Friends and Family: Three times a week     Attends Religious Services: Never     Active Member of Clubs or Organizations: No     Attends Banker Meetings: Never     Marital Status: Married  Catering manager Violence: Not At Risk (01/28/2023)    Humiliation, Afraid, Rape, and Kick questionnaire     Fear of Current or Ex-Partner: No     Emotionally Abused: No     Physically Abused: No     Sexually Abused: No      Review of Systems:    Constitutional: No weight loss, fever, chills, weakness or fatigue HEENT: Eyes: No change in vision               Ears, Nose, Throat:  No change in hearing or congestion Skin: No rash or itching Cardiovascular: No chest pain, chest pressure  or palpitations   Respiratory: No SOB or cough Gastrointestinal: See HPI and otherwise negative Genitourinary: No dysuria or change in urinary frequency Neurological: No headache,  dizziness or syncope Musculoskeletal: No new muscle or joint pain Hematologic: No bleeding or bruising Psychiatric: No history of depression or anxiety      Physical Exam:  Vital signs: BP 112/70   Pulse 84   Ht 5\' 3"  (1.6 m)   Wt 151 lb 4 oz (68.6 kg)   BMI 26.79 kg/m    Constitutional: NAD, Well developed, Well nourished, alert and cooperative Head:  Normocephalic and atraumatic. Eyes:   PEERL, EOMI. No icterus. Conjunctiva pink. Respiratory: Respirations even and unlabored. Lungs clear to auscultation bilaterally.   No wheezes, crackles, or rhonchi.  Cardiovascular:  Regular rate and rhythm. No peripheral edema, cyanosis or pallor.  Gastrointestinal:  Soft, nondistended, nontender. No rebound or guarding.  Small ventral hernia palpated.  Normal bowel sounds. No appreciable masses or hepatomegaly. Rectal:  Not performed.  Msk:  Symmetrical without gross deformities. Without edema, no deformity or joint abnormality.  Neurologic:  Alert and  oriented x4;  grossly normal neurologically.  Skin:   Dry and intact without significant lesions or rashes. Psychiatric: Oriented to person, place and time. Demonstrates good judgement and reason without abnormal affect or behaviors.     RELEVANT LABS AND IMAGING: CBC Labs (Brief)          Component Value Date/Time    WBC 7.7 11/27/2022 1517    RBC 4.71 11/27/2022 1517    HGB 13.4 11/27/2022 1517    HGB 13.1 03/22/2020 0922    HCT 40.9 11/27/2022 1517    HCT 39.7 03/22/2020 0922    PLT 216.0 11/27/2022 1517    PLT 183 03/22/2020 0922    MCV 86.8 11/27/2022 1517    MCV 82 03/22/2020 0922    MCH 26.9 03/22/2020 0922    MCH 32.0 10/23/2017 0030    MCHC 32.8 11/27/2022 1517    RDW 13.2 11/27/2022 1517    RDW 19.7 (H) 03/22/2020 0922    LYMPHSABS 2.1  11/27/2022 1517    LYMPHSABS 2.3 03/28/2017 1542    MONOABS 0.5 11/27/2022 1517    EOSABS 0.1 11/27/2022 1517    EOSABS 0.1 03/28/2017 1542    BASOSABS 0.0 11/27/2022 1517    BASOSABS 0.0 03/28/2017 1542        CMP     Labs (Brief)           Component Value Date/Time    NA 139 11/27/2022 1517    K 4.0 11/27/2022 1517    CL 102 11/27/2022 1517    CO2 29 11/27/2022 1517    GLUCOSE 80 11/27/2022 1517    BUN 17 11/27/2022 1517    CREATININE 0.86 11/27/2022 1517    CALCIUM 9.9 11/27/2022 1517    PROT 7.5 11/27/2022 1517    ALBUMIN 4.4 11/27/2022 1517    AST 24 11/27/2022 1517    ALT 23 11/27/2022 1517    ALKPHOS 44 11/27/2022 1517    BILITOT 0.9 11/27/2022 1517    GFRNONAA >60 08/29/2008 1448    GFRAA   08/29/2008 1448      >60        The eGFR has been calculated using the MDRD equation. This calculation has not been validated in all clinical situations. eGFR's persistently <60 mL/min signify possible Chronic Kidney Disease.          Assessment/Plan:       Rectal Bleeding Likely secondary to hemorrhoids, but with recent episodes of bleeding and family history of colon cancer, further investigation is warranted. -Schedule colonoscopy to evaluate  for potential polyps or other causes of bleeding.   Family History of Colon Cancer Maternal uncle and grandmother both had colon cancer. Uncle was diagnosed at age 65. -Plan for regular colonoscopies every five years until age 54, given family history.   Possible Umbilical Hernia Patient reports a "bubble" above the belly button that comes and goes, likely a hernia related to previous pregnancies. Palpated on PE -No intervention needed at this time unless it becomes painful or does not retract.   Hemorrhoids History of hemorrhoids, particularly during pregnancy. Currently not causing significant discomfort. -Consider banding for internal hemorrhoids if found during colonoscopy. -Advise over-the-counter treatments and  lifestyle modifications (e.g., squatty potty) for symptom management.       Boone Master, PA-C   Attending physician's note   I have taken history, reviewed the chart and examined the patient. I performed a substantive portion of this encounter, including complete performance of at least one of the key components, in conjunction with the APP. I agree with the Advanced Practitioner's note, impression and recommendations.   For colon today Heme neg Hb 13.4   Edman Circle, MD [facility] [phone]

## 2023-02-14 NOTE — Patient Instructions (Addendum)
Recommendation:- Patient has a contact number available for                            emergencies. The signs and symptoms of potential                            delayed complications were discussed with the                            patient. Return to normal activities tomorrow.                            Written discharge instructions were provided to the                            patient.                           - High fiber diet.                           - Continue present medications.                           - Use HC Cream 2.5%: Apply externally BID PRN for                            10 days. 2RF                           - Repeat colonoscopy at age 26 (d/t multiple                            second-degree relatives with colon cancer) for                            screening purposes.                           - The findings and recommendations were discussed                            with the patient's family.  YOU HAD AN ENDOSCOPIC PROCEDURE TODAY AT THE [facility]:   Refer to the procedure report that was given to you for any specific questions about what was found during the examination.  If the procedure report does not answer your questions, please call your gastroenterologist to clarify.  If you requested that your care partner not be given the details of your procedure findings, then the procedure report has been included in a sealed envelope for you to review at your convenience later.  YOU SHOULD EXPECT: Some feelings of bloating in the abdomen. Passage of more gas than usual.  Walking can help get rid of the air that was put into your GI tract during the procedure and reduce the bloating. If you had a lower endoscopy (such as a colonoscopy or flexible sigmoidoscopy) you may notice spotting  of blood in your stool or on the toilet paper. If you underwent a bowel prep for your procedure, you may not have a normal bowel movement for a few days.  Please Note:   You might notice some irritation and congestion in your nose or some drainage.  This is from the oxygen used during your procedure.  There is no need for concern and it should clear up in a day or so.  SYMPTOMS TO REPORT IMMEDIATELY:  Following lower endoscopy (colonoscopy or flexible sigmoidoscopy):  Excessive amounts of blood in the stool  Significant tenderness or worsening of abdominal pains  Swelling of the abdomen that is new, acute  Fever of 100F or higher  For urgent or emergent issues, a gastroenterologist can be reached at any hour by calling [phone]. Do not use MyChart messaging for urgent concerns.    DIET:  We do recommend a small meal at first, but then you may proceed to your regular diet.  Drink plenty of fluids but you should avoid alcoholic beverages for 24 hours.  ACTIVITY:  You should plan to take it easy for the rest of today and you should NOT DRIVE or use heavy machinery until tomorrow (because of the sedation medicines used during the test).    FOLLOW UP: Our staff will call the number listed on your records the next business day following your procedure.  We will call around 7:15- 8:00 am to check on you and address any questions or concerns that you may have regarding the information given to you following your procedure. If we do not reach you, we will leave a message.     If any biopsies were taken you will be contacted by phone or by letter within the next 1-3 weeks.  Please call us at [phone] if you have not heard about the biopsies in 3 weeks.    SIGNATURES/CONFIDENTIALITY: You and/or your care partner have signed paperwork which will be entered into your electronic medical record.  These signatures attest to the fact that that the information above on your After Visit Summary has been reviewed and is understood.  Full responsibility of the confidentiality of this discharge information lies with you and/or your care-partner.

## 2023-02-14 NOTE — Op Note (Signed)
[facility] Patient Name: Emily Chan Procedure Date: 02/14/2023 1:48 PM MRN: [id number] Endoscopist: Lynann Bologna , MD, [id number] Age: 42 Referring MD:  Date of Birth: 1981-02-10 Gender: Female Account #: [id number] Procedure:                Colonoscopy Indications:              Rectal bleeding with Nl Hb. FH CRC- GM and uncle. Medicines:                Monitored Anesthesia Care Procedure:                Pre-Anesthesia Assessment:                           - Prior to the procedure, a History and Physical                            was performed, and patient medications and                            allergies were reviewed. The patient's tolerance of                            previous anesthesia was also reviewed. The risks                            and benefits of the procedure and the sedation                            options and risks were discussed with the patient.                            All questions were answered, and informed consent                            was obtained. Prior Anticoagulants: The patient has                            taken no anticoagulant or antiplatelet agents. ASA                            Grade Assessment: II - A patient with mild systemic                            disease. After reviewing the risks and benefits,                            the patient was deemed in satisfactory condition to                            undergo the procedure.                           After obtaining informed consent, the colonoscope  was passed under direct vision. Throughout the                            procedure, the patient's blood pressure, pulse, and                            oxygen saturations were monitored continuously. The                            Olympus Scope SN [id number] was introduced through the                            anus and advanced to the 2 cm into the ileum. The                             colonoscopy was performed without difficulty. The                            patient tolerated the procedure well. The quality                            of the bowel preparation was good. The terminal                            ileum, ileocecal valve, appendiceal orifice, and                            rectum were photographed. Scope In: 2:00:37 PM Scope Out: 2:14:40 PM Scope Withdrawal Time: 0 hours 8 minutes 5 seconds  Total Procedure Duration: 0 hours 14 minutes 3 seconds  Findings:                 The colon (entire examined portion) appeared normal.                           Non-bleeding internal hemorrhoids were found during                            retroflexion. The hemorrhoids were moderate and                            Grade I (internal hemorrhoids that do not prolapse).                           The terminal ileum appeared normal.                           The exam was otherwise without abnormality on                            direct and retroflexion views. Complications:            No immediate complications. Estimated Blood Loss:     Estimated blood loss: none. Impression:               -  The entire examined colon is normal.                           - Non-bleeding internal hemorrhoids.                           - The examined portion of the ileum was normal.                           - The examination was otherwise normal on direct                            and retroflexion views.                           - No specimens collected. Recommendation:           - Patient has a contact number available for                            emergencies. The signs and symptoms of potential                            delayed complications were discussed with the                            patient. Return to normal activities tomorrow.                            Written discharge instructions were provided to the                            patient.                           - High  fiber diet.                           - Continue present medications.                           - Use HC Cream 2.5%: Apply externally BID PRN for                            10 days. 2RF                           - Repeat colonoscopy at age 41 (d/t multiple                            second-degree relatives with colon cancer) for                            screening purposes.                           - The findings and recommendations  were discussed                            with the patient's family. Lynann Bologna, MD 02/14/2023 2:19:07 PM This report has been signed electronically.

## 2023-02-15 ENCOUNTER — Telehealth: Payer: Self-pay

## 2023-02-15 NOTE — Telephone Encounter (Signed)
Spoke with pt.  Denies "actual pain; it mainly feels like gas bubbles."  She states she was able to pass air and had relief earlier today.  States she doesn't feel like eating.  I suggested she walk, drink warm fluids, sit on the toilet, get some Gas-X.  Understanding voiced and instructed to call back if she has further issues.

## 2023-02-15 NOTE — Telephone Encounter (Signed)
Follow up call to pt, lm for pt to call if having any difficulty with normal activities or eating and drinking.  Also to call if any other questions or concerns.  

## 2023-02-15 NOTE — Telephone Encounter (Signed)
PT returning call. She is in pain and has a lot of discomfort since procedure yesterday. She is unable to do her daily routine because of it. Please advise.

## 2023-02-20 NOTE — Progress Notes (Signed)
NEUROLOGY FOLLOW UP OFFICE NOTE  Paisleigh Kubena [id number]  Assessment/Plan:   Migraine without aura, without status migrainosus, not intractable   Migraine prevention:  Nortriptyline 50mg  at bedtime Migraine rescue:  Tylenol.  She has samples of Ubrelvy to try if needed. Consider magnesium citrate, CoQ10 and riboflavin (also consider Migrelief) Keep headache diary Limit use of pain relievers to no more than 2 days out of week to prevent risk of rebound or medication-overuse headache. Follow up 6 months.     Subjective:  Ariellah Dooney is a 42 year old right-handed female who follows up for headache  UPDATE: Increased nortriptyline. Doing better. She tried [place] once but doesn't remember how it worked.  She hasn't needed really needed.  Intensity:  4/10.  No longer wakes her up through the night. Duration:  no recent migraines during the day.  At night, takes Tylenol, goes to sleep and is fine in the morning.   Frequency:  2 a month Current NSAIDS/analgesics:  ibuprofen 800mg  Current triptans:  none Current ergotamine:  none Current anti-emetic:  none Current muscle relaxants:  cyclobenzaprine 10mg  Current Antihypertensive medications:  none Current Antidepressant medications:  nortriptyline 50mg  at bedtime  Current Anticonvulsant medications:  none Current anti-CGRP:  Ubrelvy 100mg  (samples) Current Vitamins/Herbal/Supplements:  none Current Antihistamines/Decongestants:  none Other therapy:  none Birth control:  Levonorgestrel  HISTORY:  On 2/10, she woke up with a severe headache and it has been daily ever since.  It is a pressure/squeezing pain on the anterior top of head, temples and behind the eyes.  Associated nausea.  On 3 occasions, she has noted floaters in her vision.  No associated vomiting, photophobia, or phonophobia.  It is not positional.  There is always a constant dull pressure but severity fluctuates.  May wake up with it or it may occur later in the  day.  It lasts up to 8 hours.  Tramadol helps.  Nurtec, sumatriptan, ibuprofen and Excedrin Migraine were ineffective.  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     No head trauma, illness, change in medications, change in sleep hygiene.  Only past history of headaches was during her pregnancy.     Past NSAIDS/analgesics:  naproxen, tramadol Past abortive triptans:  sumatriptan 100mg , rizatriptan Past abortive ergotamine:  none Past muscle relaxants:  tizanidine Past anti-emetic:  Zofran Past antihypertensive medications:  none Past antidepressant medications:  none Past anticonvulsant medications:  none Past anti-CGRP:  Nurtec Past vitamins/Herbal/Supplements:  none Past antihistamines/decongestants:  Benadryl Other past therapies:  none     PAST MEDICAL HISTORY: Past Medical History:  Diagnosis Date   Abdominal cramping affecting pregnancy 08/24/2014   Abnormal Pap smear    Bleeding in early pregnancy 09/02/2014   Pelvic rest   Contraception management 06/16/2012   Contraceptive management 05/03/2015   Hemorrhoids 05/03/2015   History of abnormal cervical Pap smear 08/18/2015   Hx: UTI (urinary tract infection)    Miscarriage    Numbness in both legs 08/18/2015   Has numbness and tingling in both legs since epidural, on and off esp at night   Pregnant 01/28/2014   Vaginal irritation 12/25/2012   Vaginal Pap smear, abnormal    Yeast infection 12/25/2012    MEDICATIONS: Current Outpatient Medications on File Prior to Visit  Medication Sig Dispense Refill   Ferrous Sulfate (FE-CAPS PO) Take by mouth. (Patient not taking: Reported on 02/14/2023)  hydrocortisone (ANUSOL-HC) 2.5 % rectal cream Place 1 Application rectally 2 (two) times daily for 10 days. HC cream 2.5% --Apply externally BID PRN for 10 days . 2 RF. 30 g 0   levonorgestrel (LILETTA, 52 MG,) 19.5 MCG/DAY IUD IUD 1  each by Intrauterine route once.     nortriptyline (PAMELOR) 50 MG capsule Take 1 capsule (50 mg total) by mouth at bedtime. 30 capsule 5   No current facility-administered medications on file prior to visit.    ALLERGIES: Allergies  Allergen Reactions   Sulfa Antibiotics Rash    FAMILY HISTORY: Family History  Problem Relation Age of Onset   Diabetes Maternal Grandmother    Hypertension Maternal Grandmother    Brain cancer Maternal Grandfather    Diabetes Maternal Grandfather    Lung cancer Maternal Grandfather    Breast cancer Paternal Grandmother    Colon cancer Maternal Uncle    Skin cancer Paternal Aunt        melenoma      Objective:  Blood pressure 114/75, pulse 85, height 5\' 3"  (1.6 m), weight 150 lb (68 kg), SpO2 100%. General: No acute distress.  Patient appears well-groomed.      Shon Millet, DO  CC: Edwina Barth, MD

## 2023-02-25 ENCOUNTER — Encounter: Payer: Self-pay | Admitting: Neurology

## 2023-02-25 ENCOUNTER — Ambulatory Visit (INDEPENDENT_AMBULATORY_CARE_PROVIDER_SITE_OTHER): Payer: Commercial Managed Care - PPO | Admitting: Neurology

## 2023-02-25 VITALS — BP 114/75 | HR 85 | Ht 63.0 in | Wt 150.0 lb

## 2023-02-25 DIAGNOSIS — G43009 Migraine without aura, not intractable, without status migrainosus: Secondary | ICD-10-CM

## 2023-02-25 NOTE — Patient Instructions (Signed)
Nortriptyline 50mg  at bedtime Tylenol if needed. May use Ubrelvy if needed as well

## 2023-03-04 ENCOUNTER — Ambulatory Visit (HOSPITAL_COMMUNITY)
Admission: RE | Admit: 2023-03-04 | Discharge: 2023-03-04 | Disposition: A | Discharge status: Home / Self Care | Payer: Commercial Managed Care - PPO | Source: Ambulatory Visit | Attending: Adult Health | Admitting: Adult Health

## 2023-03-04 DIAGNOSIS — Z1231 Encounter for screening mammogram for malignant neoplasm of breast: Secondary | ICD-10-CM | POA: Insufficient documentation

## 2023-03-06 ENCOUNTER — Ambulatory Visit: Payer: Commercial Managed Care - PPO | Admitting: Neurology

## 2023-03-07 ENCOUNTER — Ambulatory Visit: Payer: Commercial Managed Care - PPO | Admitting: Nurse Practitioner

## 2023-03-14 ENCOUNTER — Ambulatory Visit: Payer: Commercial Managed Care - PPO | Admitting: Neurology

## 2023-03-25 ENCOUNTER — Other Ambulatory Visit: Payer: Self-pay | Admitting: Neurology

## 2023-04-25 ENCOUNTER — Encounter: Payer: Self-pay | Admitting: Adult Health

## 2023-04-25 ENCOUNTER — Ambulatory Visit: Payer: Commercial Managed Care - PPO | Admitting: Adult Health

## 2023-04-25 VITALS — BP 133/80 | HR 85 | Ht 63.0 in | Wt 158.0 lb

## 2023-04-25 DIAGNOSIS — N6311 Unspecified lump in the right breast, upper outer quadrant: Secondary | ICD-10-CM

## 2023-04-25 NOTE — Progress Notes (Signed)
  Subjective:     Patient ID: Emily Chan, female   DOB: 1981-01-27, 43 y.o.   MRN: [id number]  HPI Emily Chan is a 43 year old white female, married, [id number] in complaining of right breast lump, first noticed about 2 weeks ago. Had negative mammogram 03/04/23.     Component Value Date/Time   DIAGPAP  01/22/2022 1212    - Negative for Intraepithelial Lesions or Malignancy (NILM)   DIAGPAP - Benign reactive/reparative changes 01/22/2022 1212   DIAGPAP  01/12/2019 1223    - Negative for intraepithelial lesion or malignancy (NILM)   HPVHIGH Negative 01/22/2022 1212   HPVHIGH Negative 01/12/2019 1223   ADEQPAP  01/22/2022 1212    Satisfactory for evaluation; transformation zone component PRESENT.   ADEQPAP  01/12/2019 1223    Satisfactory for evaluation; transformation zone component PRESENT.   ADEQPAP  12/30/2017 0000    Satisfactory for evaluation  endocervical/transformation zone component PRESENT.    PCP is Dr Alvy Bimler  Review of Systems Right breast mass, noticed about 2 weeks ago,non tender  Reviewed past medical,surgical, social and family history. Reviewed medications and allergies.     Objective:   Physical Exam BP 133/80 (BP Location: Left Arm, Patient Position: Sitting, Cuff Size: Normal)   Pulse 85   Ht 5\' 3"  (1.6 m)   Wt 158 lb (71.7 kg)   BMI 27.99 kg/m      Skin warm and dry,  Breasts:no dominate palpable mass, retraction or nipple discharge, on left, on the right, no retraction or nipple discharge, has BB sized mass at 10 0'clock near areola, non tender and non mobile.  [facility] - 04/25/23 0857       Pregnancy Intention Screening   Does the patient want to become pregnant in the next year? No    Does the patient's partner want to become pregnant in the next year? No    Would the patient like to discuss contraceptive options today? No      Contraception Wrap Up   Current Method IUD or IUS    End Method IUD or IUS    Contraception Counseling Provided Yes              Assessment:     1. Mass of upper outer quadrant of right breast (Primary) +BB sized mass at 10 0'clock near areola, non tender and non mobile.Noticed about 2 weeks ago, Rt diagnostic mammogram and [place] at [facility] 06/18/23 at 11 am, they will call if has sooner appt. I gave her number to call to check if has cancellation sooner  - [place] LIMITED ULTRASOUND INCLUDING AXILLA RIGHT BREAST; Future - MM 3D DIAGNOSTIC MAMMOGRAM UNILATERAL RIGHT BREAST; Future     Plan:     Follow up prn

## 2023-05-16 ENCOUNTER — Other Ambulatory Visit: Payer: Commercial Managed Care - PPO

## 2023-05-21 ENCOUNTER — Ambulatory Visit
Admission: RE | Admit: 2023-05-21 | Discharge: 2023-05-21 | Disposition: A | Discharge status: Home / Self Care | Payer: Commercial Managed Care - PPO | Source: Ambulatory Visit | Attending: Adult Health | Admitting: Adult Health

## 2023-05-21 DIAGNOSIS — N6311 Unspecified lump in the right breast, upper outer quadrant: Secondary | ICD-10-CM

## 2023-05-22 ENCOUNTER — Ambulatory Visit (INDEPENDENT_AMBULATORY_CARE_PROVIDER_SITE_OTHER): Payer: Commercial Managed Care - PPO | Admitting: Emergency Medicine

## 2023-05-22 ENCOUNTER — Encounter: Payer: Self-pay | Admitting: Emergency Medicine

## 2023-05-22 ENCOUNTER — Ambulatory Visit (INDEPENDENT_AMBULATORY_CARE_PROVIDER_SITE_OTHER): Payer: Commercial Managed Care - PPO

## 2023-05-22 VITALS — BP 108/72 | HR 105 | Temp 98.6°F | Ht 63.0 in | Wt 156.0 lb

## 2023-05-22 DIAGNOSIS — R6889 Other general symptoms and signs: Secondary | ICD-10-CM

## 2023-05-22 DIAGNOSIS — J988 Other specified respiratory disorders: Secondary | ICD-10-CM | POA: Diagnosis not present

## 2023-05-22 DIAGNOSIS — Z20828 Contact with and (suspected) exposure to other viral communicable diseases: Secondary | ICD-10-CM | POA: Diagnosis not present

## 2023-05-22 DIAGNOSIS — B9789 Other viral agents as the cause of diseases classified elsewhere: Secondary | ICD-10-CM | POA: Insufficient documentation

## 2023-05-22 NOTE — Progress Notes (Signed)
 Emily Chan 43 y.o.   Chief Complaint  Patient presents with   Cough    Patient states she's been having a cough for 4 days. Also fatigue chest congestion. Her kids tested positive for the flu last week. No fever, headache, no body aches. She did not get tested     HISTORY OF PRESENT ILLNESS: This is a 43 y.o. female complaining of flulike symptoms that started about 4 days ago Her 2 kids were recently diagnosed with influenza and started on Tamiflu Today she feels a lot better but still having some chest congestion and occasional difficulty breathing No other associated symptoms No other complaints or medical concerns today.  Cough Pertinent negatives include no chest pain, chills, fever, headaches, rash, sore throat or shortness of breath.     Prior to Admission medications   Medication Sig Start Date End Date Taking? Authorizing Provider  levonorgestrel (LILETTA, 52 MG,) 19.5 MCG/DAY IUD IUD 1 each by Intrauterine route once.   Yes [provider]  nortriptyline (PAMELOR) 50 MG capsule Take 1 capsule (50 mg total) by mouth at bedtime. 03/25/23  Yes Jaffe, Adam R, DO  Ferrous Sulfate (FE-CAPS PO) Take by mouth. 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   Mild dysplasia of cervix 07/16/2013    Past Medical History:  Diagnosis Date   Abdominal cramping affecting pregnancy 08/24/2014   Abnormal Pap smear    Bleeding in  early pregnancy 09/02/2014   Pelvic rest   Contraception management 06/16/2012   Contraceptive management 05/03/2015   Hemorrhoids 05/03/2015   History of abnormal cervical Pap smear 08/18/2015   Hx: UTI (urinary tract infection)    Miscarriage    Numbness in both legs 08/18/2015   Has numbness and tingling in both legs since epidural, on and off esp at night   Pregnant 01/28/2014   Vaginal irritation 12/25/2012   Vaginal Pap smear, abnormal    Yeast infection 12/25/2012    Past Surgical History:  Procedure Laterality Date   APPENDECTOMY     WISDOM TOOTH EXTRACTION      Social History   Socioeconomic History   Marital status: Married    Spouse name: Not on file   Number of children: 2   Years of education: Not on file   Highest education level: Doctorate  Occupational History   Not on file  Tobacco Use   Smoking status: Never   Smokeless tobacco: Never  Vaping Use   Vaping status: Never Used  Substance and Sexual Activity   Alcohol use: Yes    Comment: occ.   Drug use: No   Sexual activity: Not Currently    Birth control/protection: I.U.D.  Other Topics Concern   Not on file  Social History Narrative   Not on file   Social Drivers of Health   Financial Resource Strain: Low Risk  (05/21/2023)   Overall Financial Resource Strain (CARDIA)    Difficulty of Paying Living  Expenses: Not hard at all  Food Insecurity: No Food Insecurity (05/21/2023)   Hunger Vital Sign    Worried About Running Out of Food in the Last Year: Never true    Ran Out of Food in the Last Year: Never true  Transportation Needs: No Transportation Needs (05/21/2023)   PRAPARE - Administrator, Civil Service (Medical): No    Lack of Transportation (Non-Medical): No  Physical Activity: Sufficiently Active (05/21/2023)   Exercise Vital Sign    Days of Exercise per Week: 5 days    Minutes of Exercise per Session: 40 min  Stress: No Stress Concern Present (05/21/2023)   [employer] of  Occupational Health - Occupational Stress Questionnaire    Feeling of Stress : Only a little  Social Connections: Moderately Isolated (05/21/2023)   Social Connection and Isolation Panel [NHANES]    Frequency of Communication with Friends and Family: More than three times a week    Frequency of Social Gatherings with Friends and Family: More than three times a week    Attends Religious Services: Never    Database administrator or Organizations: No    Attends Banker Meetings: Never    Marital Status: Married  Catering manager Violence: Not At Risk (01/28/2023)   Humiliation, Afraid, Rape, and Kick questionnaire    Fear of Current or Ex-Partner: No    Emotionally Abused: No    Physically Abused: No    Sexually Abused: No    Family History  Problem Relation Age of Onset   Diabetes Maternal Grandmother    Hypertension Maternal Grandmother    Brain cancer Maternal Grandfather    Diabetes Maternal Grandfather    Lung cancer Maternal Grandfather    Breast cancer Paternal Grandmother    Colon cancer Maternal Uncle    Skin cancer Paternal Aunt        melenoma     Review of Systems  Constitutional: Negative.  Negative for chills and fever.  HENT:  Positive for congestion. Negative for sore throat.   Respiratory:  Positive for cough. Negative for sputum production and shortness of breath.   Cardiovascular: Negative.  Negative for chest pain and palpitations.  Gastrointestinal:  Negative for abdominal pain, diarrhea, nausea and vomiting.  Genitourinary: Negative.   Skin: Negative.  Negative for rash.  Neurological:  Negative for dizziness and headaches.  All other systems reviewed and are negative.   Vitals:   05/22/23 1600  BP: 108/72  Pulse: (!) 105  Temp: 98.6 F (37 C)  SpO2: 99%    Physical Exam Vitals reviewed.  Constitutional:      Appearance: Normal appearance.  HENT:     Head: Normocephalic.     Right Ear: Tympanic membrane, ear canal and external  ear normal.     Left Ear: Tympanic membrane, ear canal and external ear normal.     Mouth/Throat:     Mouth: Mucous membranes are moist.     Pharynx: Oropharynx is clear.  Eyes:     Extraocular Movements: Extraocular movements intact.     Conjunctiva/sclera: Conjunctivae normal.     Pupils: Pupils are equal, round, and reactive to light.  Cardiovascular:     Rate and Rhythm: Normal rate and regular rhythm.     Pulses: Normal pulses.     Heart sounds: Normal heart sounds.  Pulmonary:     Effort: Pulmonary effort is normal.     Breath sounds: Normal breath sounds.  Musculoskeletal:  Cervical back: No tenderness.  Lymphadenopathy:     Cervical: No cervical adenopathy.  Skin:    General: Skin is warm and dry.     Capillary Refill: Capillary refill takes less than 2 seconds.  Neurological:     General: No focal deficit present.     Mental Status: She is alert and oriented to person, place, and time.  Psychiatric:        Mood and Affect: Mood normal.        Behavior: Behavior normal.   DG Chest 2 View Result Date: 05/22/2023 CLINICAL DATA:  Flu like symptoms, influenza.  Cough and congestion. EXAM: CHEST - 2 VIEW COMPARISON:  None Available. FINDINGS: The cardiomediastinal contours are normal. The lungs are clear. Pulmonary vasculature is normal. No consolidation, pleural effusion, or pneumothorax. No acute osseous abnormalities are seen. IMPRESSION: Negative radiographs of the chest. Electronically Signed   By: Narda Rutherford M.D.   On: 05/22/2023 16:41      ASSESSMENT & PLAN: A total of 32 minutes was spent with the patient and counseling/coordination of care regarding preparing for this visit, review of most recent office visit notes, review of today's chest x-ray images, diagnosis of influenza and management, symptom management, prognosis, documentation and need for follow-up if no better or worse during the next several days.  Problem List Items Addressed This Visit        Respiratory   Viral respiratory infection - Primary   Most likely influenza given symptoms and exposure Given her recent exposure to children with influenza at home this symptoms are most likely secondary to influenza infection Clinically improving Does not feel the need to be tested since this is influenza until proven otherwise and she is getting clinically better Concerned about chest congestion and occasional shortness of breath No wheezing on physical examination.  Afebrile Recommend chest x-ray today        Other   Flu-like symptoms   Given her recent exposure to children with influenza at home this symptoms are most likely secondary to influenza infection Clinically improving Does not feel the need to be tested since this is influenza until proven otherwise and she is getting clinically better Concerned about chest congestion and occasional shortness of breath No wheezing on physical examination.  Afebrile Recommend chest x-ray today      Relevant Orders   DG Chest 2 View   Exposure to the flu   2 children at home tested positive for influenza Symptoms typical of influenza Clinically much improved Advised to rest and stay well-hydrated Symptom management discussed Advised to contact the office if no better or worse during the next several days      Relevant Orders   DG Chest 2 View   Patient Instructions  Influenza, Adult Influenza is also called the flu. It's an infection that affects your respiratory tract. This includes your nose, throat, windpipe, and lungs. The flu is contagious. This means it spreads easily from person to person. It causes symptoms that are like a cold. It can also cause a high fever and body aches. What are the causes? The flu is caused by the influenza virus. You can get it by: Breathing in droplets that are in the air after an infected person coughs or sneezes. Touching something that has the virus on it and then touching your mouth,  nose, or eyes. What increases the risk? You may be more likely to get the flu if: You don't wash your hands often. You're near  a lot of people during cold and flu season. You touch your mouth, eyes, or nose without washing your hands first. You don't get a flu shot each year. You may also be more at risk for the flu and serious problems, such as a lung infection called pneumonia, if: You're older than 65. You're pregnant. Your immune system is weak. Your immune system is your body's defense system. You have a long-term, or chronic, condition, such as: Heart, kidney, or lung disease. Diabetes. A liver disorder. Asthma. You're very overweight. You have anemia. This is when you don't have enough red blood cells in your body. What are the signs or symptoms? Flu symptoms often start all of a sudden. They may last 4-14 days and include: Fever and chills. Headaches, body aches, or muscle aches. Sore throat. Cough. Runny or stuffy nose. Discomfort in your chest. Not wanting to eat as much as normal. Feeling weak or tired. Feeling dizzy. Nausea or vomiting. How is this diagnosed? The flu may be diagnosed based on your symptoms and medical history. You may also have a physical exam. A swab may be taken from your nose or throat and tested for the virus. How is this treated? If the flu is found early, you can be treated with antiviral medicine. This may be given to you by mouth or through an IV. It can help you feel less sick and get better faster. Taking care of yourself at home can also help your symptoms get better. Your health care provider may tell you to: Take over-the-counter medicines. Drink lots of fluids. The flu often goes away on its own. If you have very bad symptoms or problems caused by the flu, you may need to be treated in a hospital. Follow these instructions at home: Activity Rest as needed. Get lots of sleep. Stay home from work or school as told by your  provider. Leave home only to go see your provider. Do not leave home for other reasons until you don't have a fever for 24 hours without taking medicine. Eating and drinking Take an oral rehydration solution (ORS). This is a drink that is sold at pharmacies and stores. Drink enough fluid to keep your pee pale yellow. Try to drink small amounts of clear fluids. These include water, ice chips, fruit juice mixed with water, and low-calorie sports drinks. Try to eat bland foods that are easy to digest. These include bananas, applesauce, rice, lean meats, toast, and crackers. Avoid drinks that have a lot of sugar or caffeine in them. These include energy drinks, regular sports drinks, and soda. Do not drink alcohol. Do not eat spicy or fatty foods. General instructions     Take your medicines only as told by your provider. Use a cool mist humidifier to add moisture to the air in your home. This can make it easier for you to breathe. You should also clean the humidifier every day. To do so: Empty the water. Pour clean water in. Cover your mouth and nose when you cough or sneeze. Wash your hands with soap and water often and for at least 20 seconds. It's extra important to do so after you cough or sneeze. If you can't use soap and water, use hand sanitizer. How is this prevented?  Get a flu shot every year. Ask your provider when you should get your flu shot. Stay away from people who are sick during fall and winter. Fall and winter are cold and flu season. Contact a health care  provider if: You get new symptoms. You have chest pain. You have watery poop, also called diarrhea. You have a fever. Your cough gets worse. You start to have more mucus. You feel like you may vomit, or you vomit. Get help right away if: You become short of breath or have trouble breathing. Your skin or nails turn blue. You have very bad pain or stiffness in your neck. You get a sudden headache or pain in your  face or ear. You vomit each time you eat or drink. These symptoms may be an emergency. Call 911 right away. Do not wait to see if the symptoms will go away. Do not drive yourself to the hospital. This information is not intended to replace advice given to you by your health care provider. Make sure you discuss any questions you have with your health care provider. Document Revised: 12/13/2022 Document Reviewed: 04/19/2022 Elsevier Patient Education  2024 Elsevier Inc.     Edwina Barth, MD [facility]

## 2023-05-22 NOTE — Assessment & Plan Note (Signed)
 Given her recent exposure to children with influenza at home this symptoms are most likely secondary to influenza infection Clinically improving Does not feel the need to be tested since this is influenza until proven otherwise and she is getting clinically better Concerned about chest congestion and occasional shortness of breath No wheezing on physical examination.  Afebrile Recommend chest x-ray today

## 2023-05-22 NOTE — Patient Instructions (Signed)

## 2023-05-22 NOTE — Assessment & Plan Note (Addendum)
 Most likely influenza given symptoms and exposure Given her recent exposure to children with influenza at home this symptoms are most likely secondary to influenza infection Clinically improving Does not feel the need to be tested since this is influenza until proven otherwise and she is getting clinically better Concerned about chest congestion and occasional shortness of breath No wheezing on physical examination.  Afebrile Recommend chest x-ray today

## 2023-05-22 NOTE — Assessment & Plan Note (Signed)
 2 children at home tested positive for influenza Symptoms typical of influenza Clinically much improved Advised to rest and stay well-hydrated Symptom management discussed Advised to contact the office if no better or worse during the next several days

## 2023-05-30 ENCOUNTER — Other Ambulatory Visit: Payer: Commercial Managed Care - PPO

## 2023-06-18 ENCOUNTER — Other Ambulatory Visit (HOSPITAL_COMMUNITY): Payer: Commercial Managed Care - PPO

## 2023-06-18 ENCOUNTER — Encounter (HOSPITAL_COMMUNITY): Payer: Commercial Managed Care - PPO

## 2023-07-28 ENCOUNTER — Other Ambulatory Visit: Payer: Self-pay | Admitting: Neurology

## 2023-08-23 NOTE — Progress Notes (Signed)
 NEUROLOGY FOLLOW UP OFFICE NOTE  [name] [id number]  Assessment/Plan:   Migraine without aura, without status migrainosus, not intractable   Migraine prevention:  Nortriptyline  50mg  at bedtime  Migraine rescue:  Tylenol .  She has samples of Ubrelvy to try if needed. Keep headache diary Limit use of pain relievers to no more than 9 days out of the month to prevent risk of rebound or medication-overuse headache. Follow up 1 year     Subjective:  Emily Chan is a 43 year old right-handed female who follows up for headache  UPDATE: Doing well.   Intensity:  4/10.  No longer wakes her up through the night. Duration:  Most occurred at night so she took with Tylenol  and went to bed.  Resolved in the morning.  Never tried the Ubrelvy samples yet.   Frequency:  5 since January  Had 2 episodes of dizziness.  She was walking and felt some spinning and wooziness.  Sat down and resolved after a few minutes.  No double vision, numbness or weakness.  She may have been hydrated.    Current NSAIDS/analgesics: none Current triptans:  none Current ergotamine:  none Current anti-emetic:  none Current muscle relaxants:  none Current Antihypertensive medications:  none Current Antidepressant medications:  nortriptyline  50mg  at bedtime  Current Anticonvulsant medications:  none Current anti-CGRP:  Ubrelvy 100mg  (samples) Current Vitamins/Herbal/Supplements:  none Current Antihistamines/Decongestants:  none Other therapy:  none Birth control:  Levonorgestrel   HISTORY:  On 2/10, she woke up with a severe headache and it has been daily ever since.  It is a pressure/squeezing pain on the anterior top of head, temples and behind the eyes.  Associated nausea.  On 3 occasions, she has noted floaters in her vision.  No associated vomiting, photophobia, or phonophobia.  It is not positional.  There is always a constant dull pressure but severity fluctuates.  May wake up with it or it may occur  later in the day.  It lasts up to 8 hours.  Tramadol  helps.  Nurtec, sumatriptan , ibuprofen  and Excedrin Migraine were ineffective.  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     No head trauma, illness, change in medications, change in sleep hygiene.  Only past history of headaches was during her pregnancy.     Past NSAIDS/analgesics:  naproxen , tramadol , ibuprofen  800mg  Past abortive triptans:  sumatriptan  100mg , rizatriptan  Past abortive ergotamine:  none Past muscle relaxants:  tizanidine , Flexeril  Past anti-emetic:  Zofran  Past antihypertensive medications:  none Past antidepressant medications:  none Past anticonvulsant medications:  none Past anti-CGRP:  Nurtec Past vitamins/Herbal/Supplements:  none Past antihistamines/decongestants:  Benadryl  Other past therapies:  none     PAST MEDICAL HISTORY: Past Medical History:  Diagnosis Date   Abdominal cramping affecting pregnancy 08/24/2014   Abnormal Pap smear    Bleeding in early pregnancy 09/02/2014   Pelvic rest   Contraception management 06/16/2012   Contraceptive management 05/03/2015   Hemorrhoids 05/03/2015   History of abnormal cervical Pap smear 08/18/2015   Hx: UTI (urinary tract infection)    Miscarriage    Numbness in both legs 08/18/2015   Has numbness and tingling in both legs since epidural, on and off esp at night   Pregnant 01/28/2014   Vaginal irritation 12/25/2012   Vaginal Pap smear, abnormal    Yeast infection 12/25/2012    MEDICATIONS: Current Outpatient Medications on File Prior to Visit  Medication Sig Dispense Refill   Ferrous Sulfate (FE-CAPS PO) Take by mouth. (Patient not taking: Reported on 05/22/2023)     levonorgestrel  (LILETTA , 52 MG,) 19.5 MCG/DAY IUD IUD 1 each by Intrauterine route once.     nortriptyline  (PAMELOR ) 50 MG capsule Take 1 capsule (50 mg total) by mouth at bedtime. 30 capsule 0    No current facility-administered medications on file prior to visit.    ALLERGIES: Allergies  Allergen Reactions   Sulfa Antibiotics Rash    FAMILY HISTORY: Family History  Problem Relation Age of Onset   Diabetes Maternal Grandmother    Hypertension Maternal Grandmother    Brain cancer Maternal Grandfather    Diabetes Maternal Grandfather    Lung cancer Maternal Grandfather    Breast cancer Paternal Grandmother    Colon cancer Maternal Uncle    Skin cancer Paternal Aunt        melenoma      Objective:  Blood pressure 116/77, pulse 80, height 5\' 3"  (1.6 m), weight 153 lb (69.4 kg), SpO2 98%. General: No acute distress.  Patient appears well-groomed.  Head:  Normocephalic/atraumatic Neck:  Supple.  No paraspinal tenderness.  Full range of motion. Heart:  Regular rate and rhythm. Neuro:  Alert and oriented.  Speech fluent and not dysarthric.  Language intact.  CN II-XII intact.  Bulk and tone normal.  Muscle strength 5/5 throughout.  Sensation to light touch intact.  Deep tendon reflexes 2+ throughout, toes downgoing.  Gait normal.  Romberg negative.     Janne Members, DO  CC: Maryagnes Small, MD

## 2023-08-26 ENCOUNTER — Encounter: Payer: Self-pay | Admitting: Neurology

## 2023-08-26 ENCOUNTER — Ambulatory Visit (INDEPENDENT_AMBULATORY_CARE_PROVIDER_SITE_OTHER): Payer: Self-pay | Admitting: Neurology

## 2023-08-26 VITALS — BP 116/77 | HR 80 | Ht 63.0 in | Wt 153.0 lb

## 2023-08-26 DIAGNOSIS — G43009 Migraine without aura, not intractable, without status migrainosus: Secondary | ICD-10-CM

## 2023-08-26 MED ORDER — NORTRIPTYLINE HCL 50 MG PO CAPS: 50.0000 mg | ORAL_CAPSULE | Freq: Every day | ORAL | 3 refills | Status: DC

## 2023-09-17 ENCOUNTER — Ambulatory Visit: Admitting: Emergency Medicine

## 2023-12-04 ENCOUNTER — Ambulatory Visit (INDEPENDENT_AMBULATORY_CARE_PROVIDER_SITE_OTHER): Admitting: Emergency Medicine

## 2023-12-04 ENCOUNTER — Ambulatory Visit: Payer: Self-pay | Admitting: Emergency Medicine

## 2023-12-04 VITALS — BP 104/80 | HR 92 | Temp 99.1°F | Ht 63.0 in | Wt 157.0 lb

## 2023-12-04 DIAGNOSIS — Z13228 Encounter for screening for other metabolic disorders: Secondary | ICD-10-CM | POA: Diagnosis not present

## 2023-12-04 DIAGNOSIS — Z23 Encounter for immunization: Secondary | ICD-10-CM | POA: Diagnosis not present

## 2023-12-04 DIAGNOSIS — Z13220 Encounter for screening for lipoid disorders: Secondary | ICD-10-CM | POA: Diagnosis not present

## 2023-12-04 DIAGNOSIS — Z130 Encounter for screening for diseases of the blood and blood-forming organs and certain disorders involving the immune mechanism: Secondary | ICD-10-CM | POA: Diagnosis not present

## 2023-12-04 DIAGNOSIS — Z1329 Encounter for screening for other suspected endocrine disorder: Secondary | ICD-10-CM

## 2023-12-04 DIAGNOSIS — Z0000 Encounter for general adult medical examination without abnormal findings: Secondary | ICD-10-CM | POA: Diagnosis not present

## 2023-12-04 LAB — LIPID PANEL
Cholesterol: 174 mg/dL (ref 0–200)
HDL: 74.1 mg/dL (ref >39.00)
LDL Cholesterol: 90 mg/dL (ref 0–99)
NonHDL: 100.07
Total CHOL/HDL Ratio: 2
Triglycerides: 48 mg/dL (ref 0.0–149.0)
VLDL: 9.6 mg/dL (ref 0.0–40.0)

## 2023-12-04 LAB — CBC WITH DIFFERENTIAL/PLATELET
Basophils Absolute: 0 K/uL (ref 0.0–0.1)
Basophils Relative: 0.5 % (ref 0.0–3.0)
Eosinophils Absolute: 0.1 K/uL (ref 0.0–0.7)
Eosinophils Relative: 0.9 % (ref 0.0–5.0)
HCT: 40.7 % (ref 36.0–46.0)
Hemoglobin: 13.9 g/dL (ref 12.0–15.0)
Lymphocytes Relative: 21.5 % (ref 12.0–46.0)
Lymphs Abs: 1.4 K/uL (ref 0.7–4.0)
MCHC: 34.1 g/dL (ref 30.0–36.0)
MCV: 84.7 fl (ref 78.0–100.0)
Monocytes Absolute: 0.5 K/uL (ref 0.1–1.0)
Monocytes Relative: 7.1 % (ref 3.0–12.0)
Neutro Abs: 4.5 K/uL (ref 1.4–7.7)
Neutrophils Relative %: 70 % (ref 43.0–77.0)
Platelets: 178 K/uL (ref 150.0–400.0)
RBC: 4.81 Mil/uL (ref 3.87–5.11)
RDW: 14.5 % (ref 11.5–15.5)
WBC: 6.5 K/uL (ref 4.0–10.5)

## 2023-12-04 LAB — COMPREHENSIVE METABOLIC PANEL WITH GFR
ALT: 15 U/L (ref 0–35)
AST: 19 U/L (ref 0–37)
Albumin: 4.6 g/dL (ref 3.5–5.2)
Alkaline Phosphatase: 35 U/L — ABNORMAL LOW (ref 39–117)
BUN: 15 mg/dL (ref 6–23)
CO2: 27 meq/L (ref 19–32)
Calcium: 9.6 mg/dL (ref 8.4–10.5)
Chloride: 104 meq/L (ref 96–112)
Creatinine, Ser: 0.8 mg/dL (ref 0.40–1.20)
GFR: 91.29 mL/min (ref >60.00)
Glucose, Bld: 82 mg/dL (ref 70–99)
Potassium: 4.2 meq/L (ref 3.5–5.1)
Sodium: 138 meq/L (ref 135–145)
Total Bilirubin: 0.7 mg/dL (ref 0.2–1.2)
Total Protein: 7.4 g/dL (ref 6.0–8.3)

## 2023-12-04 LAB — VITAMIN B12: Vitamin B-12: 465 pg/mL (ref 211–911)

## 2023-12-04 LAB — HEMOGLOBIN A1C: Hgb A1c MFr Bld: 5.5 % (ref 4.6–6.5)

## 2023-12-04 LAB — VITAMIN D 25 HYDROXY (VIT D DEFICIENCY, FRACTURES): VITD: 29.47 ng/mL — ABNORMAL LOW (ref 30.00–100.00)

## 2023-12-04 LAB — TSH: TSH: 1.13 u[IU]/mL (ref 0.35–5.50)

## 2023-12-04 NOTE — Progress Notes (Signed)
 Emily Chan 42 y.o.   Chief Complaint  Patient presents with   Annual Exam    Patient here for physical. No other concerns.    HISTORY OF PRESENT ILLNESS: This is a 43 y.o. female here for annual physical. Has no complaints or medical concerns today. Healthy lifestyle.  HPI   Prior to Admission medications   Medication Sig Start Date End Date Taking? Authorizing Provider  levonorgestrel  (LILETTA , 52 MG,) 19.5 MCG/DAY IUD IUD 1 each by Intrauterine route once.   Yes [provider]  nortriptyline  (PAMELOR ) 50 MG capsule Take 1 capsule (50 mg total) by mouth at bedtime. 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    Past Medical History:  Diagnosis Date   Abdominal cramping affecting pregnancy 08/24/2014   Abnormal Pap smear    Bleeding in early pregnancy 09/02/2014   Pelvic rest   Contraception management 06/16/2012   Contraceptive management 05/03/2015   Hemorrhoids 05/03/2015   History of abnormal cervical Pap smear 08/18/2015   Hx: UTI (urinary tract infection)    Miscarriage    Numbness in both legs 08/18/2015   Has numbness and tingling in both legs since epidural, on and off esp at night   Pregnant 01/28/2014   Vaginal irritation 12/25/2012   Vaginal Pap smear, abnormal    Yeast infection 12/25/2012    Past Surgical History:  Procedure Laterality Date   APPENDECTOMY     WISDOM TOOTH EXTRACTION      Social History   Socioeconomic History   Marital status: Married    Spouse name: Not on file   Number of children: 2   Years of education: Not on file   Highest education level: Doctorate  Occupational History   Not on file  Tobacco Use   Smoking status: Never    Smokeless tobacco: Never  Vaping Use   Vaping status: Never Used  Substance and Sexual Activity   Alcohol use: Yes    Comment: occ.   Drug use: No   Sexual activity: Not Currently    Birth control/protection: I.U.D.  Other Topics Concern   Not on file  Social History Narrative   Not on file   Social Drivers of Health   Financial Resource Strain: Low Risk  (12/04/2023)   Overall Financial Resource Strain (CARDIA)    Difficulty of Paying Living Expenses: Not hard at all  Food Insecurity: No Food Insecurity (12/04/2023)   Hunger Vital Sign    Worried About Running Out of Food in the Last Year: Never true    Ran Out of Food in the Last Year: Never true  Transportation Needs: No Transportation Needs (12/04/2023)   PRAPARE - Administrator, Civil Service (Medical): No    Lack of Transportation (Non-Medical): No  Physical Activity: Sufficiently Active (12/04/2023)   Exercise Vital Sign    Days of Exercise per Week: 6 days    Minutes of Exercise per Session: 30 min  Stress: No Stress Concern Present (12/04/2023)   [employer] of Occupational Health - Occupational Stress Questionnaire    Feeling of Stress: Only a little  Social Connections: Moderately Isolated (12/04/2023)   Social Connection and  Isolation Panel    Frequency of Communication with Friends and Family: More than three times a week    Frequency of Social Gatherings with Friends and Family: More than three times a week    Attends Religious Services: Never    Database administrator or Organizations: No    Attends Engineer, structural: Not on file    Marital Status: Married  Catering manager Violence: Not At Risk (01/28/2023)   Humiliation, Afraid, Rape, and Kick questionnaire    Fear of Current or Ex-Partner: No    Emotionally Abused: No    Physically Abused: No    Sexually Abused: No    Family History  Problem Relation Age of Onset   Diabetes Maternal Grandmother    Hypertension Maternal  Grandmother    Brain cancer Maternal Grandfather    Diabetes Maternal Grandfather    Lung cancer Maternal Grandfather    Breast cancer Paternal Grandmother    Colon cancer Maternal Uncle    Skin cancer Paternal Aunt        melenoma     Review of Systems  Constitutional: Negative.  Negative for chills and fever.  HENT: Negative.  Negative for congestion and sore throat.   Respiratory: Negative.  Negative for cough and shortness of breath.   Cardiovascular: Negative.  Negative for chest pain and palpitations.  Gastrointestinal:  Negative for abdominal pain, diarrhea, nausea and vomiting.  Genitourinary: Negative.  Negative for dysuria and hematuria.  Skin: Negative.  Negative for rash.  Neurological: Negative.  Negative for dizziness and headaches.  All other systems reviewed and are negative.   Vitals:   12/04/23 0958  BP: 104/80  Pulse: 92  Temp: 99.1 F (37.3 C)  SpO2: 98%    Physical Exam Vitals reviewed.  Constitutional:      Appearance: Normal appearance.  HENT:     Head: Normocephalic.     Right Ear: Tympanic membrane, ear canal and external ear normal.     Left Ear: Tympanic membrane, ear canal and external ear normal.     Mouth/Throat:     Mouth: Mucous membranes are moist.     Pharynx: Oropharynx is clear.  Eyes:     Extraocular Movements: Extraocular movements intact.     Pupils: Pupils are equal, round, and reactive to light.  Cardiovascular:     Rate and Rhythm: Normal rate and regular rhythm.     Pulses: Normal pulses.     Heart sounds: Normal heart sounds.  Pulmonary:     Effort: Pulmonary effort is normal.     Breath sounds: Normal breath sounds.  Abdominal:     Palpations: Abdomen is soft.     Tenderness: There is no abdominal tenderness.  Musculoskeletal:     Cervical back: No tenderness.  Lymphadenopathy:     Cervical: No cervical adenopathy.  Skin:    General: Skin is warm and dry.     Capillary Refill: Capillary refill takes less than 2  seconds.  Neurological:     General: No focal deficit present.     Mental Status: She is alert and oriented to person, place, and time.  Psychiatric:        Mood and Affect: Mood normal.        Behavior: Behavior normal.      ASSESSMENT & PLAN: Problem List Items Addressed This Visit   None Visit Diagnoses       Routine general medical examination at a health care facility    -  Primary   Relevant Orders   CBC with Differential/Platelet   Comprehensive metabolic panel with GFR   Hemoglobin A1c   Lipid panel   TSH   Vitamin B12   VITAMIN D  25 Hydroxy (Vit-D Deficiency, Fractures)     Need for vaccination       Relevant Orders   Flu vaccine trivalent PF, 6mos and older(Flulaval,Afluria,Fluarix,Fluzone) (Completed)     Screening for deficiency anemia       Relevant Orders   CBC with Differential/Platelet     Screening for lipoid disorders       Relevant Orders   Lipid panel     Screening for endocrine, metabolic and immunity disorder       Relevant Orders   Comprehensive metabolic panel with GFR   Hemoglobin A1c   TSH   Vitamin B12   VITAMIN D  25 Hydroxy (Vit-D Deficiency, Fractures)      Modifiable risk factors discussed with patient. Anticipatory guidance according to age provided. The following topics were also discussed: Social Determinants of Health Smoking.  Non-smoker Diet and nutrition.  Good eating habits Benefits of exercise.  Exercises regularly Cancer family history review Vaccinations review and recommendations Cardiovascular risk assessment and need for blood work Mental health including depression and anxiety Fall and accident prevention  Patient Instructions  Health Maintenance, Female Adopting a healthy lifestyle and getting preventive care are important in promoting health and wellness. Ask your health care provider about: The right schedule for you to have regular tests and exams. Things you can do on your own to prevent diseases and keep  yourself healthy. What should I know about diet, weight, and exercise? Eat a healthy diet  Eat a diet that includes plenty of vegetables, fruits, low-fat dairy products, and lean protein. Do not eat a lot of foods that are high in solid fats, added sugars, or sodium. Maintain a healthy weight Body mass index (BMI) is used to identify weight problems. It estimates body fat based on height and weight. Your health care provider can help determine your BMI and help you achieve or maintain a healthy weight. Get regular exercise Get regular exercise. This is one of the most important things you can do for your health. Most adults should: Exercise for at least 150 minutes each week. The exercise should increase your heart rate and make you sweat (moderate-intensity exercise). Do strengthening exercises at least twice a week. This is in addition to the moderate-intensity exercise. Spend less time sitting. Even light physical activity can be beneficial. Watch cholesterol and blood lipids Have your blood tested for lipids and cholesterol at 42 years of age, then have this test every 5 years. Have your cholesterol levels checked more often if: Your lipid or cholesterol levels are high. You are older than 42 years of age. You are at high risk for heart disease. What should I know about cancer screening? Depending on your health history and family history, you may need to have cancer screening at various ages. This may include screening for: Breast cancer. Cervical cancer. Colorectal cancer. Skin cancer. Lung cancer. What should I know about heart disease, diabetes, and high blood pressure? Blood pressure and heart disease High blood pressure causes heart disease and increases the risk of stroke. This is more likely to develop in people who have high blood pressure readings or are overweight. Have your blood pressure checked: Every 3-5 years if you are 47-67 years of age. Every year if you are 72  years old  or older. Diabetes Have regular diabetes screenings. This checks your fasting blood sugar level. Have the screening done: Once every three years after age 69 if you are at a normal weight and have a low risk for diabetes. More often and at a younger age if you are overweight or have a high risk for diabetes. What should I know about preventing infection? Hepatitis B If you have a higher risk for hepatitis B, you should be screened for this virus. Talk with your health care provider to find out if you are at risk for hepatitis B infection. Hepatitis C Testing is recommended for: Everyone born from [birth year] through 1964. Anyone with known risk factors for hepatitis C. Sexually transmitted infections (STIs) Get screened for STIs, including gonorrhea and chlamydia, if: You are sexually active and are younger than 42 years of age. You are older than 42 years of age and your health care provider tells you that you are at risk for this type of infection. Your sexual activity has changed since you were last screened, and you are at increased risk for chlamydia or gonorrhea. Ask your health care provider if you are at risk. Ask your health care provider about whether you are at high risk for HIV. Your health care provider may recommend a prescription medicine to help prevent HIV infection. If you choose to take medicine to prevent HIV, you should first get tested for HIV. You should then be tested every 3 months for as long as you are taking the medicine. Pregnancy If you are about to stop having your period (premenopausal) and you may become pregnant, seek counseling before you get pregnant. Take 400 to 800 micrograms (mcg) of folic acid every day if you become pregnant. Ask for birth control (contraception) if you want to prevent pregnancy. Osteoporosis and menopause Osteoporosis is a disease in which the bones lose minerals and strength with aging. This can result in bone fractures. If you are  65 years old or older, or if you are at risk for osteoporosis and fractures, ask your health care provider if you should: Be screened for bone loss. Take a calcium or vitamin D  supplement to lower your risk of fractures. Be given hormone replacement therapy (HRT) to treat symptoms of menopause. Follow these instructions at home: Alcohol use Do not drink alcohol if: Your health care provider tells you not to drink. You are pregnant, may be pregnant, or are planning to become pregnant. If you drink alcohol: Limit how much you have to: 0-1 drink a day. Know how much alcohol is in your drink. In the [place] one drink equals one 12 oz bottle of beer (355 mL), one 5 oz glass of wine (148 mL), or one 1 oz glass of hard liquor (44 mL). Lifestyle Do not use any products that contain nicotine or tobacco. These products include cigarettes, chewing tobacco, and vaping devices, such as e-cigarettes. If you need help quitting, ask your health care provider. Do not use street drugs. Do not share needles. Ask your health care provider for help if you need support or information about quitting drugs. General instructions Schedule regular health, dental, and eye exams. Stay current with your vaccines. Tell your health care provider if: You often feel depressed. You have ever been abused or do not feel safe at home. Summary Adopting a healthy lifestyle and getting preventive care are important in promoting health and wellness. Follow your health care provider's instructions about healthy diet, exercising, and getting tested or screened  for diseases. Follow your health care provider's instructions on monitoring your cholesterol and blood pressure. This information is not intended to replace advice given to you by your health care provider. Make sure you discuss any questions you have with your health care provider. Document Revised: 08/01/2020 Document Reviewed: 08/01/2020 Elsevier Patient Education  2024  Elsevier Inc.      Emil Schaumann, MD [facility]

## 2023-12-04 NOTE — Patient Instructions (Signed)

## 2024-01-07 ENCOUNTER — Encounter: Payer: Self-pay | Admitting: Neurology

## 2024-01-07 MED ORDER — PREDNISONE 10 MG (21) PO TBPK: ORAL_TABLET | ORAL | 0 refills | Status: AC

## 2024-01-21 MED ORDER — NORTRIPTYLINE HCL 75 MG PO CAPS: 75.0000 mg | ORAL_CAPSULE | Freq: Every day | ORAL | 5 refills | Status: AC

## 2024-01-21 NOTE — Addendum Note (Signed)
 Addended by: OZELL JESUSA PARAS on: 01/21/2024 03:15 PM   Modules accepted: Orders

## 2024-01-26 ENCOUNTER — Ambulatory Visit (HOSPITAL_COMMUNITY)

## 2024-01-27 ENCOUNTER — Ambulatory Visit

## 2024-01-30 ENCOUNTER — Encounter: Payer: Self-pay | Admitting: Women's Health

## 2024-01-30 ENCOUNTER — Ambulatory Visit (INDEPENDENT_AMBULATORY_CARE_PROVIDER_SITE_OTHER): Admitting: Women's Health

## 2024-01-30 VITALS — BP 135/85 | HR 105 | Ht 63.0 in | Wt 156.5 lb

## 2024-01-30 DIAGNOSIS — Z01419 Encounter for gynecological examination (general) (routine) without abnormal findings: Secondary | ICD-10-CM

## 2024-01-30 DIAGNOSIS — Z1231 Encounter for screening mammogram for malignant neoplasm of breast: Secondary | ICD-10-CM

## 2024-01-30 NOTE — Progress Notes (Signed)
 WELL-WOMAN EXAMINATION Patient name: Emily Chan MRN [id number]  Date of birth: [birth year] Chief Complaint:   Gynecologic Exam  History of Present Illness:   Emily Chan is a 43 y.o. [phone] Caucasian female being seen today for a routine well-woman exam.  Current complaints: none No periods on IUD  PCP: Sagardia      does not desire labs No LMP recorded. (Menstrual status: IUD). The current method of family planning is IUD. Liletta  inserted 02/23/20 Last pap 01/22/22. Results were: NILM w/ HRHPV negative. H/O abnormal pap: yes prior to kids Last mammogram: 05/21/23. Results were: 0.3cm intradermal cyst Rt breast. Family h/o breast cancer: yes PGM Last colonoscopy: 2024 d/t bleeding & family hx. Results were: normal. Family h/o colorectal cancer: yes MGM and MU     01/30/2024    8:34 AM 12/04/2023   10:17 AM 05/22/2023    4:12 PM 01/28/2023   11:21 AM 11/27/2022    2:38 PM  Depression screen PHQ 2/9  Decreased Interest 0 0 0 0 0  Down, Depressed, Hopeless 0 0 0 0 0  PHQ - 2 Score 0 0 0 0 0  Altered sleeping 0   0 1  Tired, decreased energy 1   1 1   Change in appetite 0   0 0  Feeling bad or failure about yourself  0   0 0  Trouble concentrating 0   0 0  Moving slowly or fidgety/restless    0 0  Suicidal thoughts 0   0 0  PHQ-9 Score 1   1 2   Difficult doing work/chores     Not difficult at all        01/30/2024    8:34 AM 01/28/2023   11:21 AM 11/27/2022    2:38 PM 01/22/2022   11:26 AM  GAD 7 : Generalized Anxiety Score  Nervous, Anxious, on Edge 1 1 1  0  Control/stop worrying 0 0 0 0  Worry too much - different things 0 0 1 0  Trouble relaxing 1 1 1  0  Restless 0 0 0 0  Easily annoyed or irritable 0 0 0 0  Afraid - awful might happen 0 0 1 0  Total GAD 7 Score 2 2 4  0  Anxiety Difficulty   Not difficult at all      Review of Systems:   Pertinent items are noted in HPI Denies any headaches, blurred vision, fatigue, shortness of breath, chest pain, abdominal  pain, abnormal vaginal discharge/itching/odor/irritation, problems with periods, bowel movements, urination, or intercourse unless otherwise stated above. Pertinent History Reviewed:  Reviewed past medical,surgical, social and family history.  Reviewed problem list, medications and allergies. Physical Assessment:   Vitals:   01/30/24 0832  BP: 135/85  Pulse: (!) 105  Weight: 156 lb 8 oz (71 kg)  Height: 5' 3 (1.6 m)  Body mass index is 27.72 kg/m.        Physical Examination:   General appearance - well appearing, and in no distress  Mental status - alert, oriented to person, place, and time  Psych:  She has a normal mood and affect  Skin - warm and dry, normal color, no suspicious lesions noted  Chest - effort normal, all lung fields clear to auscultation bilaterally  Heart - normal rate and regular rhythm  Neck:  midline trachea, no thyromegaly or nodules  Breasts - breasts appear normal, no suspicious masses, no skin or nipple changes or  axillary nodes  Abdomen - soft, nontender, nondistended,  no masses or organomegaly  Pelvic - VULVA: normal appearing vulva with no masses, tenderness or lesions  VAGINA: normal appearing vagina with normal color and discharge, no lesions  CERVIX: normal appearing cervix without discharge or lesions, no CMT, IUD strings visible, appropriate length  Thin prep pap is not done   UTERUS: uterus is felt to be normal size, shape, consistency and nontender   ADNEXA: No adnexal masses or tenderness noted.  Extremities:  No swelling or varicosities noted  Chaperone: Alan Fischer  No results found for this or any previous visit (from the past 24 hours).  Assessment & Plan:  1) Well-Woman Exam  Labs/procedures today: exam  Mammogram: in Feb, [facility], or sooner if problems Colonoscopy: per GI, or sooner if problems  Orders Placed This Encounter  Procedures   MM 3D SCREENING MAMMOGRAM BILATERAL BREAST    Meds: No  orders of the defined types were placed in this encounter.   Follow-up: Return in about 1 year (around 01/29/2025) for Physical.  Suzen JONELLE Fetters CNM, [facility] 01/30/2024 8:57 AM

## 2024-03-05 ENCOUNTER — Ambulatory Visit
Admission: RE | Admit: 2024-03-05 | Discharge: 2024-03-05 | Disposition: A | Discharge status: Home / Self Care | Source: Ambulatory Visit | Attending: Women's Health | Admitting: Women's Health

## 2024-03-05 DIAGNOSIS — Z1231 Encounter for screening mammogram for malignant neoplasm of breast: Secondary | ICD-10-CM

## 2024-08-24 ENCOUNTER — Ambulatory Visit: Admitting: Neurology
